# Patient Record
Sex: Female | Born: 1940
Health system: Southern US, Community
[De-identification: ages and names within clinical notes are randomized; demographics above are authoritative.]

## PROBLEM LIST (undated history)

## (undated) DIAGNOSIS — L02212 Cutaneous abscess of back [any part, except buttock]: Secondary | ICD-10-CM

## (undated) DIAGNOSIS — G459 Transient cerebral ischemic attack, unspecified: Secondary | ICD-10-CM

## (undated) DIAGNOSIS — H269 Unspecified cataract: Secondary | ICD-10-CM

## (undated) DIAGNOSIS — Z860101 Personal history of adenomatous and serrated colon polyps: Secondary | ICD-10-CM

## (undated) DIAGNOSIS — K37 Unspecified appendicitis: Secondary | ICD-10-CM

## (undated) DIAGNOSIS — K589 Irritable bowel syndrome without diarrhea: Secondary | ICD-10-CM

## (undated) DIAGNOSIS — I10 Essential (primary) hypertension: Secondary | ICD-10-CM

## (undated) DIAGNOSIS — L439 Lichen planus, unspecified: Secondary | ICD-10-CM

## (undated) DIAGNOSIS — Z87442 Personal history of urinary calculi: Secondary | ICD-10-CM

## (undated) DIAGNOSIS — I493 Ventricular premature depolarization: Secondary | ICD-10-CM

## (undated) DIAGNOSIS — M797 Fibromyalgia: Secondary | ICD-10-CM

## (undated) DIAGNOSIS — I6529 Occlusion and stenosis of unspecified carotid artery: Secondary | ICD-10-CM

## (undated) DIAGNOSIS — Z86010 Personal history of colonic polyps: Secondary | ICD-10-CM

## (undated) DIAGNOSIS — Z9289 Personal history of other medical treatment: Secondary | ICD-10-CM

## (undated) DIAGNOSIS — K5792 Diverticulitis of intestine, part unspecified, without perforation or abscess without bleeding: Secondary | ICD-10-CM

## (undated) DIAGNOSIS — I4729 Other ventricular tachycardia: Secondary | ICD-10-CM

## (undated) DIAGNOSIS — K279 Peptic ulcer, site unspecified, unspecified as acute or chronic, without hemorrhage or perforation: Secondary | ICD-10-CM

## (undated) DIAGNOSIS — B880 Other acariasis: Secondary | ICD-10-CM

## (undated) DIAGNOSIS — T7840XA Allergy, unspecified, initial encounter: Secondary | ICD-10-CM

## (undated) DIAGNOSIS — I340 Nonrheumatic mitral (valve) insufficiency: Secondary | ICD-10-CM

## (undated) DIAGNOSIS — I639 Cerebral infarction, unspecified: Secondary | ICD-10-CM

## (undated) DIAGNOSIS — K219 Gastro-esophageal reflux disease without esophagitis: Secondary | ICD-10-CM

## (undated) DIAGNOSIS — F419 Anxiety disorder, unspecified: Secondary | ICD-10-CM

## (undated) DIAGNOSIS — E785 Hyperlipidemia, unspecified: Secondary | ICD-10-CM

## (undated) DIAGNOSIS — Z87898 Personal history of other specified conditions: Secondary | ICD-10-CM

## (undated) DIAGNOSIS — E079 Disorder of thyroid, unspecified: Secondary | ICD-10-CM

## (undated) DIAGNOSIS — I351 Nonrheumatic aortic (valve) insufficiency: Secondary | ICD-10-CM

## (undated) HISTORY — DX: Diverticulitis of intestine, part unspecified, without perforation or abscess without bleeding: K57.92

## (undated) HISTORY — DX: Unspecified cataract: H26.9

## (undated) HISTORY — DX: Cutaneous abscess of back (any part, except buttock): L02.212

## (undated) HISTORY — DX: Essential (primary) hypertension: I10

## (undated) HISTORY — DX: Irritable bowel syndrome, unspecified: K58.9

## (undated) HISTORY — PX: WRIST SURGERY: SHX841

## (undated) HISTORY — DX: Personal history of adenomatous and serrated colon polyps: Z86.0101

## (undated) HISTORY — DX: Gastro-esophageal reflux disease without esophagitis: K21.9

## (undated) HISTORY — DX: Allergy, unspecified, initial encounter: T78.40XA

## (undated) HISTORY — PX: CARPAL TUNNEL RELEASE: SHX101

## (undated) HISTORY — DX: Personal history of other specified conditions: Z87.898

## (undated) HISTORY — DX: Fibromyalgia: M79.7

## (undated) HISTORY — DX: Disorder of thyroid, unspecified: E07.9

## (undated) HISTORY — DX: Ventricular premature depolarization: I49.3

## (undated) HISTORY — DX: Other acariasis: B88.0

## (undated) HISTORY — DX: Personal history of other medical treatment: Z92.89

## (undated) HISTORY — DX: Other ventricular tachycardia: I47.29

## (undated) HISTORY — DX: Personal history of colonic polyps: Z86.010

## (undated) HISTORY — DX: Hyperlipidemia, unspecified: E78.5

## (undated) HISTORY — DX: Unspecified appendicitis: K37

## (undated) HISTORY — PX: COLONOSCOPY: SHX174

## (undated) HISTORY — PX: APPENDECTOMY: SHX54

## (undated) HISTORY — DX: Nonrheumatic mitral (valve) insufficiency: I34.0

## (undated) HISTORY — DX: Transient cerebral ischemic attack, unspecified: G45.9

## (undated) HISTORY — DX: Cerebral infarction, unspecified: I63.9

## (undated) HISTORY — PX: ABDOMINAL HYSTERECTOMY: SHX81

## (undated) HISTORY — DX: Nonrheumatic aortic (valve) insufficiency: I35.1

## (undated) HISTORY — DX: Anxiety disorder, unspecified: F41.9

## (undated) HISTORY — DX: Occlusion and stenosis of unspecified carotid artery: I65.29

## (undated) HISTORY — DX: Peptic ulcer, site unspecified, unspecified as acute or chronic, without hemorrhage or perforation: K27.9

---

## 1999-05-10 ENCOUNTER — Other Ambulatory Visit: Admission: RE | Admit: 1999-05-10 | Discharge: 1999-05-10 | Payer: Self-pay | Admitting: Internal Medicine

## 1999-05-12 ENCOUNTER — Encounter (HOSPITAL_BASED_OUTPATIENT_CLINIC_OR_DEPARTMENT_OTHER): Payer: Self-pay | Admitting: Internal Medicine

## 1999-05-12 ENCOUNTER — Encounter: Admission: RE | Admit: 1999-05-12 | Discharge: 1999-05-12 | Payer: Self-pay | Admitting: Internal Medicine

## 2001-06-07 ENCOUNTER — Ambulatory Visit (HOSPITAL_COMMUNITY): Admission: RE | Admit: 2001-06-07 | Discharge: 2001-06-07 | Payer: Self-pay | Admitting: Internal Medicine

## 2001-06-07 ENCOUNTER — Encounter: Payer: Self-pay | Admitting: Internal Medicine

## 2002-05-23 ENCOUNTER — Encounter: Payer: Self-pay | Admitting: Internal Medicine

## 2002-05-23 ENCOUNTER — Encounter: Admission: RE | Admit: 2002-05-23 | Discharge: 2002-05-23 | Payer: Self-pay | Admitting: Internal Medicine

## 2002-08-02 ENCOUNTER — Ambulatory Visit (HOSPITAL_BASED_OUTPATIENT_CLINIC_OR_DEPARTMENT_OTHER): Admission: RE | Admit: 2002-08-02 | Discharge: 2002-08-03 | Payer: Self-pay | Admitting: *Deleted

## 2002-09-30 ENCOUNTER — Ambulatory Visit (HOSPITAL_BASED_OUTPATIENT_CLINIC_OR_DEPARTMENT_OTHER): Admission: RE | Admit: 2002-09-30 | Discharge: 2002-09-30 | Payer: Self-pay | Admitting: *Deleted

## 2004-10-19 ENCOUNTER — Inpatient Hospital Stay (HOSPITAL_COMMUNITY): Admission: EM | Admit: 2004-10-19 | Discharge: 2004-10-20 | Payer: Self-pay | Admitting: Emergency Medicine

## 2004-10-19 ENCOUNTER — Encounter (INDEPENDENT_AMBULATORY_CARE_PROVIDER_SITE_OTHER): Payer: Self-pay | Admitting: *Deleted

## 2004-11-03 ENCOUNTER — Encounter: Admission: RE | Admit: 2004-11-03 | Discharge: 2004-11-03 | Payer: Self-pay | Admitting: Internal Medicine

## 2005-11-15 ENCOUNTER — Encounter: Admission: RE | Admit: 2005-11-15 | Discharge: 2005-11-15 | Payer: Self-pay | Admitting: Internal Medicine

## 2007-04-27 ENCOUNTER — Encounter: Payer: Self-pay | Admitting: Internal Medicine

## 2007-06-07 ENCOUNTER — Encounter: Admission: RE | Admit: 2007-06-07 | Discharge: 2007-06-07 | Payer: Self-pay | Admitting: Internal Medicine

## 2007-07-17 DIAGNOSIS — Z860100 Personal history of colon polyps, unspecified: Secondary | ICD-10-CM | POA: Insufficient documentation

## 2007-07-17 DIAGNOSIS — K5732 Diverticulitis of large intestine without perforation or abscess without bleeding: Secondary | ICD-10-CM | POA: Insufficient documentation

## 2007-07-17 DIAGNOSIS — Z8719 Personal history of other diseases of the digestive system: Secondary | ICD-10-CM | POA: Insufficient documentation

## 2007-07-17 DIAGNOSIS — F411 Generalized anxiety disorder: Secondary | ICD-10-CM | POA: Insufficient documentation

## 2007-07-17 DIAGNOSIS — E7800 Pure hypercholesterolemia, unspecified: Secondary | ICD-10-CM | POA: Insufficient documentation

## 2007-07-17 DIAGNOSIS — M810 Age-related osteoporosis without current pathological fracture: Secondary | ICD-10-CM | POA: Insufficient documentation

## 2007-07-17 DIAGNOSIS — G479 Sleep disorder, unspecified: Secondary | ICD-10-CM | POA: Insufficient documentation

## 2007-07-17 DIAGNOSIS — K219 Gastro-esophageal reflux disease without esophagitis: Secondary | ICD-10-CM | POA: Insufficient documentation

## 2007-07-17 DIAGNOSIS — K279 Peptic ulcer, site unspecified, unspecified as acute or chronic, without hemorrhage or perforation: Secondary | ICD-10-CM | POA: Insufficient documentation

## 2007-07-17 DIAGNOSIS — I4949 Other premature depolarization: Secondary | ICD-10-CM | POA: Insufficient documentation

## 2007-07-17 DIAGNOSIS — I493 Ventricular premature depolarization: Secondary | ICD-10-CM | POA: Insufficient documentation

## 2007-07-18 ENCOUNTER — Ambulatory Visit: Payer: Self-pay | Admitting: Internal Medicine

## 2007-07-20 ENCOUNTER — Encounter: Payer: Self-pay | Admitting: Internal Medicine

## 2007-07-26 ENCOUNTER — Telehealth: Payer: Self-pay | Admitting: Internal Medicine

## 2007-08-03 ENCOUNTER — Encounter: Payer: Self-pay | Admitting: Internal Medicine

## 2007-08-03 ENCOUNTER — Ambulatory Visit: Payer: Self-pay | Admitting: Internal Medicine

## 2007-08-06 ENCOUNTER — Encounter: Payer: Self-pay | Admitting: Internal Medicine

## 2008-06-10 ENCOUNTER — Telehealth: Payer: Self-pay | Admitting: Internal Medicine

## 2008-11-14 ENCOUNTER — Encounter: Admission: RE | Admit: 2008-11-14 | Discharge: 2008-11-14 | Payer: Self-pay | Admitting: Internal Medicine

## 2008-11-26 ENCOUNTER — Encounter: Payer: Self-pay | Admitting: Cardiovascular Disease

## 2009-05-07 ENCOUNTER — Encounter: Payer: Self-pay | Admitting: Cardiovascular Disease

## 2009-05-12 ENCOUNTER — Encounter: Payer: Self-pay | Admitting: Cardiovascular Disease

## 2009-05-14 ENCOUNTER — Emergency Department (HOSPITAL_COMMUNITY): Admission: EM | Admit: 2009-05-14 | Discharge: 2009-05-15 | Payer: Self-pay | Admitting: Emergency Medicine

## 2009-05-18 ENCOUNTER — Ambulatory Visit: Payer: Self-pay | Admitting: Cardiovascular Disease

## 2009-05-18 DIAGNOSIS — R002 Palpitations: Secondary | ICD-10-CM | POA: Insufficient documentation

## 2009-05-27 ENCOUNTER — Telehealth: Payer: Self-pay | Admitting: Internal Medicine

## 2009-06-01 ENCOUNTER — Encounter: Payer: Self-pay | Admitting: Cardiovascular Disease

## 2009-06-01 ENCOUNTER — Ambulatory Visit: Payer: Self-pay

## 2009-06-01 ENCOUNTER — Ambulatory Visit (HOSPITAL_COMMUNITY): Admission: RE | Admit: 2009-06-01 | Discharge: 2009-06-01 | Payer: Self-pay | Admitting: Cardiovascular Disease

## 2009-06-01 ENCOUNTER — Ambulatory Visit: Payer: Self-pay | Admitting: Cardiovascular Disease

## 2009-06-01 ENCOUNTER — Ambulatory Visit: Payer: Self-pay | Admitting: Internal Medicine

## 2009-06-09 ENCOUNTER — Ambulatory Visit: Payer: Self-pay | Admitting: Cardiovascular Disease

## 2009-06-09 DIAGNOSIS — I429 Cardiomyopathy, unspecified: Secondary | ICD-10-CM | POA: Insufficient documentation

## 2009-06-17 ENCOUNTER — Ambulatory Visit: Payer: Self-pay | Admitting: Cardiovascular Disease

## 2009-06-17 DIAGNOSIS — I491 Atrial premature depolarization: Secondary | ICD-10-CM | POA: Insufficient documentation

## 2009-06-19 LAB — CONVERTED CEMR LAB
BUN: 11 mg/dL (ref 6–23)
CO2: 29 meq/L (ref 19–32)
Calcium: 8.5 mg/dL (ref 8.4–10.5)
Chloride: 105 meq/L (ref 96–112)
Glucose, Bld: 87 mg/dL (ref 70–99)

## 2009-06-25 ENCOUNTER — Ambulatory Visit: Payer: Self-pay | Admitting: Internal Medicine

## 2009-06-29 ENCOUNTER — Telehealth: Payer: Self-pay | Admitting: Internal Medicine

## 2009-07-27 ENCOUNTER — Encounter: Payer: Self-pay | Admitting: Internal Medicine

## 2009-10-27 ENCOUNTER — Telehealth (INDEPENDENT_AMBULATORY_CARE_PROVIDER_SITE_OTHER): Payer: Self-pay | Admitting: *Deleted

## 2009-12-09 ENCOUNTER — Ambulatory Visit (HOSPITAL_COMMUNITY): Admission: RE | Admit: 2009-12-09 | Discharge: 2009-12-09 | Payer: Self-pay | Admitting: Cardiovascular Disease

## 2009-12-09 ENCOUNTER — Ambulatory Visit: Payer: Self-pay | Admitting: Cardiovascular Disease

## 2009-12-09 ENCOUNTER — Ambulatory Visit: Payer: Self-pay

## 2009-12-09 ENCOUNTER — Encounter: Payer: Self-pay | Admitting: Cardiovascular Disease

## 2009-12-14 ENCOUNTER — Encounter: Admission: RE | Admit: 2009-12-14 | Discharge: 2009-12-14 | Payer: Self-pay | Admitting: Internal Medicine

## 2009-12-21 ENCOUNTER — Ambulatory Visit: Payer: Self-pay | Admitting: Cardiovascular Disease

## 2010-03-25 NOTE — Procedures (Signed)
Summary: summary  summary   Imported By: Mirna Mires 06/04/2009 16:10:48  _____________________________________________________________________  External Attachment:    Type:   Image     Comment:   External Document

## 2010-03-25 NOTE — Progress Notes (Signed)
Summary: Question concerning PVCs   Phone Note Call from Patient Call back at Home Phone [phone]   Caller: Patient Reason for Call: Talk to Doctor Summary of Call: Returned a phone call to the patient who was c/o being cold in her hands and feet as well as feeling her PVCs.  She stated that she got in bed to calm down, because she felt she was anxoius.  By the time I spoke with her, her extremity temperature had improved and her anxiety was clearing.  She states that prior to the last couple of days she has been doing well with her PVCs and anxiety feeling.  She has been taking her BP (all WNL) and pulses, ranging 60-85.  I reassured her that her VS were stable and that if her sympotms became worse or she started having CP or SOB then she could follow up as an outpatient.  She is suppose to see CM in October,  if her symptoms persist she will call to reschedule her appointment.  She voiced understanding and truly appreciated the call back.   Initial call taken by: Robbi Garter NP-PA,  October 27, 2009 8:44 PM

## 2010-03-25 NOTE — Miscellaneous (Signed)
Summary: Nexium Approval-[employer]  Case ID: [id number] Member Number: [id number] Case Type: Initial Review Case Start Date: 07/27/2009 Case Status: Coverage has been APPROVED. You will receive a confirmation letter confirming approval of this medication. The patient will also be notified of this approval via an automated outbound phone call or a letter. Please allow approximately 2 hours to update our system with the approval. Once updated, the prescription can be re-submitted.   Coverage Start Date: 07/06/2009 Coverage End Date: 07/27/2010  Patient First Name: Kaitlyn Patient Last Name: Mendoza DOB: 22-Apr-1940 Patient Street Address: [address]   Patient City: [place] Patient State: [place] Patient Zip: [phone]  Drug Name & Strength: Nexium 20 Mg

## 2010-03-25 NOTE — Assessment & Plan Note (Signed)
Summary: 2-3 [email or web address]   Visit Type:  2-3 wks f/u Primary Provider:  Dr. Rodrigo Ran  CC:  pt states she is feeling better...no cardiac complaints today.  History of Present Illness: 70 yo WF with history of hyperlipidemia, HTN, multiple GI issues including divericulosis, PUD and GERD along with IBS and fibromyalgia who is here today for cardiac follow up. She was seen as a new patient for further evaluation of palpitations on 05/18/09. . She has had known PVCs for many years. She was recently seen by Dr. Waynard Edwards several weeks ago for abdominal pain and her BP was elevated. She was told to monitor her HR and blood pressure at home. Her HR was in the 40s to 50s at home. An EKG was unchanged in Dr. Therisa Doyne office. Her metoprolol was increased from 25 mg two times a day to 50 mg by mouth two times a day. After the medication change, she started to have palpitations and her anxiety level increased and she felt dizzy. She was seen in [facility] ED on Thursday March 24th and was told that her heart rhythm was ok. There was mild chest pain last week but this was relieved with belching. She has had no prior ischemic testing. Thyroid studies from 05/04/09: TSH 4.35, free T4 1.2, total T3 136.9. At the first visit, I ordered an echo and a 48 hour Holter monitor. Her echo showed mild global hypokinesis with EF of 45-50%. Her monitor showed frequent PVCs with some trigeminy and bigeminy.   Since I saw her, she has stopped all caffeine. Her beta blocker has been titrated and she is feeling much better. She has not felt palpitations over the last few weeks. She is exercising without any fatigue. No lower ext edema. No near syncope or syncope.   Current Medications (verified): 1)  Evista 60 Mg  Tabs (Raloxifene Hcl) .... Once Daily 2)  Doxepin Hcl 10 Mg  Caps (Doxepin Hcl) .... 2 Capsules At Bedtime 3)  Metoprolol Tartrate 50 Mg Tabs (Metoprolol Tartrate) .... Take One Tablet By Mouth Twice A Day 4)  Vitamin D  (Ergocalciferol) 50000 Unit Caps (Ergocalciferol) .... Weekly 5)  Simvastatin 40 Mg  Tabs (Simvastatin) .... Once Daily 6)  Bentyl 10 Mg  Caps (Dicyclomine Hcl) .... Take 1 Tablet By Mouth Every 6 Hours As Needed For Abdominal Pain 7)  Nexium 20 Mg  Cpdr (Esomeprazole Magnesium) .... Take 1 Tablet By Mouth Once A Day. 8)  Align  Caps (Probiotic Product) .... Once Daily 9)  Caltrate 600 1500 Mg Tabs (Calcium Carbonate) .... 2 Tabs Daily  Allergies: 1)  ! Sulfa 2)  ! Pcn  Past History:  Past Medical History: Reviewed history from 05/18/2009 and no changes required. Hx of PREMATURE VENTRICULAR CONTRACTIONS (ICD-427.69) HYPERCHOLESTEROLEMIA (ICD-272.0) OSTEOPOROSIS (ICD-733.00) ANXIETY (ICD-300.00) SLEEP DISORDER (ICD-780.50) PUD (ICD-533.90) APPENDICITIS, HX OF (ICD-V12.79) COLONIC POLYPS, HX OF (ICD-V12.72) DIVERTICULITIS OF COLON (ICD-562.11) GERD (ICD-530.81) Irritable bowel syndrome Fibromyalgia HTN  Past Surgical History: Laparoscopic appendectomy 9/06 Hysterectomy 1941 Broken left wrist now s/p  repair 2004 Carpal tunnel surgery left wrist 2004  Social History: Reviewed history from 05/18/2009 and no changes required. 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 Widow, no children Patient has never smoked.  Alcohol Use - no Illicit Drug Use - no  Review of Systems  The patient denies fatigue, malaise, fever, weight gain/loss, vision loss, decreased hearing, hoarseness, chest pain, palpitations, shortness of breath, prolonged cough, wheezing, sleep apnea, coughing up blood,  abdominal pain, blood in stool, nausea, vomiting, diarrhea, heartburn, incontinence, blood in urine, muscle weakness, joint pain, leg swelling, rash, skin lesions, headache, fainting, dizziness, depression, anxiety, enlarged lymph nodes, easy bruising or bleeding, and environmental allergies.    Vital Signs:  Patient profile:   70 year old female Height:       63 inches Weight:      175 pounds BMI:     31.11 Pulse rate:   68 / minute Pulse rhythm:   regular BP sitting:   126 / 68  (left arm) Cuff size:   regular  Vitals Entered By: Danielle Rankin, CMA (June 09, 2009 11:40 AM)  Physical Exam  General:  General: Well developed, well nourished, NAD HEENT: OP clear, mucus membranes moist Psychiatric: Mood and affect normal Neck: No JVD, no carotid bruits, no thyromegaly, no lymphadenopathy. Lungs:Clear bilaterally, no wheezes, rhonci, crackles CV: RRR no murmurs, gallops rubs Abdomen: soft, NT, ND, BS present Extremities: No edema, pulses 2+.    Echocardiogram  Procedure date:  06/01/2009  Findings:      Left ventricle: LVEF is approximately 45 to 50% with mild diffuse     hypokineisis. The cavity size was normal. Wall thickness was normal.  Holter Monitor  Procedure date:  06/01/2009  Findings:      NSR with frequent PVCs with some trigeminy and bigeminy. NO VT or SVT.   Impression & Recommendations:  Problem # 1:  PREMATURE VENTRICULAR CONTRACTIONS (ICD-427.69) Much improved since her beta blocker was increased and she cut back on the caffeine. I would continue the beta blocker at the current dose.  She will let us know if her symptoms return.   Her updated medication list for this problem includes:    Metoprolol Tartrate 50 Mg Tabs (Metoprolol tartrate) .Marland Kitchen... Take one tablet by mouth twice a day    Lisinopril 5 Mg Tabs (Lisinopril) .Marland Kitchen... Take one tablet by mouth daily  Her updated medication list for this problem includes:    Metoprolol Tartrate 50 Mg Tabs (Metoprolol tartrate) .Marland Kitchen... Take one tablet by mouth twice a day    Lisinopril 5 Mg Tabs (Lisinopril) .Marland Kitchen... Take one tablet by mouth daily  Problem # 2:  CARDIOMYOPATHY, SECONDARY (ICD-425.9) Her LV systolic function is mildly depressed/low normal. No other structural abnormalities. This may be related to her frequent PVCs. I will start her on Lisinopril 5 mg by mouth  once daily. I will repeat her echo in 6 months.   Her updated medication list for this problem includes:    Metoprolol Tartrate 50 Mg Tabs (Metoprolol tartrate) .Marland Kitchen... Take one tablet by mouth twice a day    Lisinopril 5 Mg Tabs (Lisinopril) .Marland Kitchen... Take one tablet by mouth daily  Patient Instructions: 1)  Your physician recommends that you schedule a follow-up appointment in: 6 months 2)  Your physician recommends that you return for lab work on June 17, 2009   3)  Your physician has recommended you make the following change in your medication: Start lisinopril 5 mg by mouth daily 4)  Your physician has requested that you have an echocardiogram.  Echocardiography is a painless test that uses sound waves to create images of your heart. It provides your doctor with information about the size and shape of your heart and how well your heart's chambers and valves are working.  This procedure takes approximately one hour. There are no restrictions for this procedure. To be done in 6 months prior to office visit with Dr. Clifton James  Prescriptions: LISINOPRIL 5 MG TABS (LISINOPRIL) Take one tablet by mouth daily  #30 x 11   Entered by:   Dossie Arbour, RN, BSN   Authorized by:   Verne Carrow, MD   Signed by:   Dossie Arbour, RN, BSN on 06/09/2009   Method used:   Electronically to        [facility]  [phone]* (retail)       [address] [place], [place]  [address]       Ph: [phone] or [phone]       Fax: [phone]   RxID:   [id number]

## 2010-03-25 NOTE — Progress Notes (Signed)
Summary: rx was not called in  Phone Note Call from Patient Call back at Home Phone [phone]   Caller: Patient Call For: Juanda Chance Reason for Call: Talk to Nurse Summary of Call: Patient states that her rx was not called in to her pharmacy and she needs her proescriptions. [facility] [address] Nexium and Dicyclomine Initial call taken by: Tawni Levy,  Jun 29, 2009 2:57 PM  Follow-up for Phone Call        I was unaware that she was out of these medications. I will send new prescription. Although Dr Regino Schultze note indicates that the patient was taking protonix, patient says she doesnt know what protonix is and has always taken Nexium. I will refill Nexium for patient. Follow-up by: Lamona Curl CMA Duncan Dull),  Jun 29, 2009 3:12 PM    Prescriptions: BENTYL 10 MG  CAPS (DICYCLOMINE HCL) Take 1 tablet by mouth every 6 hours as needed for abdominal pain  #60 x 2   Entered by:   Lamona Curl CMA (AAMA)   Authorized by:   Hart Carwin MD   Signed by:   Lamona Curl CMA (AAMA) on 06/29/2009   Method used:   Electronically to        [facility]  [phone]* (retail)       [address] [place], [place]  [address]       Ph: [phone] or [phone]       Fax: [phone]   RxID:   [id number] NEXIUM 20 MG  CPDR (ESOMEPRAZOLE MAGNESIUM) Take 1 tablet by mouth once a day.  #30 x 4   Entered by:   Lamona Curl CMA (AAMA)   Authorized by:   Hart Carwin MD   Signed by:   Lamona Curl CMA (AAMA) on 06/29/2009   Method used:   Electronically to        [facility]  [phone]* (retail)       [address] [place], [place]  [address]       Ph: [phone] or [phone]       Fax: [phone]   RxID:   [id number]

## 2010-03-25 NOTE — Assessment & Plan Note (Signed)
Summary: 6 month follow up/427.6,425.9/pla   Visit Type:  6 mo f/u Referring Provider:  na Primary Provider:  Dr. Rodrigo Ran  CC:  pt denies any cardiac complaints today..ptr states her insides feel like jelllo at times, not sure if BP is too high or too low, and says this happens after she has driven in her car for a long distance....pt describes some episodes of bilateral face numbness at times right after her mother had passed away in 08/25/22...pt c/o extreme hand coldness.Marland KitchenMarland KitchenMarland KitchenMarland Kitchenpt c/o some dizziness at times while walking her dog...pt states she had called our office and spoke w/On Call Dr. who talked her through an episode and said this helped her..pt not sure if she is having anxiety attacks.  History of Present Illness: 70 yo WF with history of hyperlipidemia, HTN, multiple GI issues including divericulosis, PUD and GERD along with IBS and fibromyalgia who is here today for cardiac follow up. She was seen as a new patient for further evaluation of palpitations on 05/18/09. . She has had known PVCs for many years. She was recently seen by Dr. Waynard Edwards this spring  for abdominal pain and her BP was elevated. She was told to monitor her HR and blood pressure at home. Her HR was in the 40s to 50s at home. An EKG was unchanged in Dr. Therisa Doyne office. Her metoprolol was increased from 25 mg two times a day to 50 mg by mouth two times a day. After the medication change, she started to have palpitations and her anxiety level increased and she felt dizzy. She was seen in [facility] ED on Thursday March 24th, 2011 and was told that her heart rhythm was ok. She has had no prior ischemic testing. Thyroid studies from 05/04/09: TSH 4.35, free T4 1.2, total T3 136.9. At the first visit, I ordered an echo and a 48 hour Holter monitor. Her echo showed mild global hypokinesis with EF of 45-50%. Her monitor showed frequent PVCs with some trigeminy and bigeminy. Her beta blocker was increased and she cut out caffeine and felt  much better. Rare palpitations.   She is here today for follow up.  She has been having episodes where she feels vibrations on her insides. This is not painful. No relation to anxiety or episodes of panic attacks. No associated nausea, vomiting or fevers.   Current Medications (verified): 1)  Evista 60 Mg  Tabs (Raloxifene Hcl) .... Once Daily 2)  Doxepin Hcl 10 Mg  Caps (Doxepin Hcl) .... 2 Capsules At Bedtime 3)  Metoprolol Tartrate 50 Mg Tabs (Metoprolol Tartrate) .... Take One Tablet By Mouth Twice A Day 4)  Vitamin D (Ergocalciferol) 50000 Unit Caps (Ergocalciferol) .... Weekly 5)  Simvastatin 40 Mg  Tabs (Simvastatin) .... Once Daily 6)  Bentyl 10 Mg  Caps (Dicyclomine Hcl) .... Take 1 Tablet By Mouth Every 6 Hours As Needed For Abdominal Pain 7)  Nexium 20 Mg  Cpdr (Esomeprazole Magnesium) .... Take 1 Tablet By Mouth Once A Day. 8)  Align  Caps (Probiotic Product) .... Once Daily 9)  Caltrate 600 1500 Mg Tabs (Calcium Carbonate) .... 2 Tabs Daily 10)  Lisinopril 5 Mg Tabs (Lisinopril) .... Take One Tablet By Mouth Daily 11)  Dicyclomine Hcl 10 Mg Caps (Dicyclomine Hcl) .Marland Kitchen.. 1 Capsule By Mouth As Needed Pain  Allergies: 1)  ! Sulfa 2)  ! Pcn  Past History:  Past Medical History: Hx of PREMATURE VENTRICULAR CONTRACTIONS (ICD-427.69) and PACs.  HYPERCHOLESTEROLEMIA (ICD-272.0) OSTEOPOROSIS (ICD-733.00) ANXIETY (ICD-300.00)  SLEEP DISORDER (ICD-780.50) PUD (ICD-533.90) APPENDICITIS, HX OF (ICD-V12.79) COLONIC POLYPS, HX OF (ICD-V12.72) DIVERTICULITIS OF COLON (ICD-562.11) GERD (ICD-530.81) Irritable bowel syndrome Fibromyalgia HTN  Social History: Reviewed history from 05/18/2009 and no changes required. 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 Widow, no children Patient has never smoked.  Alcohol Use - no Illicit Drug Use - no  Review of Systems  The patient denies fatigue, malaise, fever, weight gain/loss, vision loss,  decreased hearing, hoarseness, chest pain, palpitations, shortness of breath, prolonged cough, wheezing, sleep apnea, coughing up blood, abdominal pain, blood in stool, nausea, vomiting, diarrhea, heartburn, incontinence, blood in urine, muscle weakness, joint pain, leg swelling, rash, skin lesions, headache, fainting, dizziness, depression, anxiety, enlarged lymph nodes, easy bruising or bleeding, and environmental allergies.         See HPI.   Vital Signs:  Patient profile:   70 year old female Height:      63 inches Weight:      168.12 pounds BMI:     29.89 Pulse rate:   66 / minute Pulse rhythm:   regular BP sitting:   118 / 78  (left arm) Cuff size:   regular  Vitals Entered By: Danielle Rankin, CMA (December 21, 2009 10:13 AM)  Physical Exam  General:  General: Well developed, well nourished, NAD HEENT: OP clear, mucus membranes moist Musculoskeletal: Muscle strength 5/5 all ext Psychiatric: Mood and affect normal Neck: No JVD, no carotid bruits, no thyromegaly, no lymphadenopathy. Lungs:Clear bilaterally, no wheezes, rhonci, crackles CV: RRR no murmurs, gallops rubs Abdomen: soft, NT, ND, BS present Extremities: No edema, pulses 2+.    Echocardiogram  Procedure date:  12/09/2009  Findings:      - Left ventricle: Systolic function was mildly reduced. The       estimated ejection fraction was in the range of 45% to 50%.     - Mitral valve: Prolapse.     - Left atrium: The atrium was mildly dilated.  Impression & Recommendations:  Problem # 1:  ATRIAL PREMATURE BEATS (ICD-427.61) Much better on higher dose of Metoprolol. Continue to avoid caffeine.   Her updated medication list for this problem includes:    Metoprolol Tartrate 50 Mg Tabs (Metoprolol tartrate) .Marland Kitchen... Take one tablet by mouth twice a day    Lisinopril 5 Mg Tabs (Lisinopril) .Marland Kitchen... Take one tablet by mouth daily  Problem # 2:  PREMATURE VENTRICULAR CONTRACTIONS (ICD-427.69) See above.   Her updated  medication list for this problem includes:    Metoprolol Tartrate 50 Mg Tabs (Metoprolol tartrate) .Marland Kitchen... Take one tablet by mouth twice a day    Lisinopril 5 Mg Tabs (Lisinopril) .Marland Kitchen... Take one tablet by mouth daily  Her updated medication list for this problem includes:    Metoprolol Tartrate 50 Mg Tabs (Metoprolol tartrate) .Marland Kitchen... Take one tablet by mouth twice a day    Lisinopril 5 Mg Tabs (Lisinopril) .Marland Kitchen... Take one tablet by mouth daily  Problem # 3:  CARDIOMYOPATHY, SECONDARY (ICD-425.9) Question etiology but EF stable, mildly depressed. Continue beta blocker and Ace-inhibitor.   Her updated medication list for this problem includes:    Metoprolol Tartrate 50 Mg Tabs (Metoprolol tartrate) .Marland Kitchen... Take one tablet by mouth twice a day    Lisinopril 5 Mg Tabs (Lisinopril) .Marland Kitchen... Take one tablet by mouth daily  Her updated medication list for this problem includes:    Metoprolol Tartrate 50 Mg Tabs (Metoprolol tartrate) .Marland Kitchen... Take one tablet by mouth twice  a day    Lisinopril 5 Mg Tabs (Lisinopril) .Marland Kitchen... Take one tablet by mouth daily  Patient Instructions: 1)  Your physician recommends that you schedule a follow-up appointment in: 1 year 2)  Your physician recommends that you continue on your current medications as directed. Please refer to the Current Medication list given to you today.

## 2010-03-25 NOTE — Assessment & Plan Note (Signed)
Summary: np6/pvc's   Visit Type:  Follow-up Primary Provider:  Dr. Rodrigo Ran  CC:  palpitations.  History of Present Illness: 70 yo WF with history of hyperlipidemia, HTN, multiple GI issues including divericulosis, PUD and GERD along with IBS and fibromyalgia who is here today as a new patient for further evaluation of palpitations. She has had known PVCs for many years. She was recently seen by Dr. Waynard Edwards last week for abdominal pain and her BP was elevated. She was told to monitor her HR and blood pressure at home. Her HR was in the 40s to 50s at home. An EKG was unchanged in Dr. Therisa Doyne office. Her metoprolo was increased from 25 mg two times a day to 50 mg by mouth two times a day. After the medication change, she started to have palpitations and her anxiety level increased and she felt dizzy. She was seen in [facility] ED on Thursday March 24th and was told that her heart rhythm was ok. There was mild chest pain last week but this was relieved with belching. She has had no prior ischemic testing.   Thyroid studies from 05/04/09: TSH 4.35, free T4 1.2, total T3 136.9.  Current Medications (verified): 1)  Evista 60 Mg  Tabs (Raloxifene Hcl) .... Once Daily 2)  Doxepin Hcl 10 Mg  Caps (Doxepin Hcl) .... 2 Capsules At Bedtime 3)  Metoprolol Tartrate 50 Mg Tabs (Metoprolol Tartrate) .... Take One Tablet By Mouth Twice A Day 4)  Vitamin D (Ergocalciferol) 50000 Unit Caps (Ergocalciferol) .... Weekly 5)  Simvastatin 40 Mg  Tabs (Simvastatin) .... Once Daily 6)  Bentyl 10 Mg  Caps (Dicyclomine Hcl) .... Take 1 Tablet By Mouth Every 6 Hours As Needed For Abdominal Pain 7)  Nexium 20 Mg  Cpdr (Esomeprazole Magnesium) .... Take 1 Tablet By Mouth Once A Day. 8)  Align  Caps (Probiotic Product) .... Once Daily 9)  Caltrate 600 1500 Mg Tabs (Calcium Carbonate) .... 2 Tabs Daily  Allergies: 1)  ! Sulfa 2)  ! Pcn  Past History:  Past Medical History: Hx of PREMATURE VENTRICULAR CONTRACTIONS  (ICD-427.69) HYPERCHOLESTEROLEMIA (ICD-272.0) OSTEOPOROSIS (ICD-733.00) ANXIETY (ICD-300.00) SLEEP DISORDER (ICD-780.50) PUD (ICD-533.90) APPENDICITIS, HX OF (ICD-V12.79) COLONIC POLYPS, HX OF (ICD-V12.72) DIVERTICULITIS OF COLON (ICD-562.11) GERD (ICD-530.81) Irritable bowel syndrome Fibromyalgia HTN  Past Surgical History: Laparoscopic appendectomy 9/06 Hysterectomy 1941 Broken left wristnow s/p  repair 2004 Carpal tunnel surgery left wrist 2004  Family History: Reviewed history from 07/17/2007 and no changes required. Mother-alive at 72, Alzheimer's Father CAD age 34's, died of lung cancer Maternal grandfather MI age 75 Brother with diabetes, lung cancer  Social History: Reviewed history from 07/18/2007 and no changes required. 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 Widow, no children Patient has never smoked.  Alcohol Use - no Illicit Drug Use - no  Review of Systems       The patient complains of fatigue, chest pain, and palpitations.  The patient denies malaise, fever, weight gain/loss, vision loss, decreased hearing, hoarseness, shortness of breath, prolonged cough, wheezing, sleep apnea, coughing up blood, abdominal pain, blood in stool, nausea, vomiting, diarrhea, heartburn, incontinence, blood in urine, muscle weakness, joint pain, leg swelling, rash, skin lesions, headache, fainting, dizziness, depression, anxiety, enlarged lymph nodes, easy bruising or bleeding, and environmental allergies.    Vital Signs:  Patient profile:   70 year old female Height:      63 inches Weight:      175 pounds BMI:  31.11 Pulse rate:   68 / minute BP sitting:   130 / 80  (left arm)  Vitals Entered By: Laurance Flatten CMA (May 18, 2009 10:32 AM)  Physical Exam  General:  General: Well developed, well nourished, NAD HEENT: OP clear, mucus membranes moist SKIN: warm, dry Neuro: No focal deficits Musculoskeletal: Muscle strength  5/5 all ext Psychiatric: Mood and affect normal Neck: No JVD, no carotid bruits, no thyromegaly, no lymphadenopathy. Lungs:Clear bilaterally, no wheezes, rhonci, crackles CV: RRR no murmurs, gallops rubs Abdomen: soft, NT, ND, BS present Extremities: No edema, pulses 2+.    EKG  Procedure date:  05/18/2009  Findings:      Sinus rhythm, rate 68 bpm. PVC. Incomplete RBBB.   Impression & Recommendations:  Problem # 1:  PALPITATIONS (ICD-785.1) Her palpitations are most likely secondary to premature beats as documented on the EKG today and in the past. I agree with her current dose of Metoprolol as prescribed by Dr. Waynard Edwards. I will have her wear a 48 hour Holter monitor to quantitate the number of premature beats and rule out any ventricular arrhythmias or atrial tachycardias as the cause of her palpitations. Will also check an echo to rule out structural heart disease. Recent thyroid studies reviewed and ok.   Her updated medication list for this problem includes:    Metoprolol Tartrate 50 Mg Tabs (Metoprolol tartrate) .Marland Kitchen... Take one tablet by mouth twice a day  Other Orders: EKG w/ Interpretation (93000) Echocardiogram (Echo) Holter Monitor (Holter Monitor)  Patient Instructions: 1)  Your physician recommends that you schedule a follow-up appointment in: 2-3 weeks 2)  Your physician has requested that you have an echocardiogram.  Echocardiography is a painless test that uses sound waves to create images of your heart. It provides your doctor with information about the size and shape of your heart and how well your heart's chambers and valves are working.  This procedure takes approximately one hour. There are no restrictions for this procedure. 3)  Your physician has recommended that you wear a holter monitor.  Holter monitors are medical devices that record the heart's electrical activity. Doctors most often use these monitors to diagnose arrhythmias. Arrhythmias are problems with the  speed or rhythm of the heartbeat. The monitor is a small, portable device. You can wear one while you do your normal daily activities. This is usually used to diagnose what is causing palpitations/syncope (passing out).

## 2010-03-25 NOTE — Progress Notes (Signed)
Summary: [facility] Office Note  [facility] Office Note   Imported By: Roderic Ovens 08/12/2009 09:55:41  _____________________________________________________________________  External Attachment:    Type:   Image     Comment:   External Document

## 2010-03-25 NOTE — Progress Notes (Signed)
Summary: rx called in  Phone Note Call from Patient Call back at Home Phone [phone]   Caller: Patient Call For: Juanda Chance Reason for Call: Refill Medication Summary of Call: Patient scheduled her rev for rx renewal but needs refills until her appt date (5-5) [facility] [place]. Initial call taken by: Tawni Levy,  May 27, 2009 1:02 PM  Follow-up for Phone Call        Rx was sent to pts pharmacy and pt notified to keep appt in May for any further refills. Pt agreed and verbalized understanding.  Follow-up by: Christie Nottingham CMA Duncan Dull),  May 27, 2009 2:47 PM    New/Updated Medications: NEXIUM 20 MG  CPDR (ESOMEPRAZOLE MAGNESIUM) Take 1 tablet by mouth once a day. Prescriptions: NEXIUM 20 MG  CPDR (ESOMEPRAZOLE MAGNESIUM) Take 1 tablet by mouth once a day.  #30 x 0   Entered by:   Christie Nottingham CMA (AAMA)   Authorized by:   Hart Carwin MD   Signed by:   Christie Nottingham CMA (AAMA) on 05/27/2009   Method used:   [facility] to        [facility]  [phone]* (retail)       [address] [place], [place]  [address]       Ph: [phone] or [phone]       Fax: [phone]   RxID:   [id number]

## 2010-03-25 NOTE — Assessment & Plan Note (Signed)
Summary: rx renewal...em   History of Present Illness Visit Type: Follow-up Visit Primary GI MD: Lina Sar MD Primary Provider: Dr. Rodrigo Ran Requesting Provider: n/a Chief Complaint: GERD, RLQ pain, IBS with diarrhea  Needs refills History of Present Illness:   This is a 70 year old white female with a history of irritable bowel syndrome who comes to renew her  Nexium 40 mg daily. Her last upper endoscopy was in June 2009 and a prior endoscopy in 2003 proved patient to be positive for H. pylori. She underwent treatment for H.Pylori.Marland Kitchen Her last colonoscopy in 2009 and again prior to that in 1999 showed 2 adenomatous cecal polyps and diverticulosis. She is due for a repeat colonoscopy in 2014. There is a family history of colon cancer in a grandparent. She was seen recently by Dr Waynard Edwards  for right lower quadrant abdominal pain and was treated for 10 days with Flagyl for presumed diverticulitis. The pain is much improved. She denies being constipated or having any fever. She has been under a great deal of stress taking care of her mother with Alzheimer's disease.   GI Review of Systems    Reports abdominal pain.     Location of  Abdominal pain: RLQ.    Denies acid reflux, belching, bloating, chest pain, dysphagia with liquids, dysphagia with solids, heartburn, loss of appetite, nausea, vomiting, vomiting blood, weight loss, and  weight gain.      Reports diarrhea and  irritable bowel syndrome.     Denies anal fissure, black tarry stools, change in bowel habit, constipation, diverticulosis, fecal incontinence, heme positive stool, hemorrhoids, jaundice, light color stool, liver problems, rectal bleeding, and  rectal pain.    Current Medications (verified): 1)  Evista 60 Mg  Tabs (Raloxifene Hcl) .... Once Daily 2)  Doxepin Hcl 10 Mg  Caps (Doxepin Hcl) .... 2 Capsules At Bedtime 3)  Metoprolol Tartrate 50 Mg Tabs (Metoprolol Tartrate) .... Take One Tablet By Mouth Twice A Day 4)  Vitamin D  (Ergocalciferol) 50000 Unit Caps (Ergocalciferol) .... Weekly 5)  Simvastatin 40 Mg  Tabs (Simvastatin) .... Once Daily 6)  Bentyl 10 Mg  Caps (Dicyclomine Hcl) .... Take 1 Tablet By Mouth Every 6 Hours As Needed For Abdominal Pain 7)  Nexium 20 Mg  Cpdr (Esomeprazole Magnesium) .... Take 1 Tablet By Mouth Once A Day. 8)  Align  Caps (Probiotic Product) .... Once Daily 9)  Caltrate 600 1500 Mg Tabs (Calcium Carbonate) .... 2 Tabs Daily 10)  Lisinopril 5 Mg Tabs (Lisinopril) .... Take One Tablet By Mouth Daily 11)  Dicyclomine Hcl 10 Mg Caps (Dicyclomine Hcl) .Marland Kitchen.. 1 Capsule By Mouth As Needed Pain  Allergies (verified): 1)  ! Sulfa 2)  ! Pcn  Past History:  Past Medical History: Reviewed history from 05/18/2009 and no changes required. Hx of PREMATURE VENTRICULAR CONTRACTIONS (ICD-427.69) HYPERCHOLESTEROLEMIA (ICD-272.0) OSTEOPOROSIS (ICD-733.00) ANXIETY (ICD-300.00) SLEEP DISORDER (ICD-780.50) PUD (ICD-533.90) APPENDICITIS, HX OF (ICD-V12.79) COLONIC POLYPS, HX OF (ICD-V12.72) DIVERTICULITIS OF COLON (ICD-562.11) GERD (ICD-530.81) Irritable bowel syndrome Fibromyalgia HTN  Past Surgical History: Reviewed history from 06/09/2009 and no changes required. Laparoscopic appendectomy 9/06 Hysterectomy 1941 Broken left wrist now s/p  repair 2004 Carpal tunnel surgery left wrist 2004  Family History: Reviewed history from 05/18/2009 and no changes required. Mother-alive at 6, Alzheimer's Father CAD age 26's, died of lung cancer Maternal grandfather MI age 53 Brother with diabetes, lung cancer Family History of Colon Cancer:PGM  Social History: Reviewed history from 05/18/2009 and no changes required. 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 Widow, no children Patient has never smoked.  Alcohol Use - no Illicit Drug Use - no  Review of Systems  The patient denies allergy/sinus, anemia, anxiety-new, arthritis/joint pain, back  pain, blood in urine, breast changes/lumps, change in vision, confusion, cough, coughing up blood, depression-new, fainting, fatigue, fever, headaches-new, hearing problems, heart murmur, heart rhythm changes, itching, menstrual pain, muscle pains/cramps, night sweats, nosebleeds, pregnancy symptoms, shortness of breath, skin rash, sleeping problems, sore throat, swelling of feet/legs, swollen lymph glands, thirst - excessive , urination - excessive , urination changes/pain, urine leakage, vision changes, and voice change.         Pertinent positive and negative review of systems were noted in the above HPI. All other ROS was otherwise negative.   Vital Signs:  Patient profile:   70 year old female Height:      63 inches Weight:      175 pounds BMI:     31.11 Pulse rate:   76 / minute Pulse rhythm:   regular BP sitting:   132 / 80  (right arm)  Vitals Entered By: Ok Anis CMA (Jun 25, 2009 3:18 PM)  Physical Exam  General:  Well developed, well nourished, no acute distress. Mouth:  No deformity or lesions, dentition normal. Neck:  Supple; no masses or thyromegaly. Lungs:  Clear throughout to auscultation. Heart:  Regular rate and rhythm; no murmurs, rubs,  or bruits. Abdomen:  soft, relaxed abdomen with normoactive bowel sounds and no distention. Mild tenderness in right upper quadrant on deep pressure but no fullness or mass. Left lower quadrant is unremarkable. Skin:  Intact without significant lesions or rashes. Psych:  Alert and cooperative. Normal mood and affect.   Impression & Recommendations:  Problem # 1:  DIVERTICULITIS OF COLON (ICD-562.11) Patient has symptomatic diverticulosis of the left colon with pain in the right lower quadrant which could be related indirectly to diverticulosis or possibly due to irritable bowel syndrome. There is no evidence of a hernia. There may be intra-abdominal pelvic adhesions which may be causing right lower quadrant discomfort which since  then has improved. Since she is better, we will ask her to take Bentyl 10 mg 2-3 times a day as needed and if the pain recurs, consider a CT scan of the abdomen with attention to the right lower quadrant.  Problem # 2:  COLONIC POLYPS, HX OF (ICD-V12.72) A recall colonoscopy will be due in 2014 for followup of adenomatous polyps.  Problem # 3:  GERD (ICD-530.81) Patient's reflux has been well controlled on Protonix 40 mg daily. We will refill her medications today.  Patient Instructions: 1)  resume Protonix 40 mg daily. 2)  Refill Dicyclomine 10 mg p.o. t.i.d. 3)  High-fiber diet. 4)  Recall colonoscopy 2014. 5)  If pain in the right lower quadrant recurs, consider CT scan of the abdomen and pelvis with attention to right lower quadrant. 6)  Copy sent to :Dr Waynard Edwards 7)  The medication list was reviewed and reconciled.  All changed / newly prescribed medications were explained.  A complete medication list was provided to the patient / caregiver.

## 2010-05-14 LAB — DIFFERENTIAL
Lymphs Abs: 1.7 10*3/uL (ref 0.7–4.0)
Monocytes Absolute: 0.8 10*3/uL (ref 0.1–1.0)
Neutro Abs: 3.3 10*3/uL (ref 1.7–7.7)
Neutrophils Relative %: 56 % (ref 43–77)

## 2010-05-14 LAB — CBC
MCHC: 34.1 g/dL (ref 30.0–36.0)
RBC: 4.54 MIL/uL (ref 3.87–5.11)

## 2010-05-14 LAB — URINE MICROSCOPIC-ADD ON

## 2010-05-14 LAB — BASIC METABOLIC PANEL
CO2: 27 mEq/L (ref 19–32)
Calcium: 8.6 mg/dL (ref 8.4–10.5)
Chloride: 109 mEq/L (ref 96–112)
GFR calc Af Amer: >60 mL/min (ref >60)
GFR calc non Af Amer: 52 mL/min — ABNORMAL LOW (ref >60)
Glucose, Bld: 128 mg/dL — ABNORMAL HIGH (ref 70–99)

## 2010-05-14 LAB — URINALYSIS, ROUTINE W REFLEX MICROSCOPIC
Bilirubin Urine: NEGATIVE
Hgb urine dipstick: NEGATIVE
Nitrite: POSITIVE — AB
Protein, ur: NEGATIVE mg/dL
Specific Gravity, Urine: 1.023 (ref 1.005–1.030)
Urobilinogen, UA: 0.2 mg/dL (ref 0.0–1.0)

## 2010-05-14 LAB — POCT CARDIAC MARKERS
Myoglobin, poc: 61.6 ng/mL (ref 12–200)
Troponin i, poc: <0.05 ng/mL (ref 0.00–0.09)

## 2010-06-03 ENCOUNTER — Other Ambulatory Visit: Payer: Self-pay | Admitting: Cardiovascular Disease

## 2010-07-05 ENCOUNTER — Other Ambulatory Visit: Payer: Self-pay | Admitting: Cardiovascular Disease

## 2010-07-09 NOTE — H&P (Signed)
NAMEKIEREN, RICCI NO.:  [id number]   MEDICAL RECORD NO.:  [id number]          PATIENT TYPE:  EMS   LOCATION:  [facility]                         FACILITY:  [facility]   PHYSICIAN:  Sandria Bales. Ezzard Standing, M.D.  DATE OF BIRTH:  [birth year]   DATE OF ADMISSION:  10/19/2004  DATE OF DISCHARGE:                                HISTORY & PHYSICAL   HISTORY OF PRESENT ILLNESS:  This is a 70 year old white female who is a  patient of Dr. Rodrigo Ran who about 7 p.m. started experiencing some  epigastric abdominal pain.  About 12 midnight she had nausea, vomiting, and  chills, vomited a second time and came to the emergency room.   Past GI history:  She was treated for ulcer disease about three to four  years ago by Dr. Lina Sar.  She had an endoscopy which proved these,  though she has had no subsequent endoscopy and she has been on Prilosec  since that time.   She is denying any known history of liver disease, gallbladder disease,  pancreatic disease, or colon disease.  She had an abdominal hysterectomy in  1981 for benign disease.   PAST MEDICAL HISTORY:  She is allergic to PENICILLIN which gave her a rash.  She is allergic to SULFA which made her break out.   CURRENT MEDICATIONS:  1.  Metoprolol one-half tablet twice a day.  2.  Prilosec over-the-counter.  3.  Doxepin for sleep.  4.  Hyzaar.  5.  Zocor for elevated cholesterol.  6.  Evista.   REVIEW OF SYSTEMS:  NEUROLOGIC:  No seizure, loss of consciousness.  PULMONARY:  She has had no evidence of pneumonia or tuberculosis.  Does not  smoke cigarettes.  CARDIAC:  She has had diagnosis of PVCs this June of  2006, has been placed on the metoprolol for this.  GASTROINTESTINAL:  See  history of present illness.  UROLOGIC:  No evidence of kidney stones or  kidney infections.  MUSCULOSKELETAL:  She fractured her left wrist several  years ago and had repair by Dr. Metro Kung.  This seems to be doing pretty  well.  She has a  plate in her left wrist.  PSYCHIATRIC:  She has what she  describes as a sleep disorder, anxiety but the doxepin seems to take care of  this.   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  Her mother who is an older woman is in the room with her and her husband is  dead since 59.   PHYSICAL EXAMINATION:  VITAL SIGNS:  Temperature 98.8, pulse 110,  respirations 20.  GENERAL:  She is a well-nourished, mildly obese white female, alert and  cooperative on physical examination.  HEENT:  Unremarkable.  NECK:  Supple.  No feel no mass, no thyromegaly.  LUNGS:  Clear to auscultation.  HEART:  Regular rate and rhythm without murmur or rub.  ABDOMEN:  She has a well healed lower midline incision.  She has no hernia,  no mass.  She is tender in her right lower quadrant without guarding, maybe  minimal  rebound.  RECTAL:  I did not do a rectal examination on her.  EXTREMITIES:  She has good strength in the upper and lower extremities.  NEUROLOGIC:  Grossly intact.   LABORATORIES:  Sodium 138, potassium 3, chloride 105, CO2 24, creatinine  1.1.  Her liver functions are normal.  Amylase 38, lipase 17.  White blood  count 18,100, hemoglobin 14.1, hematocrit 41.  Urinalysis is unremarkable.  CT scan shows a thickened appendix with surrounding inflammation consistent  with acute appendicitis.   DIAGNOSES:  1.  Appendicitis.  Discussed with the patient about going ahead with      surgery.  Discussed the indications and potential complications of      surgery.  Potential complications include, but not limited to, bleeding,      infection, bowel injury, and the possibility of open surgery.  I think      she understands this and wants to go ahead with surgery.  2.  PVCs on metoprolol.  3.  History of ulcer disease on Prilosec.  4.  Sleep disorder/anxiety.  5.  Hypercholesterolemia.  6.  Osteoporosis.      Sandria Bales. Ezzard Standing, M.D.  Electronically Signed      DHN/MEDQ  D:  10/19/2004  T:  10/19/2004  Job:  [id number]   cc:   Loraine Leriche A. Waynard Edwards, M.D.  [address]  [place]  [place] [address]  Fax: [phone]

## 2010-07-09 NOTE — Op Note (Signed)
NAME:  [name], [name]                     ACCOUNT NO.:  [id number]   MEDICAL RECORD NO.:  [id number]                   PATIENT TYPE:  AMB   LOCATION:  [facility]                                  FACILITY:  [facility]   PHYSICIAN:  Lowell Bouton, M.D.      DATE OF BIRTH:  1940-04-23   DATE OF PROCEDURE:  09/30/2002  DATE OF DISCHARGE:                                 OPERATIVE REPORT   PREOPERATIVE DIAGNOSES:  Left carpal tunnel syndrome with median nerve  compression, left distal forearm.   POSTOPERATIVE DIAGNOSES:  Left carpal tunnel syndrome with median  compression, left distal forearm.   PROCEDURE:  Decompression of median nerve, left carpal tunnel, with  decompression of median nerve, left distal forearm.   SURGEON:  Lowell Bouton, M.D.   ANESTHESIA:  Infraclavicular block augmented with 0.5% Marcaine local.   OPERATIVE FINDINGS:  The patient had marked narrowing at the carpal canal.  The motor branch of the nerve was identified and was intact.  The nerve in  the distal forearm showed narrowing more proximally and then widening  approximately an inch above the proximal wrist crease.   DESCRIPTION OF PROCEDURE:  Under block anesthesia with a tourniquet of the  left arm, the left hand was prepped and draped in the usual fashion.  After  exsanguinating the limb, the tourniquet was inflated to 250 mmHg.  Half  percent plain Marcaine was inserted in the subcutaneous tissues.  A  longitudinal incision was made in the palm just ulnar to the thenar crease  and then curved ulnarly to the distal wrist crease.  Sharp dissection was  carried to the subcutaneous tissues, and bleeding points were coagulated.  Blunt dissection was carried to the superficial palmar fascia down to the  carpal canal.  A hemostat was then placed in the carpal tunnel up against  the hook of the hamate, and the transverse carpal ligament was divided on  the ulnar side of the median nerve.  The  proximal end of the ligament was  divided with the scissors after dissecting the nerve away from the  undersurface of the ligament.  The carpal canal was then palpated and was  found to be adequately decompressed.  The nerve was examined, and the motor  branch was identified and traced to its insertion in the muscle.  A second  incision was then made in the distal forearm in line with the previous  incision from her wrist surgery.  Blunt dissection was carried through the  subcutaneous tissues, and bleeding points were coagulated.  Blunt dissection  was carried down to the median nerve which was examined over a length of 6  cm.  The nerve was fairly narrowed in the proximal end of the wound without  any evidence of compression.  It then expanded in the more distal portion of  the wound.  A retractor was inserted allowing connection of the two  incisions, and the forearm fascia was incised.  The nerve was found to be  adequately decompressed.  The wound was closed in the palm with 4-0 nylon  sutures, and a vessel loop drain was left in the proximal incision and then  closed with a 3-0 subcuticular Prolene.  Steri-Strips were applied followed  by sterile dressings and a volar wrist splint.  The patient tolerated the  procedure well and went to the recovery room awake and stable, in good  condition.                                               Lowell Bouton, M.D.    EMM/MEDQ  D:  09/30/2002  T:  09/30/2002  Job:  [id number]

## 2010-07-09 NOTE — Op Note (Signed)
NAMEANGELICE, PIECH NO.:  [id number]   MEDICAL RECORD NO.:  [id number]          PATIENT TYPE:  INP   LOCATION:  1823                         FACILITY:  [facility]   PHYSICIAN:  Sandria Bales. Ezzard Standing, M.D.  DATE OF BIRTH:  May 04, 1940   DATE OF PROCEDURE:  10/19/2004  DATE OF DISCHARGE:                                 OPERATIVE REPORT   PREOPERATIVE DIAGNOSIS:  Appendicitis.   POSTOPERATIVE DIAGNOSIS:  Retrocecal appendicitis with adhesions of omentum  and small bowel into the pelvis.   PROCEDURE:  Laparoscopic appendectomy, enterolysis of adhesions (spent 30  minutes with enterolysis).   SURGEON:  Sandria Bales. Ezzard Standing, M.D.   ASSISTANT:  None.   ANESTHESIA:  General and approximately 15 mL of 1% Xylocaine.   COMPLICATIONS:  None.   INDICATIONS FOR PROCEDURE:  Ms. Armes is a 70 year old white female who  presented with signs and symptoms of appendicitis and CT scan confirming a  retrocecal appendix.  She now comes in for attempted laparoscopic  appendectomy.  The indications and potential complications were explained to  the patient.  The potential complications include but are not limited to  bleeding, infection, bowel injury, and the possibility of open surgery.   DESCRIPTION OF PROCEDURE:  The patient was placed in a supine position.  She  had her left arm tucked, had a Foley catheter in place, was given Cipro and  Flagyl preoperatively.  Her abdomen was prepped with Betadine solution and  sterilely draped.   An infraumbilical incision was made with sharp dissection carried down to  the abdominal cavity.  I then placed a 5 mm trocar in the right upper  quadrant, 10 mm in the left lower quadrant, and the Hasson trocar was a 12  mm trocar.  I used the 0 degree 10 mm laparoscope and a 5 mm 30 degree  laparoscope.  I spent between 30 and 40 minutes doing enterolysis of  adhesions at the beginning of the procedure.  I had to take down the omentum  from the anterior  abdominal wall.  The omentum was also stuck down in the  pelvis and she had a loop of small bowel stuck in the pelvis.  I had to  mobilize the right colon, all to get to the appendix which was retrocecal up  at the pelvic brim.   After identifying the appendix, I grasped the mesentery with the Harmonic  scalpel, got to the base of the appendix.  I used a reticulating 35 mm endo-  GIA stapler across the base of the appendix and then put the appendix in an  endocatch bag and delivered it through the umbilicus.  I then reinspected  the abdomen.  I irrigated the abdomen and pelvis out with saline using  approximately 2 liters.  There was no bleeding, no residual purulent  material.  I reinspected the omentum that I had taken down, even though a  little bit bloody, there was no active bleeding.  I irrigated this off.   I then removed each trocar under direct visualization, there was no bleeding  at the trocar sites.  The umbilical  trocar was closed with a 0 Vicryl  suture.  The skin at all sites was closed with a 5-0 Monocryl suture.  Benzoin and Steri-Strips was applied and the incisions sterilely dressed.  The patient tolerated the procedure well and was transported to the recovery  room in good condition.      Sandria Bales. Ezzard Standing, M.D.  Electronically Signed     DHN/MEDQ  D:  10/19/2004  T:  10/19/2004  Job:  [id number]

## 2010-07-09 NOTE — Op Note (Signed)
[name], [name]                     ACCOUNT NO.:  [id number]   MEDICAL RECORD NO.:  [id number]                   PATIENT TYPE:  AMB   LOCATION:  DSC                                  FACILITY:  [facility]   PHYSICIAN:  Lowell Bouton, M.D.      DATE OF BIRTH:  1940/02/28   DATE OF PROCEDURE:  08/02/2002  DATE OF DISCHARGE:                                 OPERATIVE REPORT   PREOPERATIVE DIAGNOSIS:  Intra-articular fracture, left distal radius.   POSTOPERATIVE DIAGNOSIS:  Intra-articular fracture, left distal radius.   PROCEDURE:  Open reduction and internal fixation left distal radius.   SURGEON:  Lowell Bouton, M.D.   ANESTHESIA:  General.   FINDINGS:  The patient had a comminuted fracture of the distal radius. There  was a transverse component through the mid metaphysis that had mainly had  volar displacement. There was a large radial styloid fragment with  displacement of the articular surface.   DESCRIPTION OF PROCEDURE:  Under general anesthesia with the tourniquet on  the left arm, the left hand was prepped and draped in the usual fashion and  after exsanguinating the limb, the tourniquet was inflated to 250 mmHg.  Chinese fingertraps were used on the index and middle fingers to apply 10  pounds of traction over the under-the-table longitudinally to the wrist.  A  longitudinal incision was made over the SVR tendon volarly. It was then  zigzagged distally at the wrist crease. Sharp dissection was carried through  the subcutaneous tissues and bleeding points were coagulated.  Sharp  dissection was carried through the FCR tendon sheath and the tendon was  retracted radially.  The floor of the sheath was incised longitudinally and  blunt dissection was used to move the FPL ulnarly.  Weitlander retractors  were inserted for retraction and a longitudinal incision was made in the  pronator quadratus near its insertion. This was then elevated with a  Glorious Peach  off of the distal radius. The fracture site was identified and curet was  used to clean out the fracture site.  The radial styloid fragment could not  be reduced until the first dorsal compartment was released.  This was done  sharply from the volar approach. The radial styloid was then reduced and was  held temporarily with a 4.5 K-wire.  X-rays showed the articular surface to  be aligned well.  A DVR plate was then applied to the volar aspect of the  left wrist and the 3.5 mm screw in the slotted hole was inserted first. The  plate was then positioned and x-rays showed acceptable alignment.  The pegs  were then inserted distally with one threaded peg and the rest unthreaded.  Four pegs were used to hold the distal component.  The K-wire was then  removed.  Two more 3.5 mm screws were placed in the proximal portion of the  plate.  This allowed good stable fixation of the fracture and under  fluoroscopic control,  no motion was present at the fracture site.  X-rays in  the lateral position with the hand elevated off the table showed no  penetration of the articular surface with the pegs.  The wound was then  irrigated copiously with saline. The pronator quadratus was repaired with a  4-0 Vicryl. A TLC drain was inserted and the subcutaneous tissue closed with  4-0 Vicryl. The skin was closed with a 3-  0 subcuticular Prolene and Steri-Strips were applied and 0.5% Marcaine was  inserted for pain control. The patient was placed in a volar wrist splint.  She tolerated the procedure well and went to the recovery room awake,  stable, and in good condition.                                               Lowell Bouton, M.D.    EMM/MEDQ  D:  08/02/2002  T:  08/03/2002  Job:  [id number]

## 2010-07-09 NOTE — H&P (Signed)
[facility]  Patient:    [name], [name] Visit Number: [id number] MRN: [id number]          Service Type: END Location: ENDO Attending Physician:  Mervin Hack Dictated by:   Hedwig Morton. Juanda Chance, M.D. [facility] Admit Date:  06/07/2001   CC:         Rodrigo Ran, M.D.   History and Physical  INDICATIONS:  This 70 year old white female is being evaluated for difficulty in swallowing and gastroesophageal reflux disease.  She reports food stopping occasionally, about three times in the last several months, where she had to regurgitate it.  She has heartburn, but since we put her on Aciphex 20 mg a day, her symptoms have subsided.  She is also undergoing colonoscopy ______ screening.  She has a personal history of colon polyps, and last colonoscopy was in March 1999.  PROCEDURE:  Upper endoscopy.  INSTRUMENT:  Olympus single-channel videoscope.  PREMEDICATION:  Versed 5 mg IV, Demerol 75 mg IV.  DESCRIPTION OF PROCEDURE:  The Olympus single-channel videoscope was passed routinely through the posterior pharynx into the esophagus.  The patient was monitored by pulse oximetry.  Her oxygen saturations were normal.  Proximal and distal esophageal mucosa were unremarkable.  Squamocolumnar junction was normal without evidence of esophagitis or fibrosis.  Endoscope traversed into the stomach without resistance.  Stomach:  The stomach was insufflated with air and showed normal-appearing gastric folds.  There were patches of minimal erythema in the ______ pyloric antrum.  Biopsies were taken for CLOtest.  Pyloric outlet itself was normal. Retroflexion of the endoscope revealed normal fundus and cardia.  Duodenum:  The duodenal bulb and descending duodenum were normal.  Endoscope was then retracted and stomach decompressed.  The patient tolerated the procedure well.  IMPRESSION:  Essentially normal upper endoscopy of esophagus, stomach, and duodenum, with  no evidence to explain dysphagia, status post CLOtest.  PLAN:  The patients dysphagia has resolved on Aciphex 200 mg a day, indicating that the symptoms were related to esophageal inflammation rather than to a stricture.  She will continue on the Aciphex 200 mg a day as well as some antireflux measures.  PROCEDURE:  Colonoscopy.  INSTRUMENT:  Olympus single-channel videoscope.  PREMEDICATION:  Additional Demerol 10 mg IV and Versed 2 mg IV.  DESCRIPTION OF PROCEDURE:  The Olympus single-channel videoscope was passed under direct vision through the rectum to the sigmoid colon.  The patient was again monitored by pulse oximetry.  Her oxygen saturations were satisfactory. Her prep was excellent.  Anal canal showed small hemorrhoidal veins which were visualized by retroflexing the endoscope in the rectum.  There were numerous diverticula of the sigmoid colon but no significant narrowing of the colon. Mucosa appeared normal.  Haustral folds were somewhat hypertrophied. Descending colon, splenic flexure were normal.  Diverticula did not extend through the transverse or right colon, the mucosa appeared normal.  Hepatic flexure and right colon were reviewed thoroughly and showed normal cecum and ileocecal valve.  Colonoscope was then retracted after video photographs of the cecal pouch were obtained.  The patient tolerated the procedure well. There were no polyps.  IMPRESSION:  Mild diverticulosis of the left colon.  PLAN: 1. High-fiber diet. 2. Fiber supplements as needed to regulate bowel habits. 3. Suggest repeat colonoscopy in five to seven years. 4. Yearly Hemoccults.  This could be done in Dr. Therisa Doyne office. Dictated by:   Hedwig Morton. Juanda Chance, M.D. [facility] Attending Physician:  Mervin Hack DD:  06/07/01  TD:  06/07/01 Job: [id number]

## 2010-07-09 NOTE — Discharge Summary (Signed)
NAMEELLI, [name] NO.:  [id number]   MEDICAL RECORD NO.:  [id number]          PATIENT TYPE:  INP   LOCATION:  5729                         FACILITY:  [facility]   PHYSICIAN:  Sandria Bales. Ezzard Standing, M.D.  DATE OF BIRTH:  May 08, 1940   DATE OF ADMISSION:  10/19/2004  DATE OF DISCHARGE:  10/20/2004                                 DISCHARGE SUMMARY   DISCHARGE DIAGNOSES:  1.  Acute appendicitis, status post laparoscopic appendectomy, enterolysis      on October 19, 2004, by Dr. Ovidio Kin.  2.  Peptic ulcer disease.  3.  Premature ventricular contractions, medically treated.  4.  Sleep disorder/anxiety.  5.  Hypercholesterolemia.  6.  Osteoporosis.  7.  Allergy to PENICILLIN (rash).  8.  Allergy to SULFA (rash).   HOSPITAL COURSE:  [name] is a 70 year old female who began having an  epigastric abdominal pain at 7 p.m. the night prior to admission. About five  hours later she developed nausea, vomiting, and chills. She then presented  to the emergency room where CT of the abdomen revealed a thickened appendix  with surrounding inflammation consistent with an acute appendicitis.   The patient then went to the operating room on an urgent basis on October 19, 2004, under the care of Dr. Ovidio Kin. She underwent an appendectomy  laparoscopically as well as enterolysis. She was tolerated the procedure  well and was taken to ER room in stable condition.  She remained in the  hospital overnight and was eating, walking, and urinating without  difficulty. At that point she was felt to be ready for discharge to home.   She is to discharged to home in stable condition.   DISCHARGE MEDICATIONS:  Resuming her Prilosec, doxepin, Zocor, and Evista.  She is to restart her metoprolol and Hyzaar in two to three days once her  appetite is normal. Her blood pressure systolically was around 100 during  admission, so we asked her to hold these medications until she begins  eating.  She is also being sent home on Phenergan p.r.n. nausea, Vicodin as  needed for pain. She is to follow up with  Dr. Ovidio Kin in two to three  weeks. She is to call if she has a fever greater  than 101.5. She is to call  for any redness or drainage of incisions. She is to clean her incisions  gently with soap and water, no lifting over 10 pounds for three weeks, no  driving for two to three days.      Guy Franco, P.A.      Sandria Bales. Ezzard Standing, M.D.  Electronically Signed    LB/MEDQ  D:  10/20/2004  T:  10/20/2004  Job:  [id number]   cc:   Sandria Bales. Ezzard Standing, M.D.  [address], [address]  [place]  [place] [address]   Redge Gainer. Waynard Edwards, M.D.  [address]  [place]  [place] [address]  Fax: [phone]

## 2010-08-03 ENCOUNTER — Other Ambulatory Visit: Payer: Self-pay | Admitting: Cardiovascular Disease

## 2010-08-23 ENCOUNTER — Other Ambulatory Visit: Payer: Self-pay | Admitting: Internal Medicine

## 2010-08-24 ENCOUNTER — Other Ambulatory Visit: Payer: Self-pay | Admitting: Internal Medicine

## 2010-08-26 ENCOUNTER — Encounter: Payer: Self-pay | Admitting: Internal Medicine

## 2010-08-26 NOTE — Progress Notes (Signed)
Case ID: [id number] Member Number: [id number]  Case Type: Initial Review Case Start Date: 08/26/2010  Case Status: Coverage has been APPROVED. You will receive a confirmation letter confirming approval of this medication. The patient will also be notified of this approval via an automated outbound phone call or a letter. Please allow approximately 2 hours to update our system with the approval. Once updated, the prescription can be re-submitted.   Coverage  Start Date: 08/05/2010 Coverage  End Date: 08/26/2011   Patient  First Name: Kaitlyn Patient  Last Name: Mendoza DOB: Oct 14, 1940  Patient  Street Address: [address]  Patient City: [place] Patient State: [place] Patient Zip: [phone]   Drug Name  & Strength: Nexium 20 Mg

## 2010-09-05 ENCOUNTER — Other Ambulatory Visit: Payer: Self-pay | Admitting: Cardiovascular Disease

## 2010-09-20 ENCOUNTER — Telehealth: Payer: Self-pay | Admitting: Cardiovascular Disease

## 2010-09-20 MED ORDER — LISINOPRIL 5 MG PO TABS: 5.0000 mg | ORAL_TABLET | Freq: Every day | ORAL | Status: DC

## 2010-09-20 MED ORDER — METOPROLOL TARTRATE 50 MG PO TABS: 50.0000 mg | ORAL_TABLET | Freq: Two times a day (BID) | ORAL | Status: DC

## 2010-09-20 NOTE — Telephone Encounter (Signed)
Pt takes lisinopriol 5mg  qd and metoprolol 50mg  bid and she wants to change Pharm to [employer] order

## 2010-10-03 ENCOUNTER — Other Ambulatory Visit: Payer: Self-pay | Admitting: Internal Medicine

## 2010-11-11 ENCOUNTER — Other Ambulatory Visit: Payer: Self-pay | Admitting: Internal Medicine

## 2010-11-11 DIAGNOSIS — Z1231 Encounter for screening mammogram for malignant neoplasm of breast: Secondary | ICD-10-CM

## 2010-11-25 ENCOUNTER — Encounter: Payer: Self-pay | Admitting: Cardiovascular Disease

## 2010-11-26 ENCOUNTER — Ambulatory Visit (INDEPENDENT_AMBULATORY_CARE_PROVIDER_SITE_OTHER): Payer: Medicare Other | Admitting: Cardiovascular Disease

## 2010-11-26 ENCOUNTER — Encounter: Payer: Self-pay | Admitting: Cardiovascular Disease

## 2010-11-26 VITALS — BP 122/80 | HR 63 | Resp 18 | Ht 63.0 in | Wt 172.0 lb

## 2010-11-26 DIAGNOSIS — R002 Palpitations: Secondary | ICD-10-CM

## 2010-11-26 DIAGNOSIS — I429 Cardiomyopathy, unspecified: Secondary | ICD-10-CM

## 2010-11-26 NOTE — Assessment & Plan Note (Signed)
Resolved. She is known to have PVCs but much better on the beta blocker.

## 2010-11-26 NOTE — Assessment & Plan Note (Signed)
EF of 45-50%, She is on a beta blocker and Ace-inhibitor. Will repeat echo.

## 2010-11-26 NOTE — Progress Notes (Signed)
History of Present Illness:70 yo WF with history of hyperlipidemia, HTN, low normal LV function by echo October 2011 and  multiple GI issues including diverticulosis, PUD and GERD along with IBS and fibromyalgia who is here today for cardiac follow up. She was seen as a new patient for further evaluation of palpitations on 05/18/09 and was last seen in our office in October 2011.  She has had known PVCs for many years. Echo in October 2011 with mild global hypokinesis with EF of 45-50%. Her 48 hour Holter monitor showed frequent PVCs with some trigeminy and bigeminy. Her beta blocker was increased and she cut out caffeine and felt much better at that time.  She has been on Lisinopril.   She is here today for one year cardiac follow up. She has only had several episodes of palpitations over the last year. No chest pain, SOB, near syncope or syncope.   Past Medical History  Diagnosis Date  . Arrhythmia     PVC  . Hypercholesteremia   . Osteoporosis   . Anxiety   . Sleep disorder   . PUD (peptic ulcer disease)   . Appendicitis   . Colon polyp   . Diverticulitis   . GERD (gastroesophageal reflux disease)   . IBS (irritable bowel syndrome)   . Fibromyalgia   . Hypertension     Past Surgical History  Procedure Date  . Appendectomy   . Wrist surgery   . Carpal tunnel release     Current Outpatient Prescriptions  Medication Sig Dispense Refill  . doxepin (SINEQUAN) 10 MG capsule Take 10 mg by mouth 2 (two) times daily.        . ergocalciferol (VITAMIN D2) 50000 UNITS capsule Take 50,000 Units by mouth once a week.        Marland Kitchen lisinopril (PRINIVIL,ZESTRIL) 5 MG tablet Take 1 tablet (5 mg total) by mouth daily.  90 tablet  01  . metoprolol (LOPRESSOR) 50 MG tablet Take 1 tablet (50 mg total) by mouth 2 (two) times daily.  180 tablet  1  . NEXIUM 20 MG capsule TAKE ONE CAPSULE BY MOUTH EVERY DAY  30 capsule  6  . Nutritional Supplements (ENSURE CLEAR) LIQD Take by mouth once a week.        .  Probiotic Product (ALIGN PO) Take by mouth.        . raloxifene (EVISTA) 60 MG tablet Take 60 mg by mouth daily.        . simvastatin (ZOCOR) 40 MG tablet Take 40 mg by mouth at bedtime.          Allergies  Allergen Reactions  . Penicillins   . Sulfonamide Derivatives     History   Social History  . Marital Status: Widowed    Spouse Name: N/A    Number of Children: N/A  . Years of Education: N/A   Occupational History  . Not on file.   Social History Main Topics  . Smoking status: Never Smoker   . Smokeless tobacco: Not on file  . Alcohol Use: No  . Drug Use: No  . Sexually Active:    Other Topics Concern  . Not on file   Social History Narrative  . No narrative on file    Family History  Problem Relation Age of Onset  . Heart attack Maternal Grandfather 72    Review of Systems:  As stated in the HPI and otherwise negative.   BP 122/80  Pulse 63  Resp 18  Ht 5\' 3"  (1.6 m)  Wt 172 lb (78.019 kg)  BMI 30.47 kg/m2  Physical Examination: General: Well developed, well nourished, NAD HEENT: OP clear, mucus membranes moist SKIN: warm, dry. No rashes. Neuro: No focal deficits Musculoskeletal: Muscle strength 5/5 all ext Psychiatric: Mood and affect normal Neck: No JVD, no carotid bruits, no thyromegaly, no lymphadenopathy. Lungs:Clear bilaterally, no wheezes, rhonci, crackles Cardiovascular: Regular rate and rhythm. No murmurs, gallops or rubs. Abdomen:Soft. Bowel sounds present. Non-tender.  Extremities: No lower extremity edema. Pulses are 2 + in the bilateral DP/PT.  JYN:WGNFA rhythm, rate 63 bpm. 1st degree AV block.

## 2010-11-26 NOTE — Patient Instructions (Signed)

## 2010-11-29 ENCOUNTER — Encounter: Payer: Self-pay | Admitting: Cardiovascular Disease

## 2010-12-03 ENCOUNTER — Ambulatory Visit (HOSPITAL_COMMUNITY): Payer: Medicare Other | Attending: Cardiovascular Disease | Admitting: Radiology

## 2010-12-03 DIAGNOSIS — R011 Cardiac murmur, unspecified: Secondary | ICD-10-CM

## 2010-12-03 DIAGNOSIS — I429 Cardiomyopathy, unspecified: Secondary | ICD-10-CM

## 2010-12-03 DIAGNOSIS — R002 Palpitations: Secondary | ICD-10-CM | POA: Insufficient documentation

## 2010-12-07 ENCOUNTER — Encounter: Payer: Self-pay | Admitting: *Deleted

## 2010-12-22 ENCOUNTER — Telehealth: Payer: Self-pay | Admitting: Cardiovascular Disease

## 2010-12-22 NOTE — Telephone Encounter (Signed)
Lm w/echo results per Dr. Clifton James note.

## 2010-12-22 NOTE — Telephone Encounter (Signed)
New message  Pt just got back from out of town  She wants to know her test results  Please call

## 2010-12-23 ENCOUNTER — Ambulatory Visit
Admission: RE | Admit: 2010-12-23 | Discharge: 2010-12-23 | Disposition: A | Discharge status: Home / Self Care | Payer: Medicare Other | Source: Ambulatory Visit | Attending: Internal Medicine | Admitting: Internal Medicine

## 2010-12-23 DIAGNOSIS — Z1231 Encounter for screening mammogram for malignant neoplasm of breast: Secondary | ICD-10-CM

## 2011-01-25 ENCOUNTER — Encounter (INDEPENDENT_AMBULATORY_CARE_PROVIDER_SITE_OTHER): Payer: Self-pay | Admitting: General Surgery

## 2011-01-25 ENCOUNTER — Ambulatory Visit (INDEPENDENT_AMBULATORY_CARE_PROVIDER_SITE_OTHER): Payer: Medicare Other | Admitting: General Surgery

## 2011-01-25 VITALS — BP 126/84 | HR 68 | Temp 97.2°F | Resp 18 | Ht 63.5 in | Wt 175.2 lb

## 2011-01-25 DIAGNOSIS — L02219 Cutaneous abscess of trunk, unspecified: Secondary | ICD-10-CM

## 2011-01-25 DIAGNOSIS — L03319 Cellulitis of trunk, unspecified: Secondary | ICD-10-CM

## 2011-01-25 DIAGNOSIS — L02212 Cutaneous abscess of back [any part, except buttock]: Secondary | ICD-10-CM

## 2011-01-25 NOTE — Progress Notes (Signed)
Patient ID: [name], female   DOB: Feb 12, 1941, 70 y.o.   MRN: [id number]  Chief Complaint  Patient presents with  . Follow-up    abscess with discharge    HPI [name] is a 70 y.o. female.  She is referred to me by Dr. Rodrigo Ran for management of an abscess of the back.  The patient states she has been having pain and drainage from a tender reddened area on the  upper mid back for about 2 weeks. It spontaneously drained she has been managing it herself and putting ointment on it. She has recently started on doxycycline.  She may have had a similar milder problem years ago.  She is basically healthy. She has fibromyalgia, hypertension, and irritable bowel syndrome, PVCs, and a sleep disorder.  She is a widow and lives alone. HPI  Past Medical History  Diagnosis Date  . Arrhythmia     PVC  . Hypercholesteremia   . Osteoporosis   . Anxiety   . Sleep disorder   . PUD (peptic ulcer disease)   . Appendicitis   . Colon polyp   . Diverticulitis   . GERD (gastroesophageal reflux disease)   . IBS (irritable bowel syndrome)   . Fibromyalgia   . Hypertension   . Thyroid disease   . Palpitation   . Back abscess     Past Surgical History  Procedure Date  . Appendectomy   . Wrist surgery   . Carpal tunnel release     Family History  Problem Relation Age of Onset  . Heart attack Maternal Grandfather 72  . Glaucoma Mother   . Macular degeneration Mother   . Cancer Father     lung  . Cancer Brother     lung  . Cancer Paternal Grandfather     colon  . Tuberculosis Paternal Grandfather     Social History History  Substance Use Topics  . Smoking status: Never Smoker   . Smokeless tobacco: Never Used  . Alcohol Use: No    Allergies  Allergen Reactions  . Penicillins Swelling    By second day there was swelling of tongue and could not breathe  . Sulfonamide Derivatives Hives and Rash    Where applied.    Current Outpatient Prescriptions    Medication Sig Dispense Refill  . doxycycline (VIBRAMYCIN) 100 MG capsule Take 100 mg by mouth 2 (two) times daily.        . mupirocin ointment (BACTROBAN) 2 % Apply 1 application topically 3 (three) times daily.        Marland Kitchen doxepin (SINEQUAN) 10 MG capsule Take 10 mg by mouth 2 (two) times daily.        . ergocalciferol (VITAMIN D2) 50000 UNITS capsule Take 50,000 Units by mouth once a week.        Marland Kitchen lisinopril (PRINIVIL,ZESTRIL) 5 MG tablet Take 1 tablet (5 mg total) by mouth daily.  90 tablet  01  . metoprolol (LOPRESSOR) 50 MG tablet Take 1 tablet (50 mg total) by mouth 2 (two) times daily.  180 tablet  1  . NEXIUM 20 MG capsule TAKE ONE CAPSULE BY MOUTH EVERY DAY  30 capsule  6  . Nutritional Supplements (ENSURE CLEAR) LIQD Take by mouth once a week.        . Probiotic Product (ALIGN PO) Take by mouth.        . raloxifene (EVISTA) 60 MG tablet Take 60 mg by mouth daily.        Marland Kitchen  simvastatin (ZOCOR) 40 MG tablet Take 40 mg by mouth at bedtime.          Review of Systems Review of Systems  Constitutional: Negative for fever, chills and unexpected weight change.  HENT: Negative for hearing loss, congestion, sore throat, trouble swallowing and voice change.   Eyes: Negative for visual disturbance.  Respiratory: Negative for cough and wheezing.   Cardiovascular: Negative for chest pain, palpitations and leg swelling.  Gastrointestinal: Negative for nausea, vomiting, abdominal pain, diarrhea, constipation, blood in stool, abdominal distention and anal bleeding.  Genitourinary: Negative for hematuria, vaginal bleeding and difficulty urinating.  Musculoskeletal: Negative for arthralgias.  Skin: Negative for rash and wound.  Neurological: Negative for seizures, syncope and headaches.  Hematological: Negative for adenopathy. Does not bruise/bleed easily.  Psychiatric/Behavioral: Negative for confusion.    Blood pressure 126/84, pulse 68, temperature 97.2 F (36.2 C), temperature source  Temporal, resp. rate 18, height 5' 3.5" (1.613 m), weight 175 lb 3.2 oz (79.47 kg).  Physical Exam Physical Exam  Constitutional: She appears well-developed and well-nourished. No distress.  Eyes: Conjunctivae are normal. Pupils are equal, round, and reactive to light.  Cardiovascular: Normal rate, regular rhythm and normal heart sounds.   Pulmonary/Chest: Effort normal and breath sounds normal. No respiratory distress.  Neurological: She is alert.  Skin: Skin is warm and dry. No rash noted. She is not diaphoretic. There is erythema. No pallor.     Psychiatric: She has a normal mood and affect. Her behavior is normal. Judgment and thought content normal.    Data Reviewed No data to review Assessment    1.5 cm abscess of mid back involving skin subcutaneous tissue.    Plan    I unroofed and debrided the abscess and packed it with iodoform.  Wound care instructions given to patient. Anticipate complete healing in 2 weeks. Return if this does not happen.  Complete course of antibiotics.       Dajon Lazar M 01/25/2011, 3:46 PM

## 2011-01-25 NOTE — Patient Instructions (Signed)
You had an abscess of your back. I was able to open this up and pack it all the way to the bottom. This involves the skin and the fatty tissue only.  Continue the antibiotics to Dr. Waynard Edwards gave you until they are all gone.  On Thursday, December 6, remove the packing and do not repack. Begin taking showers twice a day. Cover the wound with dry gauze. Do not use any ointment or cream.  This wound should heal in completely in about 2 weeks. If it has not healed by then please return and I will reexamine the wound. If it completely healed the nothing further needs to be done.

## 2011-03-08 ENCOUNTER — Other Ambulatory Visit: Payer: Self-pay | Admitting: Cardiovascular Disease

## 2011-03-12 ENCOUNTER — Other Ambulatory Visit: Payer: Self-pay | Admitting: Cardiovascular Disease

## 2011-05-02 ENCOUNTER — Other Ambulatory Visit: Payer: Self-pay | Admitting: Internal Medicine

## 2011-05-25 ENCOUNTER — Other Ambulatory Visit: Payer: Self-pay | Admitting: Cardiovascular Disease

## 2011-06-27 ENCOUNTER — Encounter: Payer: Self-pay | Admitting: Physician Assistant

## 2011-06-27 ENCOUNTER — Ambulatory Visit (INDEPENDENT_AMBULATORY_CARE_PROVIDER_SITE_OTHER): Payer: Medicare Other | Admitting: Physician Assistant

## 2011-06-27 VITALS — BP 132/74 | HR 65 | Ht 63.25 in | Wt 179.0 lb

## 2011-06-27 DIAGNOSIS — R002 Palpitations: Secondary | ICD-10-CM

## 2011-06-27 DIAGNOSIS — I429 Cardiomyopathy, unspecified: Secondary | ICD-10-CM

## 2011-06-27 NOTE — Patient Instructions (Signed)
Call if increase in palpitations.

## 2011-06-27 NOTE — Assessment & Plan Note (Signed)
Patient is stable on lower dose Lopressor which was decreased because of dizziness at the emergency room in [facility]. Continue Lopressor 25 mg b.i.d. Call if there is an increase in palpitations and we will increase the Lopressor.

## 2011-06-27 NOTE — Assessment & Plan Note (Signed)
Patient's most recent 2-D echo on 12/03/10 showed normal LV function without wall motion abnormality EF 55-65%

## 2011-06-27 NOTE — Progress Notes (Signed)
HPI:  This is a 71 year old female patient of Dr. Clifton James with history of hyperlipidemia, hypertension, and low LV function by echo in October 2011. Echo at that time showed mild global hypokinesis ejection fraction 45-50%. She last saw him on 11/26/10 and follow-up 2-D echo showed normal LV function EF 55-65% with mild AI and mild mitral valve prolapse.  The patient was cleaning her mother's house sat in [place] and developed 6 days of dizziness with moving around and ringing in her ears. She started keeping track of her blood pressure and it would fluctuate between 105/60 and 143/93. She finally went to the emergency room and a diagnosed her with vertigo and place her meclizine. They did a CT of her brain which was reportedly normal. They also gave her IV fluids. They decreased her metoprolol to 25 mg b.i.d. And told her she could take an extra 25 for palpitations. They said this could be causing the dizziness.  Patient's here today for follow-up of recent ER visit in [place]. The meclizine has cured her vertigo. She is just concerned that she needs a higher dose of metoprolol. She denies any increase in palpitations, and blood pressure has been stable.  Allergies  Allergen Reactions  . Penicillins Swelling    By second day there was swelling of tongue and could not breathe  . Sulfonamide Derivatives Hives and Rash    Where applied.    Current Outpatient Prescriptions on File Prior to Visit  Medication Sig Dispense Refill  . doxepin (SINEQUAN) 10 MG capsule Take 10 mg by mouth 2 (two) times daily.        Marland Kitchen doxycycline (VIBRAMYCIN) 100 MG capsule Take 100 mg by mouth 2 (two) times daily as needed.       . ergocalciferol (VITAMIN D2) 50000 UNITS capsule Take 50,000 Units by mouth once a week.        Marland Kitchen lisinopril (PRINIVIL,ZESTRIL) 5 MG tablet TAKE 1 TABLET DAILY  90 tablet  6  . metoprolol (LOPRESSOR) 50 MG tablet TAKE 1 TABLET TWICE A DAY  180 tablet  3  . mupirocin  ointment (BACTROBAN) 2 % Apply 1 application topically 3 (three) times daily.        Marland Kitchen NEXIUM 20 MG capsule TAKE ONE CAPSULE BY MOUTH EVERY DAY  30 capsule  1  . Nutritional Supplements (ENSURE CLEAR) LIQD Take by mouth once a week.        . Probiotic Product (ALIGN PO) Take by mouth.        . raloxifene (EVISTA) 60 MG tablet Take 60 mg by mouth daily.        . simvastatin (ZOCOR) 40 MG tablet Take 40 mg by mouth at bedtime.          Past Medical History  Diagnosis Date  . Arrhythmia     PVC  . Hypercholesteremia   . Osteoporosis   . Anxiety   . Sleep disorder   . PUD (peptic ulcer disease)   . Appendicitis   . Colon polyp   . Diverticulitis   . GERD (gastroesophageal reflux disease)   . IBS (irritable bowel syndrome)   . Fibromyalgia   . Hypertension   . Thyroid disease   . Palpitation   . Back abscess     Past Surgical History  Procedure Date  . Appendectomy   . Wrist surgery   . Carpal tunnel release     Family History  Problem Relation Age of Onset  .  Heart attack Maternal Grandfather 72  . Glaucoma Mother   . Macular degeneration Mother   . Cancer Father     lung  . Cancer Brother     lung  . Cancer Paternal Grandfather     colon  . Tuberculosis Paternal Grandfather     History   Social History  . Marital Status: Widowed    Spouse Name: N/A    Number of Children: N/A  . Years of Education: N/A   Occupational History  . Not on file.   Social History Main Topics  . Smoking status: Never Smoker   . Smokeless tobacco: Never Used  . Alcohol Use: No  . Drug Use: No  . Sexually Active: Not on file   Other Topics Concern  . Not on file   Social History Narrative  . No narrative on file    ROS:see history of present illness otherwise negative   PHYSICAL EXAM: Well-nournished, in no acute distress. Neck: No JVD, HJR, Bruit, or thyroid enlargement Lungs: No tachypnea, clear without wheezing, rales, or rhonchi Cardiovascular: RRR, PMI not  displaced, heart sounds normal, 1/6 systolic murmur at the left border,no gallops, bruit, thrill, or heave. Abdomen: BS normal. Soft without organomegaly, masses, lesions or tenderness. Extremities: without cyanosis, clubbing or edema. Good distal pulses bilateral SKin: Warm, no lesions or rashes  Musculoskeletal: No deformities Neuro: no focal signs  BP 132/74  Pulse 65  Ht 5' 3.25" (1.607 m)  Wt 179 lb (81.194 kg)  BMI 31.46 kg/m2  2Decho 12/03/10: Study Conclusions  - Left ventricle: The cavity size was normal. Wall thickness   was increased in a pattern of mild LVH. Systolic function   was normal. The estimated ejection fraction was in the   range of 55% to 65%. Wall motion was normal; there were no   regional wall motion abnormalities. Doppler parameters are   consistent with abnormal left ventricular relaxation   (grade 1 diastolic dysfunction). - Aortic valve: Mild regurgitation. - Mitral valve: Prolapse. Mild regurgitation. - Atrial septum: No defect or patent foramen ovale was   identified. - Pulmonary arteries: PA peak pressure: 31mm Hg (S).   ZOX:WRUEAV sinus rhythm with first degree AV block

## 2011-07-04 ENCOUNTER — Other Ambulatory Visit: Payer: Self-pay | Admitting: Internal Medicine

## 2011-07-04 MED ORDER — ESOMEPRAZOLE MAGNESIUM 20 MG PO CPDR: 20.0000 mg | DELAYED_RELEASE_CAPSULE | Freq: Every day | ORAL | Status: DC

## 2011-07-04 NOTE — Telephone Encounter (Signed)
rx sent

## 2011-07-08 ENCOUNTER — Encounter: Payer: Self-pay | Admitting: *Deleted

## 2011-07-21 ENCOUNTER — Encounter: Payer: Self-pay | Admitting: Internal Medicine

## 2011-07-26 ENCOUNTER — Ambulatory Visit (INDEPENDENT_AMBULATORY_CARE_PROVIDER_SITE_OTHER): Payer: Medicare Other | Admitting: Internal Medicine

## 2011-07-26 ENCOUNTER — Encounter: Payer: Self-pay | Admitting: Internal Medicine

## 2011-07-26 VITALS — BP 132/60 | HR 62 | Ht 63.0 in | Wt 179.0 lb

## 2011-07-26 DIAGNOSIS — R1032 Left lower quadrant pain: Secondary | ICD-10-CM

## 2011-07-26 DIAGNOSIS — K219 Gastro-esophageal reflux disease without esophagitis: Secondary | ICD-10-CM

## 2011-07-26 MED ORDER — DICYCLOMINE HCL 10 MG PO CAPS: ORAL_CAPSULE | ORAL | Status: DC

## 2011-07-26 MED ORDER — ESOMEPRAZOLE MAGNESIUM 20 MG PO CPDR: 20.0000 mg | DELAYED_RELEASE_CAPSULE | Freq: Every day | ORAL | Status: DC

## 2011-07-26 NOTE — Progress Notes (Signed)
[name] [birth year] MRN [id number]  History of Present Illness:  This is a 71 year old white female who is here to refill her prescription for Nexium and dicyclomine. Her last office visit was in 2009. She had acute gastroenteritis while visiting her brother in [employer] [employer], [place] in October 2012. She was hospitalized with dehydration. She has fully recovered from the acute illness. There is a history of H. pylori gastritis and esophageal stricture which was dilated in June 2009. She is well controlled on Nexium 20 mg daily. She has a positive family history of colon cancer in her grandparent and a personal history of tubular adenomas on colonoscopies in 1999, 2003 and 2009. She will be due for a repeat colonoscopy in June 2014. A CT scan of the abdomen in March 2009 for left lower quadrant abdominal pain was consistent with extensive diverticulosis.   Past Medical History  Diagnosis Date  . Arrhythmia     PVC  . Hypercholesteremia   . Osteoporosis   . Anxiety   . Sleep disorder   . PUD (peptic ulcer disease)   . Appendicitis   . Hx of adenomatous colonic polyps   . Diverticulitis   . GERD (gastroesophageal reflux disease)   . IBS (irritable bowel syndrome)   . Fibromyalgia   . Hypertension   . Thyroid disease   . Palpitation   . Back abscess   . PVC (premature ventricular contraction)    Past Surgical History  Procedure Date  . Appendectomy   . Wrist surgery     left  . Carpal tunnel release     left  . Abdominal hysterectomy     reports that she has never smoked. She has never used smokeless tobacco. She reports that she does not drink alcohol or use illicit drugs. family history includes Colon cancer in her paternal grandfather; Glaucoma in her mother; Heart attack (age of onset:72) in her maternal grandfather; Lung cancer in her brother and father; Macular degeneration in her mother; and Tuberculosis in her paternal grandfather. Allergies  Allergen Reactions  .  Penicillins Swelling    By second day there was swelling of tongue and could not breathe  . Sulfonamide Derivatives Hives and Rash    Where applied.        Review of Systems: Occasional heartburn and dysphagia. Denies rectal bleeding or abdominal pain  The remainder of the 10 point ROS is negative except as outlined in H&P   Physical Exam: General appearance  Well developed, in no distress. Eyes- non icteric. HEENT nontraumatic, normocephalic. Mouth no lesions, tongue papillated, no cheilosis. Neck supple without adenopathy, thyroid not enlarged, no carotid bruits, no JVD. Lungs Clear to auscultation bilaterally. Cor normal S1, normal S2, regular rhythm, no murmur,  quiet precordium. Abdomen: Soft with minimal tenderness left lower quadrant. Normal active bowel sounds. No distention. Rectal: Not done. Extremities no pedal edema. Skin no lesions. Neurological alert and oriented x 3. Psychological normal mood and affect.  Assessment and Plan:  Problem #1 Gastroesophageal reflux disease controlled on Nexium 20 mg daily. Patient has a history of an esophageal stricture. She has a history of H. Pylori. I suspect a recurrence of her esophageal stricture. We will schedule her for an upper endoscopy at this time.  Problem #2 History of colon polyps. He is due for a repeat colonoscopy in June 2014. We will schedule the procedure the same day as her upper endoscopy. We will refill her Bentyl 10 mg to take when necessary for abdominal  pain and IBS symptoms.    07/26/2011 Lina Sar

## 2011-12-13 ENCOUNTER — Encounter: Payer: Self-pay | Admitting: Cardiovascular Disease

## 2011-12-13 ENCOUNTER — Ambulatory Visit (INDEPENDENT_AMBULATORY_CARE_PROVIDER_SITE_OTHER): Payer: Medicare Other | Admitting: Cardiovascular Disease

## 2011-12-13 VITALS — BP 134/73 | HR 61 | Ht 64.0 in | Wt 177.0 lb

## 2011-12-13 DIAGNOSIS — I4949 Other premature depolarization: Secondary | ICD-10-CM

## 2011-12-13 DIAGNOSIS — I493 Ventricular premature depolarization: Secondary | ICD-10-CM

## 2011-12-13 MED ORDER — METOPROLOL TARTRATE 25 MG PO TABS: 25.0000 mg | ORAL_TABLET | Freq: Two times a day (BID) | ORAL | Status: DC

## 2011-12-13 NOTE — Progress Notes (Signed)
History of Present Illness: 71 yo WF with history of hyperlipidemia, HTN, low normal LV function by echo October 2011 and multiple GI issues including diverticulosis, PUD and GERD along with IBS and fibromyalgia who is here today for cardiac follow up. She was seen as a new patient for further evaluation of palpitations on 05/18/09. She has had known PVCs for many years. Echo in October 2011 with mild global hypokinesis with EF of 45-50%. Her 48 hour Holter monitor showed frequent PVCs with some trigeminy and bigeminy. Her beta blocker was increased and she cut out caffeine and felt m She has been on Lisinopril. In May 2013, the patient was cleaning her mother's house in [place] and developed 6 days of dizziness with moving around and ringing in her ears. She started keeping track of her blood pressure and it would fluctuate between 105/60 and 143/93. She finally went to the emergency room and was  diagnosed with vertigo and she was placed on meclizine. They did a CT of her brain which was reportedly normal. They also gave her IV fluids. They decreased her metoprolol to 25 mg b.i.d. And told her she could take an extra 25 mg of for palpitations. Repeat echo 10/12 with LVEF 55%, mild LVH, mild MR, mild AI.   She is here today for cardiac follow up. No chest pain, SOB, near syncope or syncope. She broke her left 3rd toe on 11/09/11 and was seen in the ED in [facility]. Overall feeling well. She has been cleaning out her mothers house. Her mother passed away last year.   Primary Care Physician: Rodrigo Ran   Past Medical History  Diagnosis Date  . Arrhythmia     PVC  . Hypercholesteremia   . Osteoporosis   . Anxiety   . Sleep disorder   . PUD (peptic ulcer disease)   . Appendicitis   . Hx of adenomatous colonic polyps   . Diverticulitis   . GERD (gastroesophageal reflux disease)   . IBS (irritable bowel syndrome)   . Fibromyalgia   . Hypertension   . Thyroid disease   . Palpitation     . Back abscess   . PVC (premature ventricular contraction)     Past Surgical History  Procedure Date  . Appendectomy   . Wrist surgery     left  . Carpal tunnel release     left  . Abdominal hysterectomy     Current Outpatient Prescriptions  Medication Sig Dispense Refill  . Cimetidine (ACID REDUCER PO) Take by mouth as needed.      . dicyclomine (BENTYL) 10 MG capsule Take 1 tablet by mouth every 4-6 hours as needed for colon spasm  60 capsule  3  . doxepin (SINEQUAN) 10 MG capsule Take 10 mg by mouth 2 (two) times daily.        Marland Kitchen doxycycline (VIBRAMYCIN) 100 MG capsule Take 100 mg by mouth 2 (two) times daily as needed.       . ergocalciferol (VITAMIN D2) 50000 UNITS capsule Take 50,000 Units by mouth once a week.        . esomeprazole (NEXIUM) 20 MG capsule Take 1 capsule (20 mg total) by mouth daily.  30 capsule  3  . lisinopril (PRINIVIL,ZESTRIL) 5 MG tablet TAKE 1 TABLET DAILY  90 tablet  6  . meclizine (ANTIVERT) 25 MG tablet Take 25 mg by mouth 3 (three) times daily as needed.      . metoprolol (LOPRESSOR) 50 MG  tablet Take 25 mg by mouth 2 (two) times daily.       . raloxifene (EVISTA) 60 MG tablet Take 60 mg by mouth daily.        . simvastatin (ZOCOR) 40 MG tablet Take 40 mg by mouth at bedtime.        . mupirocin ointment (BACTROBAN) 2 % Apply 1 application topically 3 (three) times daily.        . Probiotic Product (ALIGN PO) Take by mouth. DOES NOT TAKE EVERY DAY        Allergies  Allergen Reactions  . Penicillins Swelling    By second day there was swelling of tongue and could not breathe  . Sulfonamide Derivatives Hives and Rash    Where applied.    History   Social History  . Marital Status: Widowed    Spouse Name: N/A    Number of Children: N/A  . Years of Education: N/A   Occupational History  . Not on file.   Social History Main Topics  . Smoking status: Never Smoker   . Smokeless tobacco: Never Used  . Alcohol Use: No  . Drug Use: No  .  Sexually Active: Not on file   Other Topics Concern  . Not on file   Social History Narrative  . No narrative on file    Family History  Problem Relation Age of Onset  . Heart attack Maternal Grandfather 72  . Glaucoma Mother   . Macular degeneration Mother   . Lung cancer Father   . Lung cancer Brother   . Colon cancer Paternal Grandfather   . Tuberculosis Paternal Grandfather     Review of Systems:  As stated in the HPI and otherwise negative.   BP 134/73  Pulse 61  Ht 5\' 4"  (1.626 m)  Wt 177 lb (80.287 kg)  BMI 30.38 kg/m2  Physical Examination: General: Well developed, well nourished, NAD HEENT: OP clear, mucus membranes moist SKIN: warm, dry. No rashes. Neuro: No focal deficits Musculoskeletal: Muscle strength 5/5 all ext Psychiatric: Mood and affect normal Neck: No JVD, no carotid bruits, no thyromegaly, no lymphadenopathy. Lungs:Clear bilaterally, no wheezes, rhonci, crackles Cardiovascular: Regular rate and rhythm. No murmurs, gallops or rubs. Abdomen:Soft. Bowel sounds present. Non-tender.  Extremities: No lower extremity edema. Pulses are 2 + in the bilateral DP/PT.  Assessment and Plan:   1. PVCs: Asymptomatic. Will continue Metoprolol 25 mg po BID. No changes today. LV function normal. Dizziness resolved.

## 2011-12-13 NOTE — Patient Instructions (Addendum)
Your physician wants you to follow-up in:  12 months.  You will receive a reminder letter in the mail two months in advance. If you don't receive a letter, please call our office to schedule the follow-up appointment.   

## 2011-12-15 ENCOUNTER — Other Ambulatory Visit: Payer: Self-pay | Admitting: Internal Medicine

## 2011-12-15 DIAGNOSIS — Z1231 Encounter for screening mammogram for malignant neoplasm of breast: Secondary | ICD-10-CM

## 2012-01-04 ENCOUNTER — Ambulatory Visit
Admission: RE | Admit: 2012-01-04 | Discharge: 2012-01-04 | Disposition: A | Discharge status: Home / Self Care | Payer: Medicare Other | Source: Ambulatory Visit | Attending: Internal Medicine | Admitting: Internal Medicine

## 2012-01-04 DIAGNOSIS — Z1231 Encounter for screening mammogram for malignant neoplasm of breast: Secondary | ICD-10-CM

## 2012-04-07 ENCOUNTER — Other Ambulatory Visit: Payer: Self-pay

## 2012-06-25 ENCOUNTER — Encounter (INDEPENDENT_AMBULATORY_CARE_PROVIDER_SITE_OTHER): Payer: Self-pay | Admitting: General Surgery

## 2012-06-25 ENCOUNTER — Ambulatory Visit (INDEPENDENT_AMBULATORY_CARE_PROVIDER_SITE_OTHER): Payer: Medicare Other | Admitting: General Surgery

## 2012-06-25 VITALS — BP 120/76 | HR 68 | Temp 97.7°F | Resp 16 | Ht 63.75 in | Wt 180.6 lb

## 2012-06-25 DIAGNOSIS — L723 Sebaceous cyst: Secondary | ICD-10-CM

## 2012-06-25 DIAGNOSIS — L0390 Cellulitis, unspecified: Secondary | ICD-10-CM

## 2012-06-25 DIAGNOSIS — L089 Local infection of the skin and subcutaneous tissue, unspecified: Secondary | ICD-10-CM

## 2012-06-25 DIAGNOSIS — L0291 Cutaneous abscess, unspecified: Secondary | ICD-10-CM

## 2012-06-25 MED ORDER — DOXYCYCLINE HYCLATE 100 MG PO TABS: 100.0000 mg | ORAL_TABLET | Freq: Two times a day (BID) | ORAL | Status: DC

## 2012-06-25 NOTE — Progress Notes (Signed)
[name] is a 72 y.o. female who is here for a visit regarding a lesion in her mid back.  She is s/p I&D in 2012 of the same area.  She hasn't had any trouble with the area until about 2 weeks ago when it began to swell.  Over the last week it has become erythematous and painful to touch.    Objective: Filed Vitals:   06/25/12 1510  BP: 120/76  Pulse: 68  Temp: 97.7 F (36.5 C)  Resp: 16    General appearance: alert, cooperative and no distress abscess noted in mid back, nondraining  Area infused with subcutaneous lidocaine.  Incised with a scalpel.  Purulent drainage expressed.  I made a cruciate incision and trimmed the corners to allow the area to drain completely.   Assessment and Plan: S/P I&D or infected sebaceous cyst.  1 week Doxycycline called into pharmacy.  F/u in 4 weeks for discussion of surgical excision.      Vanita Panda, MD [facility], [place] [phone]

## 2012-06-25 NOTE — Patient Instructions (Signed)
Cover area with a dry dressing daily.  Ok to shower.  Return to the office in 4-6 weeks to discuss surgical removal of sebaceous cyst.

## 2012-07-10 ENCOUNTER — Telehealth: Payer: Self-pay | Admitting: Internal Medicine

## 2012-07-10 ENCOUNTER — Encounter (HOSPITAL_COMMUNITY): Payer: Self-pay | Admitting: Emergency Medicine

## 2012-07-10 DIAGNOSIS — E079 Disorder of thyroid, unspecified: Secondary | ICD-10-CM | POA: Insufficient documentation

## 2012-07-10 DIAGNOSIS — Z880 Allergy status to penicillin: Secondary | ICD-10-CM | POA: Insufficient documentation

## 2012-07-10 DIAGNOSIS — I499 Cardiac arrhythmia, unspecified: Secondary | ICD-10-CM | POA: Insufficient documentation

## 2012-07-10 DIAGNOSIS — R002 Palpitations: Secondary | ICD-10-CM | POA: Insufficient documentation

## 2012-07-10 DIAGNOSIS — Z860100 Personal history of colon polyps, unspecified: Secondary | ICD-10-CM | POA: Insufficient documentation

## 2012-07-10 DIAGNOSIS — F411 Generalized anxiety disorder: Secondary | ICD-10-CM | POA: Insufficient documentation

## 2012-07-10 DIAGNOSIS — IMO0001 Reserved for inherently not codable concepts without codable children: Secondary | ICD-10-CM | POA: Insufficient documentation

## 2012-07-10 DIAGNOSIS — K219 Gastro-esophageal reflux disease without esophagitis: Secondary | ICD-10-CM | POA: Insufficient documentation

## 2012-07-10 DIAGNOSIS — K589 Irritable bowel syndrome without diarrhea: Secondary | ICD-10-CM | POA: Insufficient documentation

## 2012-07-10 DIAGNOSIS — Z882 Allergy status to sulfonamides status: Secondary | ICD-10-CM | POA: Insufficient documentation

## 2012-07-10 DIAGNOSIS — Z8719 Personal history of other diseases of the digestive system: Secondary | ICD-10-CM | POA: Insufficient documentation

## 2012-07-10 DIAGNOSIS — G479 Sleep disorder, unspecified: Secondary | ICD-10-CM | POA: Insufficient documentation

## 2012-07-10 DIAGNOSIS — I10 Essential (primary) hypertension: Secondary | ICD-10-CM | POA: Insufficient documentation

## 2012-07-10 DIAGNOSIS — I4949 Other premature depolarization: Secondary | ICD-10-CM | POA: Insufficient documentation

## 2012-07-10 DIAGNOSIS — Z79899 Other long term (current) drug therapy: Secondary | ICD-10-CM | POA: Insufficient documentation

## 2012-07-10 DIAGNOSIS — E7800 Pure hypercholesterolemia, unspecified: Secondary | ICD-10-CM | POA: Insufficient documentation

## 2012-07-10 NOTE — Telephone Encounter (Signed)
Pt has hx of PVCs.  Pt noticed that heart was pounding tonight and checked her blood pressure - it was 125/68 with irreg heart rate 54.  Then she took the blood pressure again 134/72, then 140/85, then 134/88, HR 89.  She is concerned that her HR jumped that much.  She denies any chest pain.  She is due to take her PM meds (lisinopril and metop).  Advised pt to take her PM meds and recheck BP and HR in 45 mins.  If she continues to feel like heart is pounding and HR is irregular, advised pt to come to ED for evaluation.  She needs an EKG to r/o arrhythmia.  Will send a copy of this note to her cardiologist, Dr. Clifton James.  If pt decides not to come to ED tonight because things are improved, advised her to call PCP and Dr. Clifton James in am for follow up appts.

## 2012-07-10 NOTE — ED Notes (Signed)
PT. REPORTS PALPITATIONS / IRREGULAR HEART RATE ONSET THIS EVENING , DENIES CHEST PAIN OR SOB .

## 2012-07-11 ENCOUNTER — Encounter: Payer: Self-pay | Admitting: Internal Medicine

## 2012-07-11 ENCOUNTER — Emergency Department (HOSPITAL_COMMUNITY)
Admission: EM | Admit: 2012-07-11 | Discharge: 2012-07-11 | Disposition: A | Discharge status: Home / Self Care | Payer: Medicare Other | Attending: Emergency Medicine | Admitting: Emergency Medicine

## 2012-07-11 DIAGNOSIS — I493 Ventricular premature depolarization: Secondary | ICD-10-CM

## 2012-07-11 DIAGNOSIS — R002 Palpitations: Secondary | ICD-10-CM

## 2012-07-11 LAB — CBC WITH DIFFERENTIAL/PLATELET
Eosinophils Absolute: 0.1 10*3/uL (ref 0.0–0.7)
HCT: 39.1 % (ref 36.0–46.0)
Hemoglobin: 13.4 g/dL (ref 12.0–15.0)
Lymphs Abs: 1.7 10*3/uL (ref 0.7–4.0)
MCH: 30.5 pg (ref 26.0–34.0)
MCHC: 34.3 g/dL (ref 30.0–36.0)
MCV: 89.1 fL (ref 78.0–100.0)
Monocytes Absolute: 0.5 10*3/uL (ref 0.1–1.0)
Monocytes Relative: 7 % (ref 3–12)
Neutrophils Relative %: 65 % (ref 43–77)
RBC: 4.39 MIL/uL (ref 3.87–5.11)

## 2012-07-11 LAB — COMPREHENSIVE METABOLIC PANEL
Alkaline Phosphatase: 58 U/L (ref 39–117)
BUN: 13 mg/dL (ref 6–23)
Creatinine, Ser: 0.81 mg/dL (ref 0.50–1.10)
GFR calc Af Amer: 82 mL/min — ABNORMAL LOW (ref >90)
Glucose, Bld: 138 mg/dL — ABNORMAL HIGH (ref 70–99)
Potassium: 3.5 mEq/L (ref 3.5–5.1)
Total Bilirubin: 0.4 mg/dL (ref 0.3–1.2)
Total Protein: 6.4 g/dL (ref 6.0–8.3)

## 2012-07-11 NOTE — Telephone Encounter (Signed)
She was seen in the ED last night and her metoprolol was increased. Can we check on her to make sure she is doing ok? Thanks, chris

## 2012-07-11 NOTE — ED Provider Notes (Signed)
History     CSN: [id number]  Arrival date & time 07/10/12  2336   First MD Initiated Contact with Patient 07/11/12 0139      Chief Complaint  Patient presents with  . Palpitations    (Consider location/radiation/quality/duration/timing/severity/associated sxs/prior treatment) HPI Comments: Patient with history of pvc's.  Has seen Dr. Clifton James for this and is taking metoprolol.  This dose was decreased last month due to low heart rate.  Tonight she feels as if she is having skipped beats and her heart rate when she checks her blood pressure seems to vary between 60 and 90.  No chest pain or shortness of breath.  No fevers or chills.  Patient is a 72 y.o. female presenting with palpitations. The history is provided by the patient.  Palpitations Palpitations quality:  Regular Onset quality:  Sudden Duration:  2 days Timing:  Constant Progression:  Worsening Chronicity:  Recurrent Context: not anxiety, not caffeine and not nicotine   Relieved by:  Nothing Worsened by:  Nothing tried Ineffective treatments:  None tried   Past Medical History  Diagnosis Date  . Arrhythmia     PVC  . Hypercholesteremia   . Osteoporosis   . Anxiety   . Sleep disorder   . PUD (peptic ulcer disease)   . Appendicitis   . Hx of adenomatous colonic polyps   . Diverticulitis   . GERD (gastroesophageal reflux disease)   . IBS (irritable bowel syndrome)   . Fibromyalgia   . Hypertension   . Thyroid disease   . Palpitation   . Back abscess   . PVC (premature ventricular contraction)     Past Surgical History  Procedure Laterality Date  . Appendectomy    . Wrist surgery      left  . Carpal tunnel release      left  . Abdominal hysterectomy      Family History  Problem Relation Age of Onset  . Heart attack Maternal Grandfather 72  . Glaucoma Mother   . Macular degeneration Mother   . Lung cancer Father   . Lung cancer Brother   . Colon cancer Paternal Grandfather   . Tuberculosis  Paternal Grandfather     History  Substance Use Topics  . Smoking status: Never Smoker   . Smokeless tobacco: Never Used  . Alcohol Use: No    OB History   Grav Para Term Preterm Abortions TAB SAB Ect Mult Living                  Review of Systems  Cardiovascular: Positive for palpitations.  All other systems reviewed and are negative.    Allergies  Penicillins and Sulfonamide derivatives  Home Medications   Current Outpatient Rx  Name  Route  Sig  Dispense  Refill  . dicyclomine (BENTYL) 10 MG capsule      Take 1 tablet by mouth every 4-6 hours as needed for colon spasm   60 capsule   3   . doxepin (SINEQUAN) 10 MG capsule   Oral   Take 10 mg by mouth 2 (two) times daily.           Marland Kitchen doxycycline (VIBRA-TABS) 100 MG tablet   Oral   Take 1 tablet (100 mg total) by mouth 2 (two) times daily.   14 tablet   2   . ergocalciferol (VITAMIN D2) 50000 UNITS capsule   Oral   Take 50,000 Units by mouth once a week.           Marland Kitchen  esomeprazole (NEXIUM) 20 MG capsule   Oral   Take 1 capsule (20 mg total) by mouth daily.   30 capsule   3   . lisinopril (PRINIVIL,ZESTRIL) 5 MG tablet      TAKE 1 TABLET DAILY   90 tablet   6   . metoprolol (LOPRESSOR) 25 MG tablet   Oral   Take 1 tablet (25 mg total) by mouth 2 (two) times daily.   180 tablet   3   . raloxifene (EVISTA) 60 MG tablet   Oral   Take 60 mg by mouth daily.           . simvastatin (ZOCOR) 40 MG tablet   Oral   Take 40 mg by mouth at bedtime.             BP 152/74  Temp(Src) 98.1 F (36.7 C) (Oral)  Resp 18  SpO2 95%  Physical Exam  Nursing note and vitals reviewed. Constitutional: She is oriented to person, place, and time. She appears well-developed and well-nourished. No distress.  HENT:  Head: Normocephalic and atraumatic.  Neck: Normal range of motion. Neck supple.  Cardiovascular: Normal rate and regular rhythm.  Exam reveals no gallop and no friction rub.   No murmur  heard. Pulmonary/Chest: Effort normal and breath sounds normal. No respiratory distress. She has no wheezes.  Abdominal: Soft. Bowel sounds are normal. She exhibits no distension. There is no tenderness.  Musculoskeletal: Normal range of motion.  Neurological: She is alert and oriented to person, place, and time.  Skin: Skin is warm and dry. She is not diaphoretic.    ED Course  Procedures (including critical care time)  Labs Reviewed  COMPREHENSIVE METABOLIC PANEL - Abnormal; Notable for the following:    Glucose, Bld 138 (*)    GFR calc non Af Amer 71 (*)    GFR calc Af Amer 82 (*)    All other components within normal limits  CBC WITH DIFFERENTIAL   No results found.   No diagnosis found.   Date: 07/11/2012  Rate: 90  Rhythm: normal sinus rhythm with pvc's  QRS Axis: normal  Intervals: normal  ST/T Wave abnormalities: normal  Conduction Disutrbances:none  Narrative Interpretation:   Old EKG Reviewed: unchanged    MDM  The patient was observed on telemetry for a period of one hour and continued to have pvc's, but no other dysrhythmias.   I will advise her to increase the metoprolol to 50 mg at night and 25 mg in the AM.  Return prn.        Geoffery Lyons, MD 07/11/12 [phone]

## 2012-07-12 NOTE — Telephone Encounter (Signed)
Spoke with pt. She is doing well. Palpitations have improved. She has appt with Norma Fredrickson, NP on July 25, 2012

## 2012-07-17 ENCOUNTER — Encounter: Payer: Self-pay | Admitting: Internal Medicine

## 2012-07-17 ENCOUNTER — Telehealth: Payer: Self-pay | Admitting: Internal Medicine

## 2012-07-17 NOTE — Telephone Encounter (Signed)
See office visit note from 07/2011, please set the patient up for both endo/colon.

## 2012-07-17 NOTE — Telephone Encounter (Signed)
Patient has been scheduled

## 2012-07-23 ENCOUNTER — Ambulatory Visit (INDEPENDENT_AMBULATORY_CARE_PROVIDER_SITE_OTHER): Payer: Medicare Other | Admitting: General Surgery

## 2012-07-23 ENCOUNTER — Encounter (INDEPENDENT_AMBULATORY_CARE_PROVIDER_SITE_OTHER): Payer: Medicare Other | Admitting: General Surgery

## 2012-07-23 ENCOUNTER — Encounter (INDEPENDENT_AMBULATORY_CARE_PROVIDER_SITE_OTHER): Payer: Self-pay | Admitting: General Surgery

## 2012-07-23 VITALS — BP 130/78 | HR 72 | Temp 97.4°F | Resp 16 | Ht 63.25 in | Wt 178.2 lb

## 2012-07-23 DIAGNOSIS — L723 Sebaceous cyst: Secondary | ICD-10-CM

## 2012-07-23 NOTE — Patient Instructions (Signed)
Call the office when you are ready to schedule your surgery to remove the cyst.

## 2012-07-23 NOTE — Progress Notes (Signed)
[name] is a 72 y.o. female who is here for a follow up visit regarding her I&D of an infected seb cyst.  Objective: Filed Vitals:   07/23/12 1514  BP: 130/78  Pulse: 72  Temp: 97.4 F (36.3 C)  Resp: 16    General appearance: alert and cooperative inc: healing well   Assessment and Plan: Pt would like to get this surgically excised.  She will call in the fall when she is ready to get it done.     Vanita Panda, MD [facility], [place] [phone]

## 2012-07-25 ENCOUNTER — Ambulatory Visit (INDEPENDENT_AMBULATORY_CARE_PROVIDER_SITE_OTHER): Payer: Medicare Other | Admitting: Nurse Practitioner

## 2012-07-25 ENCOUNTER — Encounter: Payer: Self-pay | Admitting: Nurse Practitioner

## 2012-07-25 VITALS — BP 100/70 | HR 68 | Ht 63.75 in | Wt 179.0 lb

## 2012-07-25 DIAGNOSIS — I493 Ventricular premature depolarization: Secondary | ICD-10-CM

## 2012-07-25 DIAGNOSIS — I4949 Other premature depolarization: Secondary | ICD-10-CM

## 2012-07-25 NOTE — Patient Instructions (Signed)
Stop the Lisinopril for now  Stay on your other medicines  Monitor your blood pressure at home and keep a record  I will see you in a month  Avoid soda/caffeine  Call the [facility] office at [phone] if you have any questions, problems or concerns.

## 2012-07-25 NOTE — Progress Notes (Signed)
Roger Shelter Date of Birth: 02-14-1941 Medical Record #[id number]  History of Present Illness: Ms. Safley is seen back today for a post hospital visit. Seen for Dr. Clifton James. Has a history of PVC's, HLD, multiple GI issues, fibromyalgia, IBS, HTN, past low normal EF but with echo in 10/12 her EF had improved to 55% with mild LVH, mild MR and mild AI. Remote 48 hour Holter with frequent PVCs, trigeminy and bigeminy.   Most recently in the ER with palpitations. Had had her dose decreased due to low heart rate last month that had precipitated an ER visit while out of town. ER evaluation this time was benign - her metoprolol was increased back to 50 mg at HS and 25 mg in the AM. Dr. Waynard Edwards advised that she switch this and take the 50 mg in the am and the 25 mg at night.   Comes back today. She is here alone. She does feel better. Not lightheaded or dizzy. Not noticing the pounding of her heart. BP is running low now. She feels tired with this. Looking back now at this most recent episode, she had overeaten and had Dr. Reino Kent. She has since not drank Dr. Reino Kent. She is trying to work on her weight. Has joined [employer].    Current Outpatient Prescriptions on File Prior to Visit  Medication Sig Dispense Refill  . desoximetasone (TOPICORT) 0.25 % cream Apply 1 application topically 2 (two) times daily. To rash on shins and feet      . dicyclomine (BENTYL) 10 MG capsule Take 1 tablet by mouth every 4-6 hours as needed for colon spasm  60 capsule  3  . doxepin (SINEQUAN) 10 MG capsule Take 20 mg by mouth at bedtime.       . ergocalciferol (VITAMIN D2) 50000 UNITS capsule Take 50,000 Units by mouth once a week. On Saturday, The last week of each month takes an additional dose on the last Monday of the month      . esomeprazole (NEXIUM) 20 MG capsule Take 1 capsule (20 mg total) by mouth daily.  30 capsule  3  . lisinopril (PRINIVIL,ZESTRIL) 5 MG tablet TAKE 1 TABLET DAILY  90 tablet  6  .  metoprolol (LOPRESSOR) 50 MG tablet Take 50 mg by mouth daily after breakfast.      . metoprolol tartrate (LOPRESSOR) 25 MG tablet Take 25 mg by mouth at bedtime.       . Probiotic Product (ALIGN) 4 MG CAPS Take 1 capsule by mouth daily as needed (for stomach upset and diarrhea).      . raloxifene (EVISTA) 60 MG tablet Take 60 mg by mouth daily.       . simvastatin (ZOCOR) 40 MG tablet Take 40 mg by mouth at bedtime.         No current facility-administered medications on file prior to visit.    Allergies  Allergen Reactions  . Penicillins Swelling    By second day there was swelling of tongue and could not breathe  . Sulfonamide Derivatives Hives and Rash    Where applied.  . Metronidazole Diarrhea  . Prednisone Other (See Comments)    Due to cataract on eye and history of PVC's    Past Medical History  Diagnosis Date  . Arrhythmia     PVC  . Hypercholesteremia   . Osteoporosis   . Anxiety   . Sleep disorder   . PUD (peptic ulcer disease)   . Appendicitis   .  Hx of adenomatous colonic polyps   . Diverticulitis   . GERD (gastroesophageal reflux disease)   . IBS (irritable bowel syndrome)   . Fibromyalgia   . Hypertension   . Thyroid disease   . Palpitation   . Back abscess   . PVC (premature ventricular contraction)     Past Surgical History  Procedure Laterality Date  . Appendectomy    . Wrist surgery      left  . Carpal tunnel release      left  . Abdominal hysterectomy      History  Smoking status  . Never Smoker   Smokeless tobacco  . Never Used    History  Alcohol Use No    Family History  Problem Relation Age of Onset  . Heart attack Maternal Grandfather 72  . Glaucoma Mother   . Macular degeneration Mother   . Lung cancer Father   . Lung cancer Brother   . Colon cancer Paternal Grandfather   . Tuberculosis Paternal Grandfather     Review of Systems: The review of systems is per the HPI.  All other systems were reviewed and are  negative.  Physical Exam: BP 100/70  Pulse 68  Ht 5' 3.75" (1.619 m)  Wt 179 lb (81.194 kg)  BMI 30.98 kg/m2 Patient is very pleasant and in no acute distress. Skin is warm and dry. Color is normal.  HEENT is unremarkable. Normocephalic/atraumatic. PERRL. Sclera are nonicteric. Neck is supple. No masses. No JVD. Lungs are clear. Cardiac exam shows a regular rate and rhythm. Abdomen is soft. Extremities are without edema. Gait and ROM are intact. No gross neurologic deficits noted.  LABORATORY DATA:  Lab Results  Component Value Date   WBC 6.6 07/10/2012   HGB 13.4 07/10/2012   HCT 39.1 07/10/2012   PLT 215 07/10/2012   GLUCOSE 138* 07/10/2012   ALT 19 07/10/2012   AST 24 07/10/2012   NA 142 07/10/2012   K 3.5 07/10/2012   CL 107 07/10/2012   CREATININE 0.81 07/10/2012   BUN 13 07/10/2012   CO2 24 07/10/2012    Assessment / Plan: 1. Palpitations with documented PVC's - probably related to her caffeine intake - she is doing much better. Would leave her on her current regimen. May need to see EP if problems persist.   2. HTN - blood pressure now running low. She does not want to cut back the beta blocker as it will aggravate her PVC's. I have stopped the Lisinopril. She will monitor her BP at home. See her back in a month to reassess. Continue to focus on CV risk factor modification as well.   Patient is agreeable to this plan and will call if any problems develop in the interim.    Rosalio Macadamia, RN, ANP-C [facility] [address] [place], [place]  [address]

## 2012-09-05 ENCOUNTER — Ambulatory Visit (AMBULATORY_SURGERY_CENTER): Payer: Medicare Other | Admitting: *Deleted

## 2012-09-05 VITALS — Ht 63.75 in | Wt 170.6 lb

## 2012-09-05 DIAGNOSIS — Z86010 Personal history of colonic polyps: Secondary | ICD-10-CM

## 2012-09-05 DIAGNOSIS — K219 Gastro-esophageal reflux disease without esophagitis: Secondary | ICD-10-CM

## 2012-09-05 MED ORDER — MOVIPREP 100 G PO SOLR: ORAL | Status: DC

## 2012-09-06 ENCOUNTER — Encounter: Payer: Self-pay | Admitting: Internal Medicine

## 2012-09-11 ENCOUNTER — Ambulatory Visit (INDEPENDENT_AMBULATORY_CARE_PROVIDER_SITE_OTHER): Payer: Medicare Other | Admitting: Nurse Practitioner

## 2012-09-11 ENCOUNTER — Encounter: Payer: Self-pay | Admitting: Nurse Practitioner

## 2012-09-11 VITALS — BP 118/78 | HR 68 | Ht 63.0 in | Wt 169.8 lb

## 2012-09-11 DIAGNOSIS — I493 Ventricular premature depolarization: Secondary | ICD-10-CM

## 2012-09-11 DIAGNOSIS — I4949 Other premature depolarization: Secondary | ICD-10-CM

## 2012-09-11 DIAGNOSIS — I10 Essential (primary) hypertension: Secondary | ICD-10-CM

## 2012-09-11 NOTE — Progress Notes (Signed)
Roger Shelter Date of Birth: Feb 07, 1941 Medical Record #[id number]  History of Present Illness: Ms. Heimsoth is seen back today for a 6 week check. Seen for Dr. Clifton James. Has a history of PVCs, HLD, multiple GI issues, fibromyalgia, IBS, HTN, past low normal EF but with echo in 11/2010 with improvement in her EF to 55% with mild LVH, mild MR and mild AI. Remote 48 hour Holter showed frequent PVCs, trigeminy and bigeminy.   I saw her 6 weeks ago. She had been in the ER with palpitations and had her beta blocker increased. BP was running low and she was symptomatic. We stopped her Lisinopril. She was trying to get on track with diet/weight loss, etc.   Comes back today. Here alone. Doing great!!. Has joined [employer] and already lost 10 pounds. Feels so much better. Has cut her beta blocker back to 25 mg BID due to her BP being lower and being a little dizzy. She will note the palpitations if she gets too hot but has tried to make more of a conscious effort to drink water. She has changed her diet considerably.    Current Outpatient Prescriptions  Medication Sig Dispense Refill  . desoximetasone (TOPICORT) 0.25 % cream Apply 1 application topically 2 (two) times daily. To rash on shins and feet      . dicyclomine (BENTYL) 10 MG capsule Take 1 tablet by mouth every 4-6 hours as needed for colon spasm  60 capsule  3  . doxepin (SINEQUAN) 10 MG capsule Take 20 mg by mouth at bedtime.       . ergocalciferol (VITAMIN D2) 50000 UNITS capsule Take 50,000 Units by mouth once a week. On Saturday, The last week of each month takes an additional dose on the last Monday of the month      . esomeprazole (NEXIUM) 20 MG capsule Take 1 capsule (20 mg total) by mouth daily.  30 capsule  3  . metoprolol tartrate (LOPRESSOR) 25 MG tablet Take 25 mg by mouth 2 (two) times daily.       Marland Kitchen MOVIPREP 100 G SOLR moviprep as directed. No substitutions  1 kit  0  . Probiotic Product (ALIGN) 4 MG CAPS Take 1  capsule by mouth daily as needed (for stomach upset and diarrhea).      . raloxifene (EVISTA) 60 MG tablet Take 60 mg by mouth daily.       . simvastatin (ZOCOR) 40 MG tablet Take 40 mg by mouth at bedtime.         No current facility-administered medications for this visit.    Allergies  Allergen Reactions  . Penicillins Swelling    By second day there was swelling of tongue and could not breathe  . Sulfonamide Derivatives Hives and Rash    Where applied.  . Metronidazole Diarrhea  . Prednisone Other (See Comments)    Due to cataract on eye and history of PVC's    Past Medical History  Diagnosis Date  . Arrhythmia     PVC  . Hypercholesteremia   . Osteoporosis   . Anxiety   . Sleep disorder   . PUD (peptic ulcer disease)   . Appendicitis   . Hx of adenomatous colonic polyps   . Diverticulitis   . GERD (gastroesophageal reflux disease)   . IBS (irritable bowel syndrome)   . Fibromyalgia   . Hypertension   . Thyroid disease   . Palpitation   . Back abscess   .  PVC (premature ventricular contraction)     Past Surgical History  Procedure Laterality Date  . Appendectomy    . Wrist surgery      left  . Carpal tunnel release      left  . Abdominal hysterectomy      History  Smoking status  . Never Smoker   Smokeless tobacco  . Never Used    History  Alcohol Use No    Family History  Problem Relation Age of Onset  . Heart attack Maternal Grandfather 72  . Glaucoma Mother   . Macular degeneration Mother   . Lung cancer Father   . Lung cancer Brother   . Tuberculosis Paternal Grandfather   . Colon cancer Paternal Grandmother 59    Review of Systems: The review of systems is per the HPI.  All other systems were reviewed and are negative.  Physical Exam: BP 118/78  Pulse 68  Ht 5\' 3"  (1.6 m)  Wt 169 lb 12.8 oz (77.021 kg)  BMI 30.09 kg/m2 Patient is very pleasant and in no acute distress. Her weight is down 10 pounds. Skin is warm and dry. Color  is normal.  HEENT is unremarkable. Normocephalic/atraumatic. PERRL. Sclera are nonicteric. Neck is supple. No masses. No JVD. Lungs are clear. Cardiac exam shows a regular rate and rhythm. Abdomen is soft. Extremities are without edema. Gait and ROM are intact. No gross neurologic deficits noted.  LABORATORY DATA:  Lab Results  Component Value Date   WBC 6.6 07/10/2012   HGB 13.4 07/10/2012   HCT 39.1 07/10/2012   PLT 215 07/10/2012   GLUCOSE 138* 07/10/2012   ALT 19 07/10/2012   AST 24 07/10/2012   NA 142 07/10/2012   K 3.5 07/10/2012   CL 107 07/10/2012   CREATININE 0.81 07/10/2012   BUN 13 07/10/2012   CO2 24 07/10/2012    Assessment / Plan: 1. HTN - BP diary from home looks great. She is off of her ACE and on lower dose of her beta blocker. She will continue to monitor.   2. PVC's/palpitations - not really bothersome to her. She has stopped all of her caffeine.   3. Obesity - actively losing weight. She is quite motivated to continue to make changes.   We will see her back as planned in October.   Patient is agreeable to this plan and will call if any problems develop in the interim.   Rosalio Macadamia, RN, ANP-C [facility] [address] [place], [place]  [address]

## 2012-09-11 NOTE — Patient Instructions (Addendum)
I think you are doing great!!!  Stay on your current medicines  We will see you in 6 months  Call the [facility] office at [phone] if you have any questions, problems or concerns.

## 2012-09-14 ENCOUNTER — Encounter: Payer: Medicare Other | Admitting: Internal Medicine

## 2012-09-19 ENCOUNTER — Ambulatory Visit (AMBULATORY_SURGERY_CENTER): Payer: Medicare Other | Admitting: Internal Medicine

## 2012-09-19 ENCOUNTER — Encounter: Payer: Self-pay | Admitting: Internal Medicine

## 2012-09-19 VITALS — BP 117/76 | HR 61 | Temp 98.7°F | Resp 25 | Ht 63.0 in | Wt 170.0 lb

## 2012-09-19 DIAGNOSIS — Z800 Family history of malignant neoplasm of digestive organs: Secondary | ICD-10-CM

## 2012-09-19 DIAGNOSIS — D131 Benign neoplasm of stomach: Secondary | ICD-10-CM

## 2012-09-19 DIAGNOSIS — K219 Gastro-esophageal reflux disease without esophagitis: Secondary | ICD-10-CM

## 2012-09-19 DIAGNOSIS — Z860100 Personal history of colon polyps, unspecified: Secondary | ICD-10-CM

## 2012-09-19 DIAGNOSIS — K222 Esophageal obstruction: Secondary | ICD-10-CM

## 2012-09-19 DIAGNOSIS — K317 Polyp of stomach and duodenum: Secondary | ICD-10-CM

## 2012-09-19 DIAGNOSIS — R1319 Other dysphagia: Secondary | ICD-10-CM

## 2012-09-19 MED ORDER — SODIUM CHLORIDE 0.9 % IV SOLN: 500.0000 mL | INTRAVENOUS | Status: DC

## 2012-09-19 NOTE — Patient Instructions (Addendum)
YOU HAD AN ENDOSCOPIC PROCEDURE TODAY AT THE [facility]: Refer to the procedure report that was given to you for any specific questions about what was found during the examination.  If the procedure report does not answer your questions, please call your gastroenterologist to clarify.  If you requested that your care partner not be given the details of your procedure findings, then the procedure report has been included in a sealed envelope for you to review at your convenience later.  YOU SHOULD EXPECT: Some feelings of bloating in the abdomen. Passage of more gas than usual.  Walking can help get rid of the air that was put into your GI tract during the procedure and reduce the bloating. If you had a lower endoscopy (such as a colonoscopy or flexible sigmoidoscopy) you may notice spotting of blood in your stool or on the toilet paper. If you underwent a bowel prep for your procedure, then you may not have a normal bowel movement for a few days.  DIET:  You may have clear liquids at 1pm, and if they are tolerated, you may proceed to a soft diet for the rest of today..  Drink plenty of fluids but you should avoid alcoholic beverages for 24 hours. USE ONE HEAPING TEASPOONFUL OF BENEFIBER PER DAY PER DR. Juanda Chance.  ACTIVITY: Your care partner should take you home directly after the procedure.  You should plan to take it easy, moving slowly for the rest of the day.  You can resume normal activity the day after the procedure however you should NOT DRIVE or use heavy machinery for 24 hours (because of the sedation medicines used during the test).    SYMPTOMS TO REPORT IMMEDIATELY: A gastroenterologist can be reached at any hour.  During normal business hours, 8:30 AM to 5:00 PM Monday through Friday, call [phone].  After hours and on weekends, please call the GI answering service at [phone] who will take a message and have the physician on call contact you.   Following lower  endoscopy (colonoscopy or flexible sigmoidoscopy):  Excessive amounts of blood in the stool  Significant tenderness or worsening of abdominal pains  Swelling of the abdomen that is new, acute  Fever of 100F or higher  Following upper endoscopy (EGD)  Vomiting of blood or coffee ground material  New chest pain or pain under the shoulder blades  Painful or persistently difficult swallowing  New shortness of breath  Fever of 100F or higher  Black, tarry-looking stools  FOLLOW UP: If any biopsies were taken you will be contacted by phone or by letter within the next 1-3 weeks.  Call your gastroenterologist if you have not heard about the biopsies in 3 weeks.  Our staff will call the home number listed on your records the next business day following your procedure to check on you and address any questions or concerns that you may have at that time regarding the information given to you following your procedure. This is a courtesy call and so if there is no answer at the home number and we have not heard from you through the emergency physician on call, we will assume that you have returned to your regular daily activities without incident.  SIGNATURES/CONFIDENTIALITY: You and/or your care partner have signed paperwork which will be entered into your electronic medical record.  These signatures attest to the fact that that the information above on your After Visit Summary has been reviewed and is understood.  Full responsibility  of the confidentiality of this discharge information lies with you and/or your care-partner.

## 2012-09-19 NOTE — Progress Notes (Signed)
Patient did not have preoperative order for IV antibiotic SSI prophylaxis. (G8918)  Patient did not experience any of the following events: a burn prior to discharge; a fall within the facility; wrong site/side/patient/procedure/implant event; or a hospital transfer or hospital admission upon discharge from the facility. (G8907)  

## 2012-09-19 NOTE — Progress Notes (Signed)
Called to room to assist during endoscopic procedure.  Patient ID and intended procedure confirmed with present staff. Received instructions for my participation in the procedure from the performing physician.  

## 2012-09-19 NOTE — Op Note (Signed)
[facility] [address] [place], [address]   COLONOSCOPY PROCEDURE REPORT  PATIENT: [name], [name]  MR#: [id number] BIRTHDATE: 12-21-1940 , 72  yrs. old GENDER: Female ENDOSCOPIST: Hart Carwin, MD REFERRED XB:JYNW Perini, M.D. PROCEDURE DATE:  09/19/2012 PROCEDURE:   Colonoscopy, screening First Screening Colonoscopy - Avg.  risk and is 50 yrs.  old or older - No.  Prior Negative Screening - Now for repeat screening. N/A  History of Adenoma - Now for follow-up colonoscopy & has been > or = to 3 yrs.  Yes hx of adenoma.  Has been 3 or more years since last colonoscopy.  Polyps Removed Today? Yes. ASA CLASS:   Class II INDICATIONS:Patient's immediate family history of colon cancer and adenomatous polypd in [id number], parent with colon cancer. MEDICATIONS: MAC sedation, administered by CRNA and propofol (Diprivan) 100mg  IV  DESCRIPTION OF PROCEDURE:   After the risks benefits and alternatives of the procedure were thoroughly explained, informed consent was obtained.  A digital rectal exam revealed no abnormalities of the rectum.   The LB [id number]  endoscope was introduced through the anus and advanced to the cecum, which was identified by both the appendix and ileocecal valve. No adverse events experienced.   The quality of the prep was good, using MoviPrep  The instrument was then slowly withdrawn as the colon was fully examined.      COLON FINDINGS: There was severe diverticulosis noted in the sigmoid colon with associated angulation, tortuosity, muscular hypertrophy, colonic narrowing and colonic spasm.  Retroflexed views revealed no abnormalities. The time to cecum=  .  Withdrawal time=  .  The scope was withdrawn and the procedure completed. COMPLICATIONS: There were no complications.  ENDOSCOPIC IMPRESSION: There was severe diverticulosis noted in the sigmoid colon , tortuous colon with partial  obstruction,  RECOMMENDATIONS: High fiber diet Metamucil 1 tsp daily Recall colonoscopy in 5 years if medically stable   eSigned:  Hart Carwin, MD 09/19/2012 12:00 PM   cc:   PATIENT NAME:  [name], [name] MR#: [id number]

## 2012-09-19 NOTE — Op Note (Signed)
[facility] [address] [place], [address]   ENDOSCOPY PROCEDURE REPORT  PATIENT: Kaitlyn, Mendoza  MR#: [id number] BIRTHDATE: August 11, 1940 , 72  yrs. old GENDER: Female ENDOSCOPIST: Hart Carwin, MD REFERRED BY:  Rodrigo Ran, M.D. PROCEDURE DATE:  09/19/2012 PROCEDURE:  EGD w/ biopsy , EGD w/ snare technique , and Maloney dilation of esophagus ASA CLASS:     Class II INDICATIONS:  Dysphagia.   gastric polyp(s).   History of esophageal reflux.   last EGD 07/2007, had H.pylori treated. MEDICATIONS: MAC sedation, administered by CRNA and propofol (Diprivan) 100mg  IV TOPICAL ANESTHETIC: none  DESCRIPTION OF PROCEDURE: After the risks benefits and alternatives of the procedure were thoroughly explained, informed consent was obtained.  The LB [id number] endoscope was introduced through the mouth and advanced to the second portion of the duodenum. Without limitations.  The instrument was slowly withdrawn as the mucosa was fully examined.      esophagus: Proximal mid and distal esophageal mucosa appeared normal. There was a fibrous benign appearing nonobstructing esophageal ring consistent with a mild stricture. Endoscope traversed without resistance. Distal to the stricture was a small reducible hiatal hernia Stomach: Multiple fundic gland polyps in the gastric body. There was a large 1 cm multilobulated polyp on a stalk in gastric body as was snare developed snare and recovered and sent to pathology. It was no bleeding from the post-polypectomy site. Abscess were taken from fundic gland polyps. Gastric antrum and pyloric outlet were unremarkable Duodenum: Descending duodenum and duodenal bulb were unremarkable 48 French Maloney dilator passed with ease through the distal esophagus. There was no blood on the dilator[         The scope was then withdrawn from the patient and the procedure completed.  COMPLICATIONS: There were no  complications. ENDOSCOPIC IMPRESSION:  benign nonobstructing distal esophageal stricture. Status post passage 90 French Maloney dilator Multiple fundic gland polyps in gastric body and this was biopsies Large pedunculated multilobulated polyp in gastric body removed with hot snare  RECOMMENDATIONS: 1.  Await pathology results 2.  Anti-reflux regimen to be follow 3.  Continue PPI  REPEAT EXAM: no recall  eSigned:  Hart Carwin, MD 09/19/2012 11:52 AM   CC:  PATIENT NAME:  Kaitlyn, Mendoza MR#: [id number]

## 2012-09-19 NOTE — Progress Notes (Signed)
Lidocaine-40mg IV prior to Propofol InductionPropofol given over incremental dosages 

## 2012-09-20 ENCOUNTER — Telehealth: Payer: Self-pay | Admitting: *Deleted

## 2012-09-20 NOTE — Telephone Encounter (Signed)
No answer, message left for the patient. 

## 2012-09-25 ENCOUNTER — Encounter: Payer: Self-pay | Admitting: Internal Medicine

## 2012-09-26 ENCOUNTER — Other Ambulatory Visit: Payer: Self-pay

## 2012-09-27 ENCOUNTER — Encounter: Payer: Self-pay | Admitting: Cardiovascular Disease

## 2012-12-12 ENCOUNTER — Ambulatory Visit: Payer: Medicare Other | Admitting: Cardiovascular Disease

## 2012-12-18 ENCOUNTER — Ambulatory Visit (INDEPENDENT_AMBULATORY_CARE_PROVIDER_SITE_OTHER): Payer: Medicare Other | Admitting: Cardiovascular Disease

## 2012-12-18 ENCOUNTER — Encounter: Payer: Self-pay | Admitting: Cardiovascular Disease

## 2012-12-18 VITALS — BP 120/72 | HR 88 | Ht 63.0 in | Wt 158.0 lb

## 2012-12-18 DIAGNOSIS — R42 Dizziness and giddiness: Secondary | ICD-10-CM

## 2012-12-18 DIAGNOSIS — I10 Essential (primary) hypertension: Secondary | ICD-10-CM

## 2012-12-18 DIAGNOSIS — I493 Ventricular premature depolarization: Secondary | ICD-10-CM

## 2012-12-18 DIAGNOSIS — I4949 Other premature depolarization: Secondary | ICD-10-CM

## 2012-12-18 MED ORDER — MECLIZINE HCL 25 MG PO TABS: 25.0000 mg | ORAL_TABLET | Freq: Every day | ORAL | Status: DC | PRN

## 2012-12-18 MED ORDER — ASPIRIN EC 81 MG PO TBEC: 81.0000 mg | DELAYED_RELEASE_TABLET | Freq: Every day | ORAL | Status: AC

## 2012-12-18 MED ORDER — METOPROLOL TARTRATE 25 MG PO TABS: 25.0000 mg | ORAL_TABLET | Freq: Two times a day (BID) | ORAL | Status: DC

## 2012-12-18 NOTE — Progress Notes (Signed)
History of Present Illness: 72 yo WF with history of hyperlipidemia, HTN, low normal LV function by echo October 2011 and multiple GI issues including diverticulosis, PUD and GERD along with IBS and fibromyalgia who is here today for cardiac follow up. She was seen as a new patient for further evaluation of palpitations on 05/18/09. She has had known PVCs for many years. Echo in October 2011 with mild global hypokinesis with EF of 45-50%. Her 48 hour Holter monitor showed frequent PVCs with some trigeminy and bigeminy. Her beta blocker was increased and she cut out caffeine.  She has been on Lisinopril. In May 2013, the patient was cleaning her mother's house in [place] and developed 6 days of dizziness with moving around and ringing in her ears. She started keeping track of her blood pressure and it would fluctuate between 105/60 and 143/93. She finally went to the emergency room and was  diagnosed with vertigo and she was placed on meclizine. They did a CT of her brain which was reportedly normal. They also gave her IV fluids. They decreased her metoprolol to 25 mg b.i.d. And told her she could take an extra 25 mg of for palpitations. Repeat echo 10/12 with LVEF 55%, mild LVH, mild MR, mild AI.   She is here today for cardiac follow up. No chest pain, SOB, near syncope or syncope. She has lost 22 lbs since last visit. She is feeling well. Rare palpitations. BP has been well controlled at home.   Primary Care Physician: Rodrigo Ran   Past Medical History  Diagnosis Date  . Arrhythmia     PVC  . Hypercholesteremia   . Osteoporosis   . Anxiety   . Sleep disorder   . PUD (peptic ulcer disease)   . Appendicitis   . Hx of adenomatous colonic polyps   . Diverticulitis   . GERD (gastroesophageal reflux disease)   . IBS (irritable bowel syndrome)   . Fibromyalgia   . Hypertension   . Thyroid disease   . Palpitation   . Back abscess   . PVC (premature ventricular contraction)     Past  Surgical History  Procedure Laterality Date  . Appendectomy    . Wrist surgery      left  . Carpal tunnel release      left  . Abdominal hysterectomy      Current Outpatient Prescriptions  Medication Sig Dispense Refill  . desoximetasone (TOPICORT) 0.25 % cream Apply 1 application topically 2 (two) times daily. To rash on shins and feet      . dicyclomine (BENTYL) 10 MG capsule Take 1 tablet by mouth every 4-6 hours as needed for colon spasm  60 capsule  3  . doxepin (SINEQUAN) 10 MG capsule Take 20 mg by mouth at bedtime.       . ergocalciferol (VITAMIN D2) 50000 UNITS capsule Take 50,000 Units by mouth once a week. On Saturday, The last week of each month takes an additional dose on the last Monday of the month      . esomeprazole (NEXIUM) 20 MG capsule Take 1 capsule (20 mg total) by mouth daily.  30 capsule  3  . metoprolol tartrate (LOPRESSOR) 25 MG tablet Take 25 mg by mouth 2 (two) times daily.       . Probiotic Product (ALIGN) 4 MG CAPS Take 1 capsule by mouth daily as needed (for stomach upset and diarrhea).      . raloxifene (EVISTA) 60 MG  tablet Take 60 mg by mouth daily.       . simvastatin (ZOCOR) 40 MG tablet Take 40 mg by mouth at bedtime.         No current facility-administered medications for this visit.    Allergies  Allergen Reactions  . Penicillins Swelling    By second day there was swelling of tongue and could not breathe  . Sulfonamide Derivatives Hives and Rash    Where applied.  . Metronidazole Diarrhea  . Prednisone Other (See Comments)    Due to cataract on eye and history of PVC's    History   Social History  . Marital Status: Widowed    Spouse Name: N/A    Number of Children: N/A  . Years of Education: N/A   Occupational History  . Not on file.   Social History Main Topics  . Smoking status: Never Smoker   . Smokeless tobacco: Never Used  . Alcohol Use: No  . Drug Use: No  . Sexual Activity: Not on file   Other Topics Concern  .  Not on file   Social History Narrative  . No narrative on file    Family History  Problem Relation Age of Onset  . Heart attack Maternal Grandfather 72  . Glaucoma Mother   . Macular degeneration Mother   . Lung cancer Father   . Lung cancer Brother   . Tuberculosis Paternal Grandfather   . Colon cancer Paternal Grandmother 86    Review of Systems:  As stated in the HPI and otherwise negative.   BP 120/72  Pulse 88  Ht 5\' 3"  (1.6 m)  Wt 158 lb (71.668 kg)  BMI 28 kg/m2  SpO2 97%  Physical Examination: General: Well developed, well nourished, NAD HEENT: OP clear, mucus membranes moist SKIN: warm, dry. No rashes. Neuro: No focal deficits Musculoskeletal: Muscle strength 5/5 all ext Psychiatric: Mood and affect normal Neck: No JVD, no carotid bruits, no thyromegaly, no lymphadenopathy. Lungs:Clear bilaterally, no wheezes, rhonci, crackles Cardiovascular: Regular rate and rhythm. No murmurs, gallops or rubs. Abdomen:Soft. Bowel sounds present. Non-tender.  Extremities: No lower extremity edema. Pulses are 2 + in the bilateral DP/PT.  Assessment and Plan:   1. PVCs: Asymptomatic. Will continue Metoprolol 25 mg po BID. No changes today. LV function normal. Dizziness occasionally. Continue meclizine prn.   2. Visual changes: Possible TIA. Resolved. Will start ASA 81 mg po Qdaily. Will arrange carotid artery dopplers to exclude carotid artery disease.

## 2012-12-18 NOTE — Patient Instructions (Signed)
Your physician wants you to follow-up in:  12 months. You will receive a reminder letter in the mail two months in advance. If you don't receive a letter, please call our office to schedule the follow-up appointment.  Your physician has requested that you have a carotid duplex. This test is an ultrasound of the carotid arteries in your neck. It looks at blood flow through these arteries that supply the brain with blood. Allow one hour for this exam. There are no restrictions or special instructions.  Your physician has recommended you make the following change in your medication: Start aspirin 81 mg by mouth daily

## 2012-12-27 ENCOUNTER — Other Ambulatory Visit: Payer: Self-pay

## 2013-02-27 ENCOUNTER — Ambulatory Visit (HOSPITAL_COMMUNITY): Payer: Medicare Other | Attending: Cardiovascular Disease

## 2013-02-27 DIAGNOSIS — I10 Essential (primary) hypertension: Secondary | ICD-10-CM | POA: Insufficient documentation

## 2013-02-27 DIAGNOSIS — I658 Occlusion and stenosis of other precerebral arteries: Secondary | ICD-10-CM | POA: Insufficient documentation

## 2013-02-27 DIAGNOSIS — E785 Hyperlipidemia, unspecified: Secondary | ICD-10-CM | POA: Insufficient documentation

## 2013-02-27 DIAGNOSIS — I6529 Occlusion and stenosis of unspecified carotid artery: Secondary | ICD-10-CM | POA: Insufficient documentation

## 2013-02-27 DIAGNOSIS — R42 Dizziness and giddiness: Secondary | ICD-10-CM | POA: Insufficient documentation

## 2013-02-28 ENCOUNTER — Telehealth: Payer: Self-pay | Admitting: Cardiovascular Disease

## 2013-02-28 NOTE — Telephone Encounter (Signed)
Follow Up ° °Pt returned call//  °

## 2013-02-28 NOTE — Telephone Encounter (Signed)
Spoke with pt, aware of carotid doppler results. 

## 2013-03-06 ENCOUNTER — Other Ambulatory Visit: Payer: Self-pay

## 2013-03-06 DIAGNOSIS — Z1231 Encounter for screening mammogram for malignant neoplasm of breast: Secondary | ICD-10-CM

## 2013-03-27 ENCOUNTER — Ambulatory Visit
Admission: RE | Admit: 2013-03-27 | Discharge: 2013-03-27 | Disposition: A | Discharge status: Home / Self Care | Payer: Medicare Other | Source: Ambulatory Visit

## 2013-03-27 DIAGNOSIS — Z1231 Encounter for screening mammogram for malignant neoplasm of breast: Secondary | ICD-10-CM

## 2013-04-25 ENCOUNTER — Other Ambulatory Visit: Payer: Self-pay

## 2013-04-25 DIAGNOSIS — I10 Essential (primary) hypertension: Secondary | ICD-10-CM

## 2013-04-25 DIAGNOSIS — R42 Dizziness and giddiness: Secondary | ICD-10-CM

## 2013-04-25 DIAGNOSIS — I493 Ventricular premature depolarization: Secondary | ICD-10-CM

## 2013-04-25 MED ORDER — MECLIZINE HCL 25 MG PO TABS: 25.0000 mg | ORAL_TABLET | Freq: Every day | ORAL | Status: DC | PRN

## 2013-04-25 MED ORDER — METOPROLOL TARTRATE 25 MG PO TABS: 25.0000 mg | ORAL_TABLET | Freq: Two times a day (BID) | ORAL | Status: DC

## 2013-07-02 ENCOUNTER — Other Ambulatory Visit: Payer: Self-pay | Admitting: Endocrinology

## 2013-07-02 DIAGNOSIS — K5792 Diverticulitis of intestine, part unspecified, without perforation or abscess without bleeding: Secondary | ICD-10-CM

## 2013-07-05 ENCOUNTER — Ambulatory Visit
Admission: RE | Admit: 2013-07-05 | Discharge: 2013-07-05 | Disposition: A | Discharge status: Home / Self Care | Payer: PRIVATE HEALTH INSURANCE | Source: Ambulatory Visit | Attending: Endocrinology | Admitting: Endocrinology

## 2013-07-05 DIAGNOSIS — K5792 Diverticulitis of intestine, part unspecified, without perforation or abscess without bleeding: Secondary | ICD-10-CM

## 2013-07-09 ENCOUNTER — Telehealth: Payer: Self-pay | Admitting: Cardiovascular Disease

## 2013-07-09 NOTE — Telephone Encounter (Signed)
New Message:  Pt states last night around 10 pm her heart rate was 105 and BP was 119/73. States she took metropolol... Pt states it took around 2 am until 111/58 pulse of 69.. States she had two episodes of really bad diarrhea.. States she had a unusual sensation in her chest. States it felt like her nerve endings were tingling... She also had dizziness and anxiety... Then BP of 128/73 and pulse of 68  Earlier this morning.. Then 112/71 pulse 75 today as well. Pt states she checked her temperature and did not have a fever...   Pt is requesting a call back to assess her symptoms and to see if she can be worked in this week to see Dr. Angelena Form

## 2013-07-09 NOTE — Telephone Encounter (Signed)
Patient called with reports of "unusual" episode yesterday around 10:00 am characterized by her heart "beating very strongly in my chest". She states she took her BP 119/73, HR 105 with a very mild clamminess feeling that lasted no more than a couple of minutes. Denies SOB, swelling, fever, chest pressure or radiating pain. However, she states that she did feel a tingling sensation to her left chest cavity area "but not in my heart" .  She said it felt like nerve-ending pains. She repeated BP readings throughout the day/night - 111/58, 69 @ 2:00 pm and 128/73, 68 at 5:00 pm today and 112/71, 75 at 11:00 am today.  She states that currently she feels okay and is having no "sensations" or discomforts but is worried about what could be going on. Patient states she lives alone and does experience anxiety and currently does have quite a few stressors in her life.  She called her PCP but she stated that he advised her to call her Cardiologist.  She is wanting advisement on whether this sounds "cardiac" and if she needs an appointment with Dr. Angelena Form. Advised that we could schedule her an appointment with either Dr. Angelena Form or an extended care provider. She requests to get advisement from Dr. Angelena Form on his recommendation first. Routed to Dr. Angelena Form.

## 2013-07-10 NOTE — Telephone Encounter (Signed)
Spoke with pt and gave her information from Dr. Angelena Form. She is feeling much better today and does not feel she needs an office visit at this time.

## 2013-07-10 NOTE — Telephone Encounter (Signed)
This does not sound cardiac. I will be glad to see her if she would like. Gerald Stabs

## 2013-09-04 ENCOUNTER — Emergency Department (HOSPITAL_COMMUNITY)
Admission: EM | Admit: 2013-09-04 | Discharge: 2013-09-04 | Disposition: A | Discharge status: Home / Self Care | Payer: 59 | Attending: Emergency Medicine | Admitting: Emergency Medicine

## 2013-09-04 ENCOUNTER — Encounter (HOSPITAL_COMMUNITY): Payer: Self-pay | Admitting: Emergency Medicine

## 2013-09-04 DIAGNOSIS — Z7982 Long term (current) use of aspirin: Secondary | ICD-10-CM | POA: Insufficient documentation

## 2013-09-04 DIAGNOSIS — Z8711 Personal history of peptic ulcer disease: Secondary | ICD-10-CM | POA: Insufficient documentation

## 2013-09-04 DIAGNOSIS — Z8659 Personal history of other mental and behavioral disorders: Secondary | ICD-10-CM | POA: Insufficient documentation

## 2013-09-04 DIAGNOSIS — E7800 Pure hypercholesterolemia, unspecified: Secondary | ICD-10-CM | POA: Insufficient documentation

## 2013-09-04 DIAGNOSIS — Z79899 Other long term (current) drug therapy: Secondary | ICD-10-CM | POA: Insufficient documentation

## 2013-09-04 DIAGNOSIS — I10 Essential (primary) hypertension: Secondary | ICD-10-CM | POA: Insufficient documentation

## 2013-09-04 DIAGNOSIS — L03211 Cellulitis of face: Secondary | ICD-10-CM

## 2013-09-04 DIAGNOSIS — Z792 Long term (current) use of antibiotics: Secondary | ICD-10-CM | POA: Insufficient documentation

## 2013-09-04 DIAGNOSIS — Z880 Allergy status to penicillin: Secondary | ICD-10-CM | POA: Insufficient documentation

## 2013-09-04 DIAGNOSIS — Z8739 Personal history of other diseases of the musculoskeletal system and connective tissue: Secondary | ICD-10-CM | POA: Insufficient documentation

## 2013-09-04 DIAGNOSIS — Z860100 Personal history of colon polyps, unspecified: Secondary | ICD-10-CM | POA: Insufficient documentation

## 2013-09-04 DIAGNOSIS — K219 Gastro-esophageal reflux disease without esophagitis: Secondary | ICD-10-CM | POA: Insufficient documentation

## 2013-09-04 DIAGNOSIS — L0201 Cutaneous abscess of face: Secondary | ICD-10-CM | POA: Insufficient documentation

## 2013-09-04 MED ORDER — DOXYCYCLINE HYCLATE 100 MG PO CAPS: 100.0000 mg | ORAL_CAPSULE | Freq: Two times a day (BID) | ORAL | Status: DC

## 2013-09-04 NOTE — Discharge Instructions (Signed)
Apply warm compresses to affected area throughout the day. Take antibiotic until it is finished. Take tylenol or motrin for pain. Followup with your Primary Care doctor in 2-3 days for wound recheck.  Return to emergency department for emergent changing or worsening symptoms.   Cellulitis Cellulitis is an infection of the skin and the tissue under the skin. The infected area is usually red and tender. This happens most often in the arms and lower legs. HOME CARE   Take your antibiotic medicine as told. Finish the medicine even if you start to feel better.  Keep the infected arm or leg raised (elevated).  Put a warm cloth on the area up to 4 times per day.  Only take medicines as told by your doctor.  Keep all doctor visits as told. GET HELP RIGHT AWAY IF:   You have a fever.  You feel very sleepy.  You throw up (vomit) or have watery poop (diarrhea).  You feel sick and have muscle aches and pains.  You see red streaks on the skin coming from the infected area.  Your red area gets bigger or turns a dark color.  Your bone or joint under the infected area is painful after the skin heals.  Your infection comes back in the same area or different area.  You have a puffy (swollen) bump in the infected area.  You have new symptoms. MAKE SURE YOU:   Understand these instructions.  Will watch your condition.  Will get help right away if you are not doing well or get worse. Document Released: 07/27/2007 Document Revised: 08/09/2011 Document Reviewed: 04/25/2011 [facility] Patient Information 2015 [place], [place]. This information is not intended to replace advice given to you by your health care provider. Make sure you discuss any questions you have with your health care provider.

## 2013-09-04 NOTE — ED Provider Notes (Signed)
CSN: [id number]     Arrival date & time 09/04/13  2130 History  This chart was scribed for [employer], PA-C working with Alfonzo Feller, DO by Randa Evens, ED Scribe. This patient was seen in room TR04C/TR04C and the patient's care was started at 10:54 PM.    Chief Complaint  Patient presents with  . Facial Pain   HPI Comments: [name] is a 73 y.o. female with a PMHx of PVCs and HTN who presents to the Emergency Department complaining of left sided facial pain onset today when she awoke, feels tight, constant, and mild in severity. She states the area was red and warm to touch. She states she had associated facial swelling to the area near her temple. She states she tried cortisone cream with no relief. She states she also applied ice with no relief. She does not recall scratching or having any insect bites. She denies painful EOM, red streaking, visual disturbance, HA, jaw claudication, abd pain, nausea, vomiting, CP, SOB, fevers, chills, weakness, or paresthesias. Takes ASA 81mg  daily for PVCs, denies dx of DM or afib. She states she has a history of TIA in August 2014.  The history is provided by the patient. No language interpreter was used.    Past Medical History  Diagnosis Date  . Arrhythmia     PVC  . Hypercholesteremia   . Osteoporosis   . Anxiety   . Sleep disorder   . PUD (peptic ulcer disease)   . Appendicitis   . Hx of adenomatous colonic polyps   . Diverticulitis   . GERD (gastroesophageal reflux disease)   . IBS (irritable bowel syndrome)   . Fibromyalgia   . Hypertension   . Thyroid disease   . Palpitation   . Back abscess   . PVC (premature ventricular contraction)    Past Surgical History  Procedure Laterality Date  . Appendectomy    . Wrist surgery      left  . Carpal tunnel release      left  . Abdominal hysterectomy     Family History  Problem Relation Age of Onset  . Heart attack Maternal Grandfather 72  . Glaucoma  Mother   . Macular degeneration Mother   . Lung cancer Father   . Lung cancer Brother   . Tuberculosis Paternal Grandfather   . Colon cancer Paternal Grandmother 62   History  Substance Use Topics  . Smoking status: Never Smoker   . Smokeless tobacco: Never Used  . Alcohol Use: No   OB History   Grav Para Term Preterm Abortions TAB SAB Ect Mult Living                 Review of Systems  Constitutional: Negative for fever and chills.  HENT: Positive for facial swelling. Negative for drooling and ear pain.   Eyes: Negative for photophobia, pain, discharge, redness, itching and visual disturbance.  Respiratory: Negative for chest tightness and shortness of breath.   Cardiovascular: Negative for chest pain.  Gastrointestinal: Negative for nausea, vomiting and abdominal pain.  Musculoskeletal: Negative for arthralgias, neck pain and neck stiffness.  Skin: Positive for rash.  Neurological: Negative for dizziness, syncope, weakness, numbness and headaches.  Psychiatric/Behavioral: Negative for confusion.  10 Systems reviewed and are negative for acute change except as noted in the HPI.   Allergies  Penicillins; Sulfonamide derivatives; Iodinated diagnostic agents; Metronidazole; and Prednisone  Home Medications   Prior to Admission medications   Medication Sig  Start Date End Date Taking? Authorizing Provider  aspirin EC 81 MG tablet Take 1 tablet (81 mg total) by mouth daily. 12/18/12  Yes Burnell Blanks, MD  desoximetasone (TOPICORT) 0.25 % cream Apply 1 application topically daily as needed. To rash on shins and feet   Yes Historical Provider, MD  dicyclomine (BENTYL) 10 MG capsule Take 10 mg by mouth daily as needed. Take 1 tablet by mouth every 4-6 hours as needed for colon spasm 07/26/11  Yes Lafayette Dragon, MD  ergocalciferol (VITAMIN D2) 50000 UNITS capsule Take 50,000 Units by mouth once a week. On Saturday, The last week of each month takes an additional dose on the  last Monday of the month   Yes Historical Provider, MD  esomeprazole (NEXIUM) 20 MG capsule Take 1 capsule (20 mg total) by mouth daily. 07/26/11  Yes Lafayette Dragon, MD  meclizine (ANTIVERT) 25 MG tablet Take 25 mg by mouth daily as needed for dizziness. 04/25/13  Yes Burnell Blanks, MD  metoprolol tartrate (LOPRESSOR) 25 MG tablet Take 1 tablet (25 mg total) by mouth 2 (two) times daily. 04/25/13  Yes Burnell Blanks, MD  Probiotic Product (ALIGN) 4 MG CAPS Take 1 capsule by mouth daily as needed (for stomach upset and diarrhea).   Yes Historical Provider, MD  simvastatin (ZOCOR) 40 MG tablet Take 40 mg by mouth at bedtime.     Yes Historical Provider, MD  doxycycline (VIBRAMYCIN) 100 MG capsule Take 1 capsule (100 mg total) by mouth 2 (two) times daily. One po bid x 7 days 09/04/13   Patty Sermons Camprubi-Soms, PA-C   Triage Vitals: BP 140/78  Pulse 93  Temp(Src) 98.6 F (37 C) (Oral)  Resp 16  SpO2 97%  Physical Exam  Nursing note and vitals reviewed. Constitutional: She is oriented to person, place, and time. Vital signs are normal. She appears well-developed and well-nourished. No distress.  Afebrile, nontoxic, NAD  HENT:  Head: Normocephalic and atraumatic.    Nose: Nose normal.  Mouth/Throat: Mucous membranes are normal.  [place]/AT, with erythema and mild swelling located over L sided lateral aspect of face, next to eye, near temple but not extending into hairline. Mildly TTP near hairline, but no pulsations or induration noted. No TMJ TTP  Eyes: Conjunctivae, EOM and lids are normal. Pupils are equal, round, and reactive to light.  EOMI, PERRL, conjunctiva non injected, lids non edematous with no erythema and no discharge.  Neck: Normal range of motion. Neck supple. No tracheal deviation present.  Cardiovascular: Normal rate.   Pulmonary/Chest: Effort normal. No respiratory distress.  Abdominal: Normal appearance. She exhibits no distension.  Musculoskeletal: Normal  range of motion.  Neurological: She is alert and oriented to person, place, and time.  Skin: Skin is warm and dry. There is erythema.  Area approx 5cm in diameter of erythema and mild swelling over lateral L face, next to L eye, near temple, extending to hairline. Minimally TTP at hairline. No induration or fluctuance. No pulsations.  Psychiatric: She has a normal mood and affect. Her behavior is normal.    ED Course  Procedures (including critical care time) DIAGNOSTIC STUDIES: Oxygen Saturation is 97% on RA, normal by my interpretation.    COORDINATION OF CARE: 11:06 PM-Discussed treatment plan which includes pain medication and antibiotics with pt at bedside and pt agreed to plan.     Labs Review Labs Reviewed - No data to display  Imaging Review No results found.   EKG Interpretation  None      MDM   Final diagnoses:  Cellulitis of face    [name] is a 73 y.o. female with a PMHx of PVCs and HTN presenting with sudden onset L sided facial swelling and erythema. Appears to be early cellulitis with no abscess. Doubt clot given lack of neuro symptoms and no palpable cord, minimally TTP. Doubt GCA given lack of symptoms as well as minimal tenderness and no pulsations. Will give doxy and have pt f/up with PCP in 2-3 days. Will take tylenol and motrin for pain. Afebrile and nontoxic appearing, doubt periorbital/orbital cellulitis due to EOMI without pain. I explained the diagnosis and have given explicit precautions to return to the ER including for any other new or worsening symptoms. The patient understands and accepts the medical plan as it's been dictated and I have answered their questions. Discharge instructions concerning home care and prescriptions have been given. The patient is STABLE and is discharged to home in good condition.    I personally performed the services described in this documentation, which was scribed in my presence. The recorded information has  been reviewed and is accurate.  BP 120/69  Pulse 77  Temp(Src) 98.6 F (37 C) (Oral)  Resp 16  SpO2 96%     [employer], PA-C 09/04/13 2327

## 2013-09-04 NOTE — ED Notes (Addendum)
C/o tenderness at R temporal area since this morning.  States later in the morning area became swollen and red.  Using ice packs on area today.

## 2013-09-05 NOTE — ED Provider Notes (Signed)
Medical screening examination/treatment/procedure(s) were performed by non-physician practitioner and as supervising physician I was immediately available for consultation/collaboration.   EKG Interpretation None        Alfonzo Feller, DO 09/05/13 1433

## 2013-09-21 ENCOUNTER — Emergency Department (HOSPITAL_COMMUNITY): Payer: Medicare Other

## 2013-09-21 ENCOUNTER — Emergency Department (HOSPITAL_COMMUNITY)
Admission: EM | Admit: 2013-09-21 | Discharge: 2013-09-22 | Disposition: A | Discharge status: Home / Self Care | Payer: Medicare Other | Attending: Emergency Medicine | Admitting: Emergency Medicine

## 2013-09-21 ENCOUNTER — Encounter (HOSPITAL_COMMUNITY): Payer: Self-pay | Admitting: Emergency Medicine

## 2013-09-21 ENCOUNTER — Telehealth: Payer: Self-pay | Admitting: Nurse Practitioner

## 2013-09-21 DIAGNOSIS — IMO0002 Reserved for concepts with insufficient information to code with codable children: Secondary | ICD-10-CM | POA: Diagnosis not present

## 2013-09-21 DIAGNOSIS — Z7982 Long term (current) use of aspirin: Secondary | ICD-10-CM | POA: Insufficient documentation

## 2013-09-21 DIAGNOSIS — Z79899 Other long term (current) drug therapy: Secondary | ICD-10-CM | POA: Insufficient documentation

## 2013-09-21 DIAGNOSIS — Z8669 Personal history of other diseases of the nervous system and sense organs: Secondary | ICD-10-CM | POA: Diagnosis not present

## 2013-09-21 DIAGNOSIS — R5381 Other malaise: Secondary | ICD-10-CM | POA: Diagnosis not present

## 2013-09-21 DIAGNOSIS — Z880 Allergy status to penicillin: Secondary | ICD-10-CM | POA: Insufficient documentation

## 2013-09-21 DIAGNOSIS — K589 Irritable bowel syndrome without diarrhea: Secondary | ICD-10-CM | POA: Diagnosis not present

## 2013-09-21 DIAGNOSIS — E7800 Pure hypercholesterolemia, unspecified: Secondary | ICD-10-CM | POA: Insufficient documentation

## 2013-09-21 DIAGNOSIS — K219 Gastro-esophageal reflux disease without esophagitis: Secondary | ICD-10-CM | POA: Insufficient documentation

## 2013-09-21 DIAGNOSIS — I10 Essential (primary) hypertension: Secondary | ICD-10-CM | POA: Diagnosis not present

## 2013-09-21 DIAGNOSIS — Z791 Long term (current) use of non-steroidal anti-inflammatories (NSAID): Secondary | ICD-10-CM | POA: Insufficient documentation

## 2013-09-21 DIAGNOSIS — M810 Age-related osteoporosis without current pathological fracture: Secondary | ICD-10-CM | POA: Insufficient documentation

## 2013-09-21 DIAGNOSIS — Z8739 Personal history of other diseases of the musculoskeletal system and connective tissue: Secondary | ICD-10-CM | POA: Insufficient documentation

## 2013-09-21 DIAGNOSIS — R42 Dizziness and giddiness: Secondary | ICD-10-CM | POA: Diagnosis present

## 2013-09-21 DIAGNOSIS — R002 Palpitations: Secondary | ICD-10-CM | POA: Diagnosis not present

## 2013-09-21 DIAGNOSIS — R55 Syncope and collapse: Secondary | ICD-10-CM | POA: Insufficient documentation

## 2013-09-21 DIAGNOSIS — Z860100 Personal history of colon polyps, unspecified: Secondary | ICD-10-CM | POA: Insufficient documentation

## 2013-09-21 DIAGNOSIS — R5383 Other fatigue: Secondary | ICD-10-CM

## 2013-09-21 LAB — CBC WITH DIFFERENTIAL/PLATELET
BASOS PCT: 0 % (ref 0–1)
Basophils Absolute: 0 10*3/uL (ref 0.0–0.1)
Eosinophils Absolute: 0.1 10*3/uL (ref 0.0–0.7)
Eosinophils Relative: 1 % (ref 0–5)
HCT: 43.5 % (ref 36.0–46.0)
Hemoglobin: 14.5 g/dL (ref 12.0–15.0)
Lymphocytes Relative: 31 % (ref 12–46)
Lymphs Abs: 2.5 10*3/uL (ref 0.7–4.0)
MCH: 30.3 pg (ref 26.0–34.0)
MCHC: 33.3 g/dL (ref 30.0–36.0)
MCV: 91 fL (ref 78.0–100.0)
MONOS PCT: 9 % (ref 3–12)
Monocytes Absolute: 0.7 10*3/uL (ref 0.1–1.0)
NEUTROS PCT: 59 % (ref 43–77)
Neutro Abs: 4.7 10*3/uL (ref 1.7–7.7)
PLATELETS: 243 10*3/uL (ref 150–400)
RBC: 4.78 MIL/uL (ref 3.87–5.11)
RDW: 12.7 % (ref 11.5–15.5)
WBC: 8 10*3/uL (ref 4.0–10.5)

## 2013-09-21 LAB — COMPREHENSIVE METABOLIC PANEL
ALK PHOS: 59 U/L (ref 39–117)
ALT: 22 U/L (ref 0–35)
AST: 21 U/L (ref 0–37)
Albumin: 4 g/dL (ref 3.5–5.2)
Anion gap: 15 (ref 5–15)
BUN: 14 mg/dL (ref 6–23)
CO2: 25 mEq/L (ref 19–32)
Calcium: 9.1 mg/dL (ref 8.4–10.5)
Chloride: 101 mEq/L (ref 96–112)
Creatinine, Ser: 0.83 mg/dL (ref 0.50–1.10)
GFR calc Af Amer: 79 mL/min — ABNORMAL LOW (ref >90)
GFR calc non Af Amer: 68 mL/min — ABNORMAL LOW (ref >90)
Glucose, Bld: 98 mg/dL (ref 70–99)
POTASSIUM: 4.6 meq/L (ref 3.7–5.3)
SODIUM: 141 meq/L (ref 137–147)
Total Bilirubin: 0.4 mg/dL (ref 0.3–1.2)
Total Protein: 7.1 g/dL (ref 6.0–8.3)

## 2013-09-21 LAB — I-STAT TROPONIN, ED: TROPONIN I, POC: 0.01 ng/mL (ref 0.00–0.08)

## 2013-09-21 NOTE — ED Notes (Signed)
Pt presents to department for evaluation of dizziness, near syncope and PVC's on EKG, was sent to ED from Urgent Care. Pt states diaphoresis, dizziness, and near syncopal episode last week, also reports feeling of heart "racing." denies pain at the time. Pt is alert and oriented x4.

## 2013-09-21 NOTE — Telephone Encounter (Signed)
Pt called this afternoon to report that on Mon and Tues of this week, she had a few episodes of suddenly feeling hot and diaphoretic assoc with lightheadedness.  Ss resolve after a few mins.  HR's have been somewhat erratic this week as well with HR's in the 40's to low 100's.  She has been feeling lightheaded today again and is concerned about her heart rate and rhythm.  She does have a h/o PVC's and these have been managed with beta blocker therapy.  She plans to present to local urgent care for ecg and labs.  I agreed that that sounded appropriate.  Ss earlier this week sound vasovagal.  She verbalized understanding of instructions and will go to UC today.

## 2013-09-21 NOTE — ED Notes (Signed)
Pt states she has been feeling very tired since Monday. Pt states she was outside on Monday got really hot and dizzy and was perspiring. Pt states she went inside and cooled off, drank some water and began feeling better. On Tuesday she was out shopping and suddenly had dizziness, got hot, and began sweating again. Pt states her fitbit told her her heart rate was around 101. Pt states since then she has been extremely tired and has been sitting around the house due to being really tired. Pt also reports when she turns her head she gets dizzy. Pt is being seen at a hearing clinic for her dizziness to see if it is related to her inner ear. Pt goes to a heart doctor for her pvcs and hypertension. Pt also reports a funny feeling in her sternal area. Pt denies any sob, or cp.

## 2013-09-22 LAB — URINALYSIS, ROUTINE W REFLEX MICROSCOPIC
Bilirubin Urine: NEGATIVE
GLUCOSE, UA: NEGATIVE mg/dL
Hgb urine dipstick: NEGATIVE
Ketones, ur: NEGATIVE mg/dL
NITRITE: NEGATIVE
Protein, ur: NEGATIVE mg/dL
Specific Gravity, Urine: 1.011 (ref 1.005–1.030)
UROBILINOGEN UA: 0.2 mg/dL (ref 0.0–1.0)
pH: 5.5 (ref 5.0–8.0)

## 2013-09-22 LAB — URINE MICROSCOPIC-ADD ON

## 2013-09-22 NOTE — Discharge Instructions (Signed)
Dizziness °Dizziness is a common problem. It is a feeling of unsteadiness or light-headedness. You may feel like you are about to faint. Dizziness can lead to injury if you stumble or fall. A person of any age group can suffer from dizziness, but dizziness is more common in older adults. °CAUSES  °Dizziness can be caused by many different things, including: °· Middle ear problems. °· Standing for too long. °· Infections. °· An allergic reaction. °· Aging. °· An emotional response to something, such as the sight of blood. °· Side effects of medicines. °· Tiredness. °· Problems with circulation or blood pressure. °· Excessive use of alcohol or medicines, or illegal drug use. °· Breathing too fast (hyperventilation). °· An irregular heart rhythm (arrhythmia). °· A low red blood cell count (anemia). °· Pregnancy. °· Vomiting, diarrhea, fever, or other illnesses that cause body fluid loss (dehydration). °· Diseases or conditions such as Parkinson's disease, high blood pressure (hypertension), diabetes, and thyroid problems. °· Exposure to extreme heat. °DIAGNOSIS  °Your health care provider will ask about your symptoms, perform a physical exam, and perform an electrocardiogram (ECG) to record the electrical activity of your heart. Your health care provider may also perform other heart or blood tests to determine the cause of your dizziness. These may include: °· Transthoracic echocardiogram (TTE). During echocardiography, sound waves are used to evaluate how blood flows through your heart. °· Transesophageal echocardiogram (TEE). °· Cardiac monitoring. This allows your health care provider to monitor your heart rate and rhythm in real time. °· Holter monitor. This is a portable device that records your heartbeat and can help diagnose heart arrhythmias. It allows your health care provider to track your heart activity for several days if needed. °· Stress tests by exercise or by giving medicine that makes the heart beat  faster. °TREATMENT  °Treatment of dizziness depends on the cause of your symptoms and can vary greatly. °HOME CARE INSTRUCTIONS  °· Drink enough fluids to keep your urine clear or pale yellow. This is especially important in very hot weather. In older adults, it is also important in cold weather. °· Take your medicine exactly as directed if your dizziness is caused by medicines. When taking blood pressure medicines, it is especially important to get up slowly. °¨ Rise slowly from chairs and steady yourself until you feel okay. °¨ In the morning, first sit up on the side of the bed. When you feel okay, stand slowly while holding onto something until you know your balance is fine. °· Move your legs often if you need to stand in one place for a long time. Tighten and relax your muscles in your legs while standing. °· Have someone stay with you for 1-2 days if dizziness continues to be a problem. Do this until you feel you are well enough to stay alone. Have the person call your health care provider if he or she notices changes in you that are concerning. °· Do not drive or use heavy machinery if you feel dizzy. °· Do not drink alcohol. °SEEK IMMEDIATE MEDICAL CARE IF:  °· Your dizziness or light-headedness gets worse. °· You feel nauseous or vomit. °· You have problems talking, walking, or using your arms, hands, or legs. °· You feel weak. °· You are not thinking clearly or you have trouble forming sentences. It may take a friend or family member to notice this. °· You have chest pain, abdominal pain, shortness of breath, or sweating. °· Your vision changes. °· You notice   any bleeding. °· You have side effects from medicine that seems to be getting worse rather than better. °MAKE SURE YOU:  °· Understand these instructions. °· Will watch your condition. °· Will get help right away if you are not doing well or get worse. °Document Released: 08/03/2000 Document Revised: 02/12/2013 Document Reviewed: 08/27/2010 °ExitCare®  Patient Information ©2015 [employer]. This information is not intended to replace advice given to you by your health care provider. Make sure you discuss any questions you have with your health care provider. °Fatigue °Fatigue is a feeling of tiredness, lack of energy, lack of motivation, or feeling tired all the time. Having enough rest, good nutrition, and reducing stress will normally reduce fatigue. Consult your caregiver if it persists. The nature of your fatigue will help your caregiver to find out its cause. The treatment is based on the cause.  °CAUSES  °There are many causes for fatigue. Most of the time, fatigue can be traced to one or more of your habits or routines. Most causes fit into one or more of three general areas. They are: °Lifestyle problems °· Sleep disturbances. °· Overwork. °· Physical exertion. °· Unhealthy habits. °¨ Poor eating habits or eating disorders. °¨ Alcohol and/or drug use . °¨ Lack of proper nutrition (malnutrition). °Psychological problems °· Stress and/or anxiety problems. °· Depression. °· Grief. °· Boredom. °Medical Problems or Conditions °· Anemia. °· Pregnancy. °· Thyroid gland problems. °· Recovery from major surgery. °· Continuous pain. °· Emphysema or asthma that is not well controlled °· Allergic conditions. °· Diabetes. °· Infections (such as mononucleosis). °· Obesity. °· Sleep disorders, such as sleep apnea. °· Heart failure or other heart-related problems. °· Cancer. °· Kidney disease. °· Liver disease. °· Effects of certain medicines such as antihistamines, cough and cold remedies, prescription pain medicines, heart and blood pressure medicines, drugs used for treatment of cancer, and some antidepressants. °SYMPTOMS  °The symptoms of fatigue include:  °· Lack of energy. °· Lack of drive (motivation). °· Drowsiness. °· Feeling of indifference to the surroundings. °DIAGNOSIS  °The details of how you feel help guide your caregiver in finding out what is causing  the fatigue. You will be asked about your present and past health condition. It is important to review all medicines that you take, including prescription and non-prescription items. A thorough exam will be done. You will be questioned about your feelings, habits, and normal lifestyle. Your caregiver may suggest blood tests, urine tests, or other tests to look for common medical causes of fatigue.  °TREATMENT  °Fatigue is treated by correcting the underlying cause. For example, if you have continuous pain or depression, treating these causes will improve how you feel. Similarly, adjusting the dose of certain medicines will help in reducing fatigue.  °HOME CARE INSTRUCTIONS  °· Try to get the required amount of good sleep every night. °· Eat a healthy and nutritious diet, and drink enough water throughout the day. °· Practice ways of relaxing (including yoga or meditation). °· Exercise regularly. °· Make plans to change situations that cause stress. Act on those plans so that stresses decrease over time. Keep your work and personal routine reasonable. °· Avoid street drugs and minimize use of alcohol. °· Start taking a daily multivitamin after consulting your caregiver. °SEEK MEDICAL CARE IF:  °· You have persistent tiredness, which cannot be accounted for. °· You have fever. °· You have unintentional weight loss. °· You have headaches. °· You have disturbed sleep throughout the night. °·   You are feeling sad. °· You have constipation. °· You have dry skin. °· You have gained weight. °· You are taking any new or different medicines that you suspect are causing fatigue. °· You are unable to sleep at night. °· You develop any unusual swelling of your legs or other parts of your body. °SEEK IMMEDIATE MEDICAL CARE IF:  °· You are feeling confused. °· Your vision is blurred. °· You feel faint or pass out. °· You develop severe headache. °· You develop severe abdominal, pelvic, or back pain. °· You develop chest pain,  shortness of breath, or an irregular or fast heartbeat. °· You are unable to pass a normal amount of urine. °· You develop abnormal bleeding such as bleeding from the rectum or you vomit blood. °· You have thoughts about harming yourself or committing suicide. °· You are worried that you might harm someone else. °MAKE SURE YOU:  °· Understand these instructions. °· Will watch your condition. °· Will get help right away if you are not doing well or get worse. °Document Released: 12/05/2006 Document Revised: 05/02/2011 Document Reviewed: 06/11/2013 °ExitCare® Patient Information ©2015 [employer]. This information is not intended to replace advice given to you by your health care provider. Make sure you discuss any questions you have with your health care provider. ° °

## 2013-09-22 NOTE — ED Provider Notes (Signed)
CSN: [id number]     Arrival date & time 09/21/13  1852 History   First MD Initiated Contact with Patient 09/21/13 2041     Chief Complaint  Patient presents with  . Dizziness  . Near Syncope     (Consider location/radiation/quality/duration/timing/severity/associated sxs/prior Treatment) HPI  [name] is a 73 y.o. female who has had two episodes of near-syncope, and periods of palpitations today. No Chest Pain, N/V, SOB, weakness or dizziness. She is taking her usual medications without relief. She has had this happen in the past. There are no other known modifying factors.   Past Medical History  Diagnosis Date  . Arrhythmia     PVC  . Hypercholesteremia   . Osteoporosis   . Anxiety   . Sleep disorder   . PUD (peptic ulcer disease)   . Appendicitis   . Hx of adenomatous colonic polyps   . Diverticulitis   . GERD (gastroesophageal reflux disease)   . IBS (irritable bowel syndrome)   . Fibromyalgia   . Hypertension   . Thyroid disease   . Palpitation   . Back abscess   . PVC (premature ventricular contraction)    Past Surgical History  Procedure Laterality Date  . Appendectomy    . Wrist surgery      left  . Carpal tunnel release      left  . Abdominal hysterectomy     Family History  Problem Relation Age of Onset  . Heart attack Maternal Grandfather 72  . Glaucoma Mother   . Macular degeneration Mother   . Lung cancer Father   . Lung cancer Brother   . Tuberculosis Paternal Grandfather   . Colon cancer Paternal Grandmother 67   History  Substance Use Topics  . Smoking status: Never Smoker   . Smokeless tobacco: Never Used  . Alcohol Use: No   OB History   Grav Para Term Preterm Abortions TAB SAB Ect Mult Living                 Review of Systems  All other systems reviewed and are negative.     Allergies  Penicillins; Sulfonamide derivatives; Iodinated diagnostic agents; Metronidazole; and Prednisone  Home Medications   Prior to  Admission medications   Medication Sig Start Date End Date Taking? Authorizing Provider  acetaminophen (TYLENOL) 500 MG tablet Take 500 mg by mouth 2 (two) times daily as needed (pain).   Yes Historical Provider, MD  aspirin EC 81 MG tablet Take 1 tablet (81 mg total) by mouth daily. 12/18/12  Yes Burnell Blanks, MD  desoximetasone (TOPICORT) 0.25 % cream Apply 1 application topically 2 (two) times daily as needed (lichen planus on legs).    Yes Historical Provider, MD  dicyclomine (BENTYL) 10 MG capsule Take 10 mg by mouth daily as needed for spasms (colon spasms).  07/26/11  Yes Lafayette Dragon, MD  ergocalciferol (VITAMIN D2) 50000 UNITS capsule Take 50,000 Units by mouth See admin instructions. Take 1 capsule (50,000 units) every Saturday and an additional capsule on the last Monday of each month   Yes Historical Provider, MD  esomeprazole (NEXIUM) 20 MG capsule Take 20 mg by mouth at bedtime.   Yes Historical Provider, MD  ibandronate (BONIVA) 150 MG tablet Take 150 mg by mouth every 30 (thirty) days. On the 1st of each month 08/16/13  Yes Historical Provider, MD  ibuprofen (ADVIL,MOTRIN) 200 MG tablet Take 200 mg by mouth daily as needed (pain).  Yes Historical Provider, MD  meclizine (ANTIVERT) 25 MG tablet Take 25 mg by mouth daily.  04/25/13  Yes Burnell Blanks, MD  metoprolol tartrate (LOPRESSOR) 25 MG tablet Take 1 tablet (25 mg total) by mouth 2 (two) times daily. 04/25/13  Yes Burnell Blanks, MD  simvastatin (ZOCOR) 40 MG tablet Take 40 mg by mouth at bedtime.     Yes Historical Provider, MD   BP 114/73  Pulse 71  Temp(Src) 98.1 F (36.7 C) (Oral)  Resp 17  SpO2 95% Physical Exam  Nursing note and vitals reviewed. Constitutional: She is oriented to person, place, and time. She appears well-developed and well-nourished.  HENT:  Head: Normocephalic and atraumatic.  Eyes: Conjunctivae and EOM are normal. Pupils are equal, round, and reactive to light.  Neck: Normal  range of motion and phonation normal. Neck supple.  Cardiovascular: Normal rate, regular rhythm and intact distal pulses.   Pulmonary/Chest: Effort normal and breath sounds normal. She exhibits no tenderness.  Abdominal: Soft. She exhibits no distension. There is no tenderness. There is no guarding.  Musculoskeletal: Normal range of motion. She exhibits edema and tenderness.  Neurological: She is alert and oriented to person, place, and time. She exhibits normal muscle tone.  Skin: Skin is warm and dry.  Psychiatric: She has a normal mood and affect. Her behavior is normal. Judgment and thought content normal.    ED Course  Procedures (including critical care time)  Medications - No data to display  Patient Vitals for the past 24 hrs:  BP Temp Temp src Pulse Resp SpO2  09/22/13 0030 114/73 mmHg - - 71 17 95 %  09/22/13 0015 131/58 mmHg - - 69 18 94 %  09/22/13 0004 126/45 mmHg - - 74 20 97 %  09/21/13 2300 122/63 mmHg - - 64 19 96 %  09/21/13 2230 116/64 mmHg - - 70 19 96 %  09/21/13 2115 127/61 mmHg - - 68 20 97 %  09/21/13 2113 127/61 mmHg 98.1 F (36.7 C) Oral 69 14 95 %  09/21/13 2100 113/65 mmHg - - 70 23 96 %  09/21/13 1903 140/95 mmHg 98.9 F (37.2 C) Oral 91 18 97 %    At D/C Reevaluation with update and discussion. After initial assessment and treatment, an updated evaluation reveals, she is comfortable. No further c/o. Findings discussed with patient, and all questions answered. [facility] Review Labs Reviewed  COMPREHENSIVE METABOLIC PANEL - Abnormal; Notable for the following:    GFR calc non Af Amer 68 (*)    GFR calc Af Amer 79 (*)    All other components within normal limits  URINALYSIS, ROUTINE W REFLEX MICROSCOPIC - Abnormal; Notable for the following:    Leukocytes, UA TRACE (*)    All other components within normal limits  URINE CULTURE  CBC WITH DIFFERENTIAL  URINE MICROSCOPIC-ADD ON  Randolm Idol, ED    Imaging Review Dg Chest 2  View  09/21/2013   CLINICAL DATA:  Dizziness.  Near syncope.  EXAM: CHEST  2 VIEW  COMPARISON:  05/15/2009.  Abdomen CT dated 07/05/2013.  FINDINGS: The heart remains normal in size and the lungs are clear with normal vascularity. There is a small irregular density along the inferior left heart border. There was some irregularity in this region previously, shown to be due to a mildly irregular epicardial fat pad on the previous CT. Mild thoracic spine degenerative changes.  IMPRESSION: No acute abnormality.   Electronically Signed  By: Enrique Sack M.D.   On: 09/21/2013 20:20     EKG Interpretation   Date/Time:  Saturday September 21 2013 18:56:51 EDT Ventricular Rate:  92 PR Interval:  196 QRS Duration: 82 QT Interval:  386 QTC Calculation: 477 R Axis:   -29 Text Interpretation:  Sinus rhythm with frequent Premature ventricular  complexes Otherwise normal ECG since last tracing no significant change  Confirmed by Eulis Foster  MD, Vira Agar ([id number]) on 09/21/2013 8:42:15 PM      MDM   Final diagnoses:  Other fatigue  Dizziness  Palpitations    Near-syncope withour evidence for metabolic instability, SBI, ACS or PNE.   Nursing Notes Reviewed/ Care Coordinated Applicable Imaging Reviewed Interpretation of Laboratory Data incorporated into ED treatment  The patient appears reasonably screened and/or stabilized for discharge and I doubt any other medical condition or other [facility] requiring further screening, evaluation, or treatment in the ED at this time prior to discharge.  Plan: Home Medications- Usual; Home Treatments- rest; return here if the recommended treatment, does not improve the symptoms; Recommended follow up- PCP or Cardiology f/u in 1 week.    Richarda Blade, MD 09/22/13 [phone]

## 2013-09-22 NOTE — ED Notes (Signed)
Discharge and follow up instructions reviewed with pt. Pt verbalized understanding.  

## 2013-09-23 LAB — URINE CULTURE: Colony Count: 5000

## 2013-09-24 ENCOUNTER — Encounter: Payer: Self-pay | Admitting: Physician Assistant

## 2013-09-24 ENCOUNTER — Ambulatory Visit (INDEPENDENT_AMBULATORY_CARE_PROVIDER_SITE_OTHER): Payer: Medicare Other | Admitting: Physician Assistant

## 2013-09-24 VITALS — BP 115/80 | HR 70 | Ht >= 80 in | Wt 162.0 lb

## 2013-09-24 DIAGNOSIS — R6889 Other general symptoms and signs: Secondary | ICD-10-CM

## 2013-09-24 DIAGNOSIS — I493 Ventricular premature depolarization: Secondary | ICD-10-CM

## 2013-09-24 DIAGNOSIS — R42 Dizziness and giddiness: Secondary | ICD-10-CM

## 2013-09-24 DIAGNOSIS — I739 Peripheral vascular disease, unspecified: Secondary | ICD-10-CM

## 2013-09-24 DIAGNOSIS — I10 Essential (primary) hypertension: Secondary | ICD-10-CM

## 2013-09-24 DIAGNOSIS — E785 Hyperlipidemia, unspecified: Secondary | ICD-10-CM

## 2013-09-24 DIAGNOSIS — I4949 Other premature depolarization: Secondary | ICD-10-CM

## 2013-09-24 NOTE — Patient Instructions (Signed)
Your physician recommends that you continue on your current medications as directed. Please refer to the Current Medication list given to you today.   Your physician has requested that you have en exercise stress myoview. For further information please visit [email or web address]. Please follow instruction sheet, as given.   Your physician has recommended that you wear an event monitor. Event monitors are medical devices that record the heart's electrical activity. Doctors most often [place] these monitors to diagnose arrhythmias. Arrhythmias are problems with the speed or rhythm of the heartbeat. The monitor is a small, portable device. You can wear one while you do your normal daily activities. This is usually used to diagnose what is causing palpitations/syncope (passing out).  Your physician recommends that you schedule a follow-up appointment in: [name]

## 2013-09-24 NOTE — Progress Notes (Signed)
Cardiology Office Note    Date:  09/24/2013   ID:  [name], DOB Feb 18, 1941, MRN [id number]  PCP:  Kaitlyn Ly, MD  Cardiologist:  Dr. Lauree Chandler      History of Present Illness: [name] is a 73 y.o. female with a hx of carotid stenosis, HL, HTN, low normal LVF (EF 45-50% in 2011 >>> 55-65% in 2012), multiple GI issues including diverticulosis, PUD, GERD, IBS and fibromyalgia.  She also has a hx of PVCs with monitor showing some trigeminy and bigeminy.  She has been tx with beta blocker Rx.  Last seen by Dr. Lauree Chandler 11/2012.  She was seen in the emergency room 09/22/13 with dizziness. ECG in the emergency room demonstrated sinus rhythm with frequent PVCs and no significant ST changes.  She returns for follow up.  Last week, she was outside picking up sticks in her yard in the extreme heat and suddenly got flushed, diaphoretic and near syncopal.  She hydrated and cooled off and felt better.  She had recurrent symptoms while shopping the next day (building had AC that worked).  She called her PCP.  There was concern for heat injury.  She has noted decreased exercise tolerance and fatigue for several weeks.  She was feeling lightheaded and noted her HR in the 40s on her FitBit prior to going to the ED.  She denies chest pain, dyspnea, jaw or arm pain, syncope.  She denies orthopnea, PND, edema.  She has been seeing an ENT for dizziness.  She takes Meclizine daily.  Vestibular eval is pending.     Studies:  - Echo (10/12):  Mild LVH, EF 55-65%, normal wall motion, grade 1 diastolic dysfunction, mild AI, MVP with mild MR, PASP 31 mm Hg  - Carotid US (1/15):  R 1-39%; L 40-59% - follow up 1 year   Recent Labs/Images: 09/21/2013: ALT 22; Creatinine 0.83; Hemoglobin 14.5; Potassium 4.6   Dg Chest 2 View  09/21/2013     IMPRESSION: No acute abnormality.   Electronically Signed   By: Enrique Sack M.D.   On: 09/21/2013 20:20     Wt Readings from Last 3  Encounters:  09/24/13 162 lb (73.483 kg)  12/18/12 158 lb (71.668 kg)  09/19/12 170 lb (77.111 kg)     Past Medical History  Diagnosis Date  . Arrhythmia     PVC  . Hypercholesteremia   . Osteoporosis   . Anxiety   . Sleep disorder   . PUD (peptic ulcer disease)   . Appendicitis   . Hx of adenomatous colonic polyps   . Diverticulitis   . GERD (gastroesophageal reflux disease)   . IBS (irritable bowel syndrome)   . Fibromyalgia   . Hypertension   . Thyroid disease   . Palpitation   . Back abscess   . PVC (premature ventricular contraction)     Current Outpatient Prescriptions  Medication Sig Dispense Refill  . acetaminophen (TYLENOL) 500 MG tablet Take 500 mg by mouth 2 (two) times daily as needed (pain).      Marland Kitchen aspirin EC 81 MG tablet Take 1 tablet (81 mg total) by mouth daily.  30 tablet  3  . desoximetasone (TOPICORT) 0.25 % cream Apply 1 application topically 2 (two) times daily as needed (lichen planus on legs).       Marland Kitchen dicyclomine (BENTYL) 10 MG capsule Take 10 mg by mouth daily as needed for spasms (colon spasms).       Marland Kitchen  ergocalciferol (VITAMIN D2) 50000 UNITS capsule Take 50,000 Units by mouth See admin instructions. Take 1 capsule (50,000 units) every Saturday and an additional capsule on the last Monday of each month      . esomeprazole (NEXIUM) 20 MG capsule Take 20 mg by mouth at bedtime.      . ibandronate (BONIVA) 150 MG tablet Take 150 mg by mouth every 30 (thirty) days. On the 1st of each month      . ibuprofen (ADVIL,MOTRIN) 200 MG tablet Take 200 mg by mouth daily as needed (pain).      . meclizine (ANTIVERT) 25 MG tablet Take 25 mg by mouth daily.       . metoprolol tartrate (LOPRESSOR) 25 MG tablet Take 1 tablet (25 mg total) by mouth 2 (two) times daily.  180 tablet  3  . simvastatin (ZOCOR) 40 MG tablet Take 40 mg by mouth at bedtime.         No current facility-administered medications for this visit.     Allergies:   Penicillins; Sulfonamide  derivatives; Iodinated diagnostic agents; Metronidazole; and Prednisone   Social History:  The patient  reports that she has never smoked. She has never used smokeless tobacco. She reports that she does not drink alcohol or use illicit drugs.   Family History:  The patient's family history includes Cancer in her brother, father, paternal grandmother, and another family member; Colon cancer (age of onset: 3) in her paternal grandmother; Diabetes in her brother; Glaucoma in her mother; Heart attack in her father; Heart attack (age of onset: 34) in her maternal grandfather; Hypertension in her brother and mother; Lung cancer in her brother and father; Macular degeneration in her mother; Stroke in her mother; Tuberculosis in her paternal grandfather.   ROS:  Please see the history of present illness.      All other systems reviewed and negative.   PHYSICAL EXAM: VS:  BP 115/80  Pulse 70  Ht 8' (2.438 m)  Wt 162 lb (73.483 kg)  BMI 12.36 kg/m2  PF 53 L/min Well nourished, well developed, in no acute distress HEENT: normal Neck: no JVD Cardiac:  normal S1, S2; RRR; no murmur Lungs:  clear to auscultation bilaterally, no wheezing, rhonchi or rales Abd: soft, nontender, no hepatomegaly Ext: no edema Skin: warm and dry Neuro:  CNs 2-12 intact, no focal abnormalities noted  EKG:  NSR, HR 70, inf Q waves, low voltage, 1st degree AVB, no significant change when compared to prior tracing.   ASSESSMENT AND PLAN:  Decreased exercise tolerance:  The patient's symptoms of decreased exercise tolerance are most concerning for her. She does have a significant family history of CAD (Dad with MI in his 6s). She also tells me that she has been recently diagnosed with prediabetes. She has a history of vascular disease with known carotid stenosis. Other risk factors include hypertension and hyperlipidemia. I have recommended proceeding with ETT-Myoview to rule out the possibility of ischemia.  Dizziness:   Etiology not entirely clear. She does have a history of PVCs. She may be experiencing an increased amount of these. I will arrange an event monitor to rule out significant bradycardia or tachyarrhythmias.   Carotid stenosis:   Follow up carotid US due 02/2014.  Essential hypertension:  Controlled.  HLD (hyperlipidemia):  Continue statin.    Disposition:  Follow up with Dr. Lauree Chandler in 6 weeks.    Signed, Versie Starks, MHS 09/24/2013 12:43 PM    [facility] [place], [place], [place]  [address] Phone: [phone]; Fax: [phone]

## 2013-09-26 ENCOUNTER — Ambulatory Visit: Payer: Medicare Other | Admitting: Cardiovascular Disease

## 2013-09-26 ENCOUNTER — Encounter (INDEPENDENT_AMBULATORY_CARE_PROVIDER_SITE_OTHER): Payer: Medicare Other

## 2013-09-26 ENCOUNTER — Telehealth: Payer: Self-pay | Admitting: Cardiovascular Disease

## 2013-09-26 ENCOUNTER — Encounter: Payer: Self-pay | Admitting: Radiology

## 2013-09-26 ENCOUNTER — Ambulatory Visit (HOSPITAL_COMMUNITY): Payer: Medicare Other | Attending: Cardiology | Admitting: Radiology

## 2013-09-26 VITALS — BP 139/73 | HR 63 | Ht 63.0 in | Wt 162.0 lb

## 2013-09-26 DIAGNOSIS — R0609 Other forms of dyspnea: Secondary | ICD-10-CM | POA: Insufficient documentation

## 2013-09-26 DIAGNOSIS — I739 Peripheral vascular disease, unspecified: Secondary | ICD-10-CM

## 2013-09-26 DIAGNOSIS — Z8249 Family history of ischemic heart disease and other diseases of the circulatory system: Secondary | ICD-10-CM | POA: Diagnosis not present

## 2013-09-26 DIAGNOSIS — R5383 Other fatigue: Secondary | ICD-10-CM

## 2013-09-26 DIAGNOSIS — R0989 Other specified symptoms and signs involving the circulatory and respiratory systems: Secondary | ICD-10-CM | POA: Insufficient documentation

## 2013-09-26 DIAGNOSIS — R5381 Other malaise: Secondary | ICD-10-CM | POA: Insufficient documentation

## 2013-09-26 DIAGNOSIS — R6889 Other general symptoms and signs: Secondary | ICD-10-CM

## 2013-09-26 DIAGNOSIS — R002 Palpitations: Secondary | ICD-10-CM | POA: Diagnosis not present

## 2013-09-26 DIAGNOSIS — R55 Syncope and collapse: Secondary | ICD-10-CM

## 2013-09-26 DIAGNOSIS — I779 Disorder of arteries and arterioles, unspecified: Secondary | ICD-10-CM | POA: Diagnosis not present

## 2013-09-26 DIAGNOSIS — R42 Dizziness and giddiness: Secondary | ICD-10-CM

## 2013-09-26 MED ORDER — TECHNETIUM TC 99M SESTAMIBI GENERIC - CARDIOLITE
11.0000 | Freq: Once | INTRAVENOUS | Status: AC | PRN
  Administered 2013-09-26: 11 via INTRAVENOUS

## 2013-09-26 MED ORDER — TECHNETIUM TC 99M SESTAMIBI GENERIC - CARDIOLITE
33.0000 | Freq: Once | INTRAVENOUS | Status: AC | PRN
  Administered 2013-09-26: 33 via INTRAVENOUS

## 2013-09-26 NOTE — Progress Notes (Signed)
Patient ID: [name], female   DOB: 1940/04/08, 73 y.o.   MRN: [id number] [employer] 30 day monitor applied 10-25-13

## 2013-09-26 NOTE — Progress Notes (Signed)
[place] [facility] [address] [place], [place] [address] [phone]    Cardiology Nuclear Med Study  [name] is a 73 y.o. female     MRN : [id number]     DOB: March 26, 1940  Procedure Date: 09/26/2013  Nuclear Med Background Indication for Stress Test:  Evaluation for Ischemia History:  No known CAD, Echo 2012 EF 55-65% Cardiac Risk Factors: Carotid Disease, Family History - CAD, Hypertension and Lipids  Symptoms:  Dizziness, Fatigue, Near Syncope and Palpitations   Nuclear Pre-Procedure Caffeine/Decaff Intake:  None NPO After: 7:30am   Lungs:  clear O2 Sat: 98% on room air. IV 0.9% NS with Angio Cath:  22g  IV Site: R Hand  IV Started by:  Crissie Figures, RN  Chest Size (in):  38 Cup Size: B  Height: 5\' 3"  (1.6 m)  Weight:  162 lb (73.483 kg)  BMI:  Body mass index is 28.7 kg/(m^2). Tech Comments:  Lopressor x > 24 hrs    Nuclear Med Study 1 or 2 day study: 1 day  Stress Test Type:  Stress  Reading MD: N/A  Order Authorizing Provider:  Lauree Chandler, MD  Resting Radionuclide: Technetium 38m Sestamibi  Resting Radionuclide Dose: 11.0 mCi   Stress Radionuclide:  Technetium 55m Sestamibi  Stress Radionuclide Dose: 33.0 mCi           Stress Protocol Rest HR: 63 Stress HR: 155  Rest BP: 139/73 Stress BP: 140/85  Exercise Time (min): 3:30 METS: 4.6           Dose of Adenosine (mg):  n/a Dose of Lexiscan: n/a mg  Dose of Atropine (mg): n/a Dose of Dobutamine: n/a mcg/kg/min (at max HR)  Stress Test Technologist: Glade Lloyd, BS-ES  Nuclear Technologist:  Vedia Pereyra, CNMT     Rest Procedure:  Myocardial perfusion imaging was performed at rest 45 minutes following the intravenous administration of Technetium 33m Sestamibi. Rest ECG: NSR - Normal EKG  Stress Procedure:  The patient exercised on the treadmill utilizing the Bruce Protocol for 3:30 minutes. The patient stopped due to being very SOB and denied any chest pain.   Technetium 61m Sestamibi was injected at peak exercise and myocardial perfusion imaging was performed after a brief delay. Stress ECG: There are scattered PVCs.  QPS Raw Data Images:  Normal; no motion artifact; normal heart/lung ratio. Stress Images:  Normal homogeneous uptake in all areas of the myocardium. Rest Images:  Normal homogeneous uptake in all areas of the myocardium. Subtraction (SDS):  Normal Transient Ischemic Dilatation (Normal <1.22):  1.23 Lung/Heart Ratio (Normal <0.45):  0.33  Quantitative Gated Spect Images QGS EDV:  77 ml QGS ESV:  30 ml  Impression Exercise Capacity:  Poor exercise capacity. BP Response:  Normal blood pressure response. Clinical Symptoms:  Significant dyspnea ECG Impression:  No significant ST segment change suggestive of ischemia. Comparison with Prior Nuclear Study: No previous nuclear study performed  Overall Impression:  Normal stress nuclear study.  LV Ejection Fraction: 61%.  LV Wall Motion:  Normal Wall Motion  Pixie Casino, MD, [facility] Board Certified in Nuclear Cardiology Attending Cardiologist [place]

## 2013-09-26 NOTE — Telephone Encounter (Signed)
°  Patient has questions about medication. Please call and advise.

## 2013-09-26 NOTE — Telephone Encounter (Signed)
Spoke with pt who was instructed to hold lopressor prior to stress test. She had stress test today. I told pt she could resume lopressor.

## 2013-09-27 ENCOUNTER — Encounter: Payer: Self-pay | Admitting: Physician Assistant

## 2013-09-30 ENCOUNTER — Encounter: Payer: Self-pay | Admitting: Cardiovascular Disease

## 2013-10-01 ENCOUNTER — Telehealth: Payer: Self-pay | Admitting: *Deleted

## 2013-10-01 NOTE — Telephone Encounter (Signed)
ptcb and has been notified about normal myoview results. Pt verbalized understanding

## 2013-10-01 NOTE — Telephone Encounter (Signed)
lmptcb for myoview results 

## 2013-10-16 ENCOUNTER — Ambulatory Visit: Payer: Medicare Other | Admitting: Cardiology

## 2013-10-31 ENCOUNTER — Telehealth: Payer: Self-pay | Admitting: *Deleted

## 2013-10-31 NOTE — Telephone Encounter (Signed)
Spoke with pt and reviewed monitor results with her. She has appointment with Dr. Angelena Form on November 04, 2013 and will discuss exercise parameters and on going episodes of palpitations with him at that appt.

## 2013-10-31 NOTE — Telephone Encounter (Signed)
I placed call to pt to review monitor results. Left message to call back 

## 2013-11-04 ENCOUNTER — Ambulatory Visit (INDEPENDENT_AMBULATORY_CARE_PROVIDER_SITE_OTHER): Payer: Medicare Other | Admitting: Cardiovascular Disease

## 2013-11-04 ENCOUNTER — Encounter: Payer: Self-pay | Admitting: Cardiovascular Disease

## 2013-11-04 VITALS — BP 116/78 | HR 68 | Ht 63.0 in | Wt 164.8 lb

## 2013-11-04 DIAGNOSIS — E785 Hyperlipidemia, unspecified: Secondary | ICD-10-CM

## 2013-11-04 DIAGNOSIS — I493 Ventricular premature depolarization: Secondary | ICD-10-CM

## 2013-11-04 DIAGNOSIS — F419 Anxiety disorder, unspecified: Secondary | ICD-10-CM

## 2013-11-04 DIAGNOSIS — I4949 Other premature depolarization: Secondary | ICD-10-CM

## 2013-11-04 DIAGNOSIS — F411 Generalized anxiety disorder: Secondary | ICD-10-CM

## 2013-11-04 DIAGNOSIS — I10 Essential (primary) hypertension: Secondary | ICD-10-CM

## 2013-11-04 MED ORDER — DILTIAZEM HCL ER COATED BEADS 120 MG PO CP24: 120.0000 mg | ORAL_CAPSULE | Freq: Every day | ORAL | Status: DC

## 2013-11-04 MED ORDER — SIMVASTATIN 10 MG PO TABS: 10.0000 mg | ORAL_TABLET | Freq: Every day | ORAL | Status: DC

## 2013-11-04 NOTE — Progress Notes (Signed)
History of Present Illness: 73 yo WF with history of hyperlipidemia, HTN, and multiple GI issues including diverticulosis, PUD and GERD along with IBS and fibromyalgia who is here today for cardiac follow up. She was seen as a new patient for further evaluation of palpitations on 05/18/09. She has had known PVCs for many years. Echo in October 2011 with mild global hypokinesis with EF of 45-50%. Her 48 hour Holter monitor showed frequent PVCs with some trigeminy and bigeminy. Her beta blocker was increased and she cut out caffeine.  She has been on Lisinopril. In May 2013, the patient was cleaning her mother's house in [place] and developed 6 days of dizziness with moving around and ringing in her ears. She started keeping track of her blood pressure and it would fluctuate between 105/60 and 143/93. She was diagnosed with vertigo and she was placed on meclizine. CT of her brain was normal. Repeat echo 10/12 with LVEF 55%, mild LVH, mild MR, mild AI. She was seen in the emergency room 09/22/13 with dizziness associated with diaphoresis and near syncope while picking up sticks in her yard. EKG in the emergency room demonstrated sinus rhythm with frequent PVCs and no significant ST changes. She was seen in our office by Richardson Dopp, PA-C on 09/24/13. She has been seeing an ENT for dizziness. She takes Meclizine daily. Vestibular evaluation is pending. Stress myoview normal 09/26/13. 30 day event monitor with PVCs but no SVT, atrial fib, VT. No pauses or bradycardia.   She is here today for cardiac follow up. She has many questions and has a 5 page typed paper outlining all of her questions. She describes walking down the driveway and having a quick elevation in her heart rate. She then feels dizzy and notices palpitations. This does not occur every day. She does not feel palpitations every day. She does describe ongoing dizziness but no syncope. She is having vestibular testing next week. No chest pain or SOB.  BP has been well controlled at home.   Primary Care Physician: Crist Infante   Past Medical History  Diagnosis Date  . Arrhythmia     PVC  . Hypercholesteremia   . Osteoporosis   . Anxiety   . Sleep disorder   . PUD (peptic ulcer disease)   . Appendicitis   . Hx of adenomatous colonic polyps   . Diverticulitis   . GERD (gastroesophageal reflux disease)   . IBS (irritable bowel syndrome)   . Fibromyalgia   . Hypertension   . Thyroid disease   . Palpitation   . Back abscess   . PVC (premature ventricular contraction)   . Hx of cardiovascular stress test     ETT-Myoview (8/15): Poor exercise capacity, no ischemia, EF 61%-normal study    Past Surgical History  Procedure Laterality Date  . Appendectomy    . Wrist surgery      left  . Carpal tunnel release      left  . Abdominal hysterectomy      Current Outpatient Prescriptions  Medication Sig Dispense Refill  . acetaminophen (TYLENOL) 500 MG tablet Take 500 mg by mouth 2 (two) times daily as needed (pain).      Marland Kitchen aspirin EC 81 MG tablet Take 1 tablet (81 mg total) by mouth daily.  30 tablet  3  . desoximetasone (TOPICORT) 0.25 % cream Apply 1 application topically 2 (two) times daily as needed (lichen planus on legs).       Marland Kitchen dicyclomine (  BENTYL) 10 MG capsule Take 10 mg by mouth daily as needed for spasms (colon spasms).       . ergocalciferol (VITAMIN D2) 50000 UNITS capsule Take 50,000 Units by mouth See admin instructions. Take 1 capsule (50,000 units) every Saturday and an additional capsule on the last Monday of each month      . esomeprazole (NEXIUM) 20 MG capsule Take 20 mg by mouth at bedtime.      . ibandronate (BONIVA) 150 MG tablet Take 150 mg by mouth every 30 (thirty) days. On the 1st of each month      . ibuprofen (ADVIL,MOTRIN) 200 MG tablet Take 200 mg by mouth daily as needed (pain).      . meclizine (ANTIVERT) 25 MG tablet Take 25 mg by mouth daily.       . metoprolol tartrate (LOPRESSOR) 25 MG tablet  Take 1 tablet (25 mg total) by mouth 2 (two) times daily.  180 tablet  3  . simvastatin (ZOCOR) 40 MG tablet Take 40 mg by mouth at bedtime.         No current facility-administered medications for this visit.    Allergies  Allergen Reactions  . Penicillins Swelling    By second day there was swelling of tongue and could not breathe  . Sulfonamide Derivatives Hives and Rash    Where applied.  . Iodinated Diagnostic Agents Other (See Comments)    Unknown allergy to unknown contrast during gb test many yrs ago//a.calhoun  . Metronidazole Diarrhea  . Prednisone Other (See Comments)    Due to cataract on eye and history of PVC's    History   Social History  . Marital Status: Widowed    Spouse Name: N/A    Number of Children: N/A  . Years of Education: N/A   Occupational History  . Not on file.   Social History Main Topics  . Smoking status: Never Smoker   . Smokeless tobacco: Never Used  . Alcohol Use: No  . Drug Use: No  . Sexual Activity: Not on file   Other Topics Concern  . Not on file   Social History Narrative  . No narrative on file    Family History  Problem Relation Age of Onset  . Heart attack Maternal Grandfather 72  . Glaucoma Mother   . Macular degeneration Mother   . Lung cancer Father   . Lung cancer Brother   . Tuberculosis Paternal Grandfather   . Colon cancer Paternal Grandmother 66  . Heart attack Father   . Cancer Father   . Stroke Mother   . Hypertension Mother   . Hypertension Brother   . Diabetes Brother   . Cancer Brother   . Cancer    . Cancer Paternal Grandmother     Review of Systems:  As stated in the HPI and otherwise negative.   BP 116/78  Pulse 68  Ht 5\' 3"  (1.6 m)  Wt 164 lb 12.8 oz (74.753 kg)  BMI 29.20 kg/m2  Physical Examination: General: Well developed, well nourished, NAD HEENT: OP clear, mucus membranes moist SKIN: warm, dry. No rashes. Neuro: No focal deficits Musculoskeletal: Muscle strength 5/5 all  ext Psychiatric: Mood and affect normal Neck: No JVD, no carotid bruits, no thyromegaly, no lymphadenopathy. Lungs:Clear bilaterally, no wheezes, rhonci, crackles Cardiovascular: Regular rate and rhythm. No murmurs, gallops or rubs. Abdomen:Soft. Bowel sounds present. Non-tender.  Extremities: No lower extremity edema. Pulses are 2 + in the bilateral DP/PT.  Assessment  and Plan:   1. PVCs: she is now symptomatic presumably with some component from her PVCs. She also seems to have a component of anxiety. I have reviewed her event monitor and stress test. No evidence of atrial fib/NSVT or SVT. Will change Lopressor to Cardizem CD 120 mg daily. She is asking to see EP to discuss PVCs, abnormal heart rate response to exercise and dizziness. She feels that her heart is racing when she minimally exerts herself although she felt this way while wearing her monitor and I did not see any evidence of tachycardia.   2. HLD: will lower zocor to 10 mg daily while taking Cardizem.   3. HTN: BP is controlled.   4. Anxiety: I suspect that this plays a major role in her symptoms. She should discuss this with primary care.

## 2013-11-04 NOTE — Patient Instructions (Addendum)
Your physician wants you to follow-up in: 6 months.   You will receive a reminder letter in the mail two months in advance. If you don't receive a letter, please call our office to schedule the follow-up appointment.  You have been referred to Dr. Caryl Comes in  Electrophysiology.  Please schedule pt for new pt appointment.   Your physician has recommended you make the following change in your medication:   Stop metoprolol. Start Cardizem CD 120 mg by mouth daily.  Decrease simvastatin to 10 mg by mouth daily.

## 2013-11-19 ENCOUNTER — Telehealth: Payer: Self-pay | Admitting: Cardiovascular Disease

## 2013-11-19 NOTE — Telephone Encounter (Signed)
New message     Talk to the nurse about her medication----she is on diltiazem and 81mg  aspirin.  Pt has a concern about taking them both together.

## 2013-11-19 NOTE — Telephone Encounter (Signed)
Walk In pt Form " Sealed Envelope" gave to [facility] 9.29.15/km

## 2013-11-19 NOTE — Telephone Encounter (Signed)
Spoke with pt who read  there was a possible interaction when taking Cardizem and aspirin. I reviewed with pharmacist and told pt it was OK to take these.  She has written paperwork detailing how she is feeling since started on Cardizem.  She will drop this off in the office today for Dr. Angelena Form to review.

## 2013-11-19 NOTE — Telephone Encounter (Signed)
Dr. Angelena Form reviewed paperwork brought in by pt and readings are OK. She should continue same medicines and keep scheduled appt with Dr Caryl Comes in October. I spoke with pt and gave her this information.

## 2013-12-06 ENCOUNTER — Other Ambulatory Visit: Payer: Self-pay

## 2013-12-12 ENCOUNTER — Ambulatory Visit (INDEPENDENT_AMBULATORY_CARE_PROVIDER_SITE_OTHER): Payer: Medicare Other | Admitting: Internal Medicine

## 2013-12-12 ENCOUNTER — Encounter: Payer: Self-pay | Admitting: Internal Medicine

## 2013-12-12 VITALS — BP 142/70 | HR 84 | Ht 63.0 in | Wt 163.6 lb

## 2013-12-12 DIAGNOSIS — I493 Ventricular premature depolarization: Secondary | ICD-10-CM

## 2013-12-12 DIAGNOSIS — R42 Dizziness and giddiness: Secondary | ICD-10-CM

## 2013-12-12 NOTE — Patient Instructions (Signed)
We will see you back on an as needed basis.  Your physician recommends that you continue on your current medications as directed. Please refer to the Current Medication list given to you today.  

## 2013-12-12 NOTE — Progress Notes (Signed)
ELECTROPHYSIOLOGY CONSULT NOTE  Patient ID: CASH MEADOW, MRN: [id number], DOB/AGE: 11-08-1940 73 y.o. Admit date: (Not on file) Date of Consult: 12/12/2013  Primary Physician: Jerlyn Ly, MD Primary Cardiologist: CM  Chief Complaint: dizziness   HPI [name] is a 73 y.o. female   She has a long-standing history of PVCs with a 40 hour monitor in 2011 demonstrating 1.5% PVCs  LV function at that time was read as 45-50%. More recently an echo 8/12 demonstrated normal ejection fraction. Myoview scanning 8/15 was normal. A 30 day event recorder was undertaken as for palpitations. It demonstrated also PVCs but no other arrhythmia.  She is also having concomitant dizziness. She has been treated with meclizine and has been seeing ENT. Vestibular evaluation was outstanding 9/15.her meclizine she stopped on her own shortly after stopping her beta blocker. Her dizziness is largely resolved.  She continues to have palpitations with PVCs. She has been assured that they're not life-threatening and she is trying to convince himself of that when they recur. She is doing better at this.  When she underwent her Myoview scan was done with treadmill testing.  It was noted that her heart rate was 155 and 3.5 minutes. It was recommended that she begin exercise training.  It is notable that she had a TIA with transient visual loss. Her ophthalmologist of that she might have had thromboembolic event to her eye. She does not have a history of atrial fibrillation    Past Medical History  Diagnosis Date  . Arrhythmia     PVC  . Hypercholesteremia   . Osteoporosis   . Anxiety   . Sleep disorder   . PUD (peptic ulcer disease)   . Appendicitis   . Hx of adenomatous colonic polyps   . Diverticulitis   . GERD (gastroesophageal reflux disease)   . IBS (irritable bowel syndrome)   . Fibromyalgia   . Hypertension   . Thyroid disease   . Palpitation   . Back abscess   . PVC  (premature ventricular contraction)   . Hx of cardiovascular stress test     ETT-Myoview (8/15): Poor exercise capacity, no ischemia, EF 61%-normal study      Surgical History:  Past Surgical History  Procedure Laterality Date  . Appendectomy    . Wrist surgery      left  . Carpal tunnel release      left  . Abdominal hysterectomy       Home Meds: Prior to Admission medications   Medication Sig Start Date End Date Taking? Authorizing Provider  acetaminophen (TYLENOL) 500 MG tablet Take 500 mg by mouth 2 (two) times daily as needed (pain).   Yes Historical Provider, MD  aspirin EC 81 MG tablet Take 1 tablet (81 mg total) by mouth daily. 12/18/12  Yes Burnell Blanks, MD  desoximetasone (TOPICORT) 0.25 % cream Apply 1 application topically 2 (two) times daily as needed (lichen planus on legs).    Yes Historical Provider, MD  dicyclomine (BENTYL) 10 MG capsule Take 10 mg by mouth daily as needed for spasms (colon spasms).  07/26/11  Yes Lafayette Dragon, MD  diltiazem (CARDIZEM CD) 120 MG 24 hr capsule Take 1 capsule (120 mg total) by mouth daily. 11/04/13  Yes Burnell Blanks, MD  ergocalciferol (VITAMIN D2) 50000 UNITS capsule Take 50,000 Units by mouth See admin instructions. Take 1 capsule (50,000 units) every Saturday and an additional capsule on the last Monday of each month  Yes Historical Provider, MD  esomeprazole (NEXIUM) 20 MG capsule Take 20 mg by mouth at bedtime.   Yes Historical Provider, MD  ibandronate (BONIVA) 150 MG tablet Take 150 mg by mouth every 30 (thirty) days. On the 1st of each month 08/16/13  Yes Historical Provider, MD  ibuprofen (ADVIL,MOTRIN) 200 MG tablet Take 200 mg by mouth daily as needed (pain).   Yes Historical Provider, MD  simvastatin (ZOCOR) 10 MG tablet Take 1 tablet (10 mg total) by mouth at bedtime. 11/04/13  Yes Burnell Blanks, MD      Allergies:  Allergies  Allergen Reactions  . Penicillins Swelling    By second day there  was swelling of tongue and could not breathe  . Sulfonamide Derivatives Hives and Rash    Where applied.  . Iodinated Diagnostic Agents Other (See Comments)    Unknown allergy to unknown contrast during gb test many yrs ago//a.calhoun  . Metronidazole Diarrhea  . Prednisone Other (See Comments)    Due to cataract on eye and history of PVC's    History   Social History  . Marital Status: Widowed    Spouse Name: N/A    Number of Children: N/A  . Years of Education: N/A   Occupational History  . Not on file.   Social History Main Topics  . Smoking status: Never Smoker   . Smokeless tobacco: Never Used  . Alcohol Use: No  . Drug Use: No  . Sexual Activity: Not on file   Other Topics Concern  . Not on file   Social History Narrative  . No narrative on file     Family History  Problem Relation Age of Onset  . Heart attack Maternal Grandfather 72  . Glaucoma Mother   . Macular degeneration Mother   . Lung cancer Father   . Lung cancer Brother   . Tuberculosis Paternal Grandfather   . Colon cancer Paternal Grandmother 6  . Heart attack Father   . Cancer Father   . Stroke Mother   . Hypertension Mother   . Hypertension Brother   . Diabetes Brother   . Cancer Brother   . Cancer    . Cancer Paternal Grandmother      ROS:  Please see the history of present illness.     All other systems reviewed and negative.    Physical Exam:   Blood pressure 142/70, pulse 84, height 5\' 3"  (1.6 m), weight 163 lb 9.6 oz (74.208 kg). General: Well developed, well nourished female in no acute distress. Head: Normocephalic, atraumatic, sclera non-icteric, no xanthomas, nares are without discharge. EENT: normal Lymph Nodes:  none Back: without scoliosis/kyphosis , no CVA tendersness Neck: Negative for carotid bruits. JVD not elevated. Lungs: Clear bilaterally to auscultation without wheezes, rales, or rhonchi. Breathing is unlabored. Heart: RRR with S1 S2. No  murmur , rubs, or  gallops appreciated. Abdomen: Soft, non-tender, non-distended with normoactive bowel sounds. No hepatomegaly. No rebound/guarding. No obvious abdominal masses. Msk:  Strength and tone appear normal for age. Extremities: No clubbing or cyanosis. No  edema.  Distal pedal pulses are 2+ and equal bilaterally. Skin: Warm and Dry Neuro: Alert and oriented X 3. CN III-XII intact Grossly normal sensory and motor function . Psych:  Responds to questions appropriately with a normal affect.      Labs: Cardiac Enzymes No results found for this basename: CKTOTAL, CKMB, TROPONINI,  in the last 72 hours CBC Lab Results  Component Value Date  WBC 8.0 09/21/2013   HGB 14.5 09/21/2013   HCT 43.5 09/21/2013   MCV 91.0 09/21/2013   PLT 243 09/21/2013   PROTIME: No results found for this basename: LABPROT, INR,  in the last 72 hours Chemistry No results found for this basename: NA, K, CL, CO2, BUN, CREATININE, CALCIUM, LABALBU, PROT, BILITOT, ALKPHOS, ALT, AST, GLUCOSE,  in the last 168 hours Lipids No results found for this basename: CHOL, HDL, LDLCALC, TRIG   BNP No results found for this basename: probnp   Miscellaneous No results found for this basename: DDIMER    Radiology/Studies:  No results found.  EKG: * sinus rhythm at 84 Intervals 19/09/are Occasional PVC Early R wave   Assessment and Plan:  Dizziness  PVCs associated with palpitations  Sinus tachycardia with exercise  Her dizziness has abated since she discontinued her beta blocker and her meclizine.  PVCs continued be a problem although she has been reassured that heart function and coronary perfusion are normal. She is trying to talk herself from these episodes and thre is no need to become discombobulated. I've encouraged her in this. Potentially we could use flecainide or propafenone as an antiarrhythmic for PVCs impression if her symptoms dictate; at this point given there risk for pro arrhythmia, she is not inclined to pursue  therapy that way.  I will attempt to retrieve her exercise test. The heart rate from 63--155 with exercise speaks to poor exercise capacity or possibly an atrial tachycardia.;given the potential benefits of a beta blocker been balance by the possibility that it was responsible for her dizziness she is inclined to continue on her current course using diltiazem. We have stressed the importance of exercise and I reiterated with Dr. Joylene Draft told her previously  We will see her as needed    Virl Axe

## 2014-04-14 ENCOUNTER — Other Ambulatory Visit: Payer: Self-pay | Admitting: Radiology

## 2014-04-14 DIAGNOSIS — I6523 Occlusion and stenosis of bilateral carotid arteries: Secondary | ICD-10-CM

## 2014-05-05 ENCOUNTER — Ambulatory Visit (INDEPENDENT_AMBULATORY_CARE_PROVIDER_SITE_OTHER): Payer: Medicare Other | Admitting: Cardiovascular Disease

## 2014-05-05 ENCOUNTER — Ambulatory Visit (HOSPITAL_COMMUNITY): Payer: Medicare Other | Attending: Cardiology | Admitting: Cardiology

## 2014-05-05 ENCOUNTER — Encounter: Payer: Self-pay | Admitting: Cardiovascular Disease

## 2014-05-05 VITALS — BP 142/84 | HR 85 | Ht 63.0 in | Wt 151.4 lb

## 2014-05-05 DIAGNOSIS — F419 Anxiety disorder, unspecified: Secondary | ICD-10-CM

## 2014-05-05 DIAGNOSIS — I6523 Occlusion and stenosis of bilateral carotid arteries: Secondary | ICD-10-CM

## 2014-05-05 DIAGNOSIS — I493 Ventricular premature depolarization: Secondary | ICD-10-CM

## 2014-05-05 DIAGNOSIS — R42 Dizziness and giddiness: Secondary | ICD-10-CM | POA: Diagnosis present

## 2014-05-05 DIAGNOSIS — I10 Essential (primary) hypertension: Secondary | ICD-10-CM

## 2014-05-05 DIAGNOSIS — I739 Peripheral vascular disease, unspecified: Secondary | ICD-10-CM

## 2014-05-05 DIAGNOSIS — I340 Nonrheumatic mitral (valve) insufficiency: Secondary | ICD-10-CM

## 2014-05-05 DIAGNOSIS — I779 Disorder of arteries and arterioles, unspecified: Secondary | ICD-10-CM

## 2014-05-05 NOTE — Patient Instructions (Signed)

## 2014-05-05 NOTE — Progress Notes (Signed)
History of Present Illness: 74 yo WF with history of severe anxiety, PVCs, hyperlipidemia, HTN, and multiple GI issues including diverticulosis, PUD and GERD along with IBS and fibromyalgia who is here today for cardiac follow up. She was seen as a new patient for further evaluation of palpitations on 05/18/09. She has had known PVCs for many years. Echo in October 2011 with mild global hypokinesis with EF of 45-50%. Her 48 hour Holter monitor showed frequent PVCs with some trigeminy and bigeminy. Her beta blocker was increased and she cut out caffeine.  She has been on Lisinopril. In May 2013, the patient was cleaning her mother's house in [place] and developed 6 days of dizziness with moving around and ringing in her ears. She started keeping track of her blood pressure and it would fluctuate between 105/60 and 143/93. She was diagnosed with vertigo and she was placed on meclizine. CT of her brain was normal. Repeat echo 10/12 with LVEF 55%, mild LVH, mild MR, mild AI. She was seen in the emergency room 09/22/13 with dizziness associated with diaphoresis and near syncope while picking up sticks in her yard. EKG in the emergency room demonstrated sinus rhythm with frequent PVCs and no significant ST changes. She was seen in our office by Richardson Dopp, PA-C on 09/24/13. She has been seeing an ENT for dizziness. She takes Meclizine daily. Vestibular evaluation is pending. Stress myoview normal 09/26/13. 30 day event monitor with PVCs but no SVT, atrial fib, VT. No pauses or bradycardia. Metoprolol was changed to Cardizem. She was seen by Dr. Caryl Comes 12/12/13 and no changes were made. He reassured her that her PVCs are not dangerous.   She is here today for cardiac follow up. She has many questions and has a 4 page typed paper outlining all of her questions with two pages of vitals, 2 pages of questions.  No chest pain or SOB. BP has been well controlled at home. HR has been well controlled. She is most concerned  today her weight loss and has discussed this with Dr. Joylene Draft. She also wonders if Diltiazem could be causing weight loss. She has had no dizziness, near syncope or syncope.   Primary Care Physician: Crist Infante   Past Medical History  Diagnosis Date  . TIA (transient ischemic attack)   . Osteoporosis   . Anxiety   . Sleep disorder   . PUD (peptic ulcer disease)   . Appendicitis   . Hx of adenomatous colonic polyps   . Diverticulitis   . GERD (gastroesophageal reflux disease)   . IBS (irritable bowel syndrome)   . Fibromyalgia   . Hypertension   . Thyroid disease   . Back abscess   . PVC (premature ventricular contraction)   . Hx of cardiovascular stress test     ETT-Myoview (8/15): Poor exercise capacity, no ischemia, EF 61%-normal study    Past Surgical History  Procedure Laterality Date  . Appendectomy    . Wrist surgery      left  . Carpal tunnel release      left  . Abdominal hysterectomy      Current Outpatient Prescriptions  Medication Sig Dispense Refill  . acetaminophen (TYLENOL) 500 MG tablet Take 500 mg by mouth 2 (two) times daily as needed (pain).    Marland Kitchen aspirin EC 81 MG tablet Take 1 tablet (81 mg total) by mouth daily. 30 tablet 3  . desoximetasone (TOPICORT) 0.25 % cream Apply 1 application topically 2 (two) times  daily as needed (lichen planus on legs).     Marland Kitchen dicyclomine (BENTYL) 10 MG capsule Take 10 mg by mouth daily as needed for spasms (colon spasms).     Marland Kitchen diltiazem (CARDIZEM CD) 120 MG 24 hr capsule Take 1 capsule (120 mg total) by mouth daily. 30 capsule 11  . ENSURE PLUS (ENSURE PLUS) LIQD Take 237 mLs by mouth daily.    . ergocalciferol (VITAMIN D2) 50000 UNITS capsule Take 50,000 Units by mouth See admin instructions. Take 1 capsule (50,000 units) every Saturday and an additional capsule on the last Monday of each month    . esomeprazole (NEXIUM) 20 MG capsule Take 20 mg by mouth at bedtime.    Marland Kitchen ibuprofen (ADVIL,MOTRIN) 200 MG tablet Take 200 mg  by mouth daily as needed (pain).     No current facility-administered medications for this visit.    Allergies  Allergen Reactions  . Penicillins Swelling    By second day there was swelling of tongue and could not breathe  . Sulfonamide Derivatives Hives and Rash    Where applied.  . Iodinated Diagnostic Agents Other (See Comments)    Unknown allergy to unknown contrast during gb test many yrs ago//a.calhoun  . Metronidazole Diarrhea  . Prednisone Other (See Comments)    Due to cataract on eye and history of PVC's    History   Social History  . Marital Status: Widowed    Spouse Name: N/A  . Number of Children: N/A  . Years of Education: N/A   Occupational History  . Not on file.   Social History Main Topics  . Smoking status: Never Smoker   . Smokeless tobacco: Never Used  . Alcohol Use: No  . Drug Use: No  . Sexual Activity: Not on file   Other Topics Concern  . Not on file   Social History Narrative    Family History  Problem Relation Age of Onset  . Heart attack Maternal Grandfather 72  . Glaucoma Mother   . Macular degeneration Mother   . Lung cancer Father   . Lung cancer Brother   . Tuberculosis Paternal Grandfather   . Colon cancer Paternal Grandmother 45  . Heart attack Father   . Cancer Father   . Stroke Mother   . Hypertension Mother   . Hypertension Brother   . Diabetes Brother   . Cancer Brother   . Cancer    . Cancer Paternal Grandmother     Review of Systems:  As stated in the HPI and otherwise negative.   BP 142/84 mmHg  Pulse 85  Ht 5\' 3"  (1.6 m)  Wt 151 lb 6.4 oz (68.675 kg)  BMI 26.83 kg/m2  Physical Examination: General: Well developed, well nourished, NAD HEENT: OP clear, mucus membranes moist SKIN: warm, dry. No rashes. Neuro: No focal deficits Musculoskeletal: Muscle strength 5/5 all ext Psychiatric: Mood and affect normal Neck: No JVD, no carotid bruits, no thyromegaly, no lymphadenopathy. Lungs:Clear bilaterally,  no wheezes, rhonci, crackles Cardiovascular: Regular rate and rhythm. No murmurs, gallops or rubs. Abdomen:Soft. Bowel sounds present. Non-tender.  Extremities: No lower extremity edema. Pulses are 2 + in the bilateral DP/PT.  Assessment and Plan:   1. PVCs: No changed in her palpitations which occur daily but she has learned to live with it. Her palpitations are presumably driven by her PVCs. She has seen Dr. Caryl Comes in the EP clinic and he agreed with Cardizem therapy without further workup or addition of anti-arrhythmic therapy.  I think her PVCs are driven by anxiety. Will continue Cardizem CD 120 mg daily. I have once again reassured her that her PVCs are benign. I have answered all of her questions today. I do not think her Cardizem therapy is contributing to her weight loss.   2. HLD: Followed in primary care.   3. HTN: BP is controlled. I have reviewed the extensive log of blood pressures from home. No changes today.   4. Anxiety: I suspect that this plays a major role in her symptoms. She should discuss this with primary care. I think she would benefit from daily anti-anxiety therapy.   5. Mitral regurgitation: Mild by echo 2012. Repeat echo now.   6. Carotid artery disease: Mild (0-39% bilaterally) by dopplers today

## 2014-05-05 NOTE — Progress Notes (Signed)
Carotid Duplex performed. 

## 2014-05-09 ENCOUNTER — Other Ambulatory Visit: Payer: Self-pay

## 2014-05-09 ENCOUNTER — Ambulatory Visit (HOSPITAL_COMMUNITY): Payer: Medicare Other | Attending: Cardiology

## 2014-05-09 DIAGNOSIS — Z1231 Encounter for screening mammogram for malignant neoplasm of breast: Secondary | ICD-10-CM

## 2014-05-09 DIAGNOSIS — I340 Nonrheumatic mitral (valve) insufficiency: Secondary | ICD-10-CM | POA: Insufficient documentation

## 2014-05-09 NOTE — Progress Notes (Signed)
2D Echo completed. 05/09/2014

## 2014-05-16 ENCOUNTER — Ambulatory Visit
Admission: RE | Admit: 2014-05-16 | Discharge: 2014-05-16 | Disposition: A | Discharge status: Home / Self Care | Payer: Medicare Other | Source: Ambulatory Visit

## 2014-05-16 DIAGNOSIS — Z1231 Encounter for screening mammogram for malignant neoplasm of breast: Secondary | ICD-10-CM

## 2014-07-07 ENCOUNTER — Encounter: Payer: Self-pay | Admitting: Nurse Practitioner

## 2014-07-22 ENCOUNTER — Ambulatory Visit: Payer: Medicare Other | Admitting: Nurse Practitioner

## 2014-08-18 ENCOUNTER — Other Ambulatory Visit: Payer: Self-pay

## 2014-09-07 ENCOUNTER — Other Ambulatory Visit: Payer: Self-pay | Admitting: Cardiovascular Disease

## 2014-09-30 ENCOUNTER — Other Ambulatory Visit: Payer: Self-pay | Admitting: Cardiovascular Disease

## 2014-10-01 NOTE — Telephone Encounter (Signed)
Ok to refill   thanks

## 2014-10-01 NOTE — Telephone Encounter (Signed)
Please advise on refill.

## 2015-03-24 ENCOUNTER — Other Ambulatory Visit: Payer: Self-pay | Admitting: Cardiovascular Disease

## 2015-03-24 NOTE — Telephone Encounter (Signed)
OK to refill

## 2015-03-24 NOTE — Telephone Encounter (Signed)
Burnell Blanks, MD at 10/01/2014 10:37 AM     Status: Signed       Expand All Collapse All   Ok to refill. thanks            [place] at 10/01/2014 8:55 AM     Status: Signed       Expand All Collapse All   Please advise on refill.             Encounter MyChart Messages     No messages in this encounter     Approved      Disp Refills Start End    meclizine (ANTIVERT) 25 MG tablet 90 tablet 1 10/01/2014     Sig:  TAKE 1 TABLET BY MOUTH EVERY DAY AS NEEDED    Class:  Normal    DAW:  No    Comment:  PLEASE SEND NEW SCRIPT. [id number]    Authorizing Provider:  Burnell Blanks, MD    Please advise on refill. Pt has not been seen since 05/05/15.

## 2015-04-08 ENCOUNTER — Other Ambulatory Visit: Payer: Self-pay

## 2015-04-08 DIAGNOSIS — Z1231 Encounter for screening mammogram for malignant neoplasm of breast: Secondary | ICD-10-CM

## 2015-04-28 ENCOUNTER — Emergency Department (HOSPITAL_COMMUNITY): Payer: Medicare Other

## 2015-04-28 ENCOUNTER — Emergency Department (HOSPITAL_COMMUNITY)
Admission: EM | Admit: 2015-04-28 | Discharge: 2015-04-28 | Disposition: A | Discharge status: Home / Self Care | Payer: Medicare Other | Attending: Emergency Medicine | Admitting: Emergency Medicine

## 2015-04-28 ENCOUNTER — Encounter (HOSPITAL_COMMUNITY): Payer: Self-pay | Admitting: Cardiology

## 2015-04-28 DIAGNOSIS — Z872 Personal history of diseases of the skin and subcutaneous tissue: Secondary | ICD-10-CM | POA: Insufficient documentation

## 2015-04-28 DIAGNOSIS — F419 Anxiety disorder, unspecified: Secondary | ICD-10-CM | POA: Insufficient documentation

## 2015-04-28 DIAGNOSIS — R319 Hematuria, unspecified: Secondary | ICD-10-CM

## 2015-04-28 DIAGNOSIS — Z7982 Long term (current) use of aspirin: Secondary | ICD-10-CM | POA: Insufficient documentation

## 2015-04-28 DIAGNOSIS — K589 Irritable bowel syndrome without diarrhea: Secondary | ICD-10-CM | POA: Insufficient documentation

## 2015-04-28 DIAGNOSIS — Z8673 Personal history of transient ischemic attack (TIA), and cerebral infarction without residual deficits: Secondary | ICD-10-CM | POA: Insufficient documentation

## 2015-04-28 DIAGNOSIS — Z79899 Other long term (current) drug therapy: Secondary | ICD-10-CM | POA: Insufficient documentation

## 2015-04-28 DIAGNOSIS — Z8669 Personal history of other diseases of the nervous system and sense organs: Secondary | ICD-10-CM | POA: Insufficient documentation

## 2015-04-28 DIAGNOSIS — R109 Unspecified abdominal pain: Secondary | ICD-10-CM

## 2015-04-28 DIAGNOSIS — M810 Age-related osteoporosis without current pathological fracture: Secondary | ICD-10-CM | POA: Insufficient documentation

## 2015-04-28 DIAGNOSIS — I10 Essential (primary) hypertension: Secondary | ICD-10-CM | POA: Insufficient documentation

## 2015-04-28 DIAGNOSIS — Z8639 Personal history of other endocrine, nutritional and metabolic disease: Secondary | ICD-10-CM | POA: Insufficient documentation

## 2015-04-28 DIAGNOSIS — N201 Calculus of ureter: Secondary | ICD-10-CM | POA: Diagnosis not present

## 2015-04-28 DIAGNOSIS — Z880 Allergy status to penicillin: Secondary | ICD-10-CM | POA: Insufficient documentation

## 2015-04-28 DIAGNOSIS — Z86010 Personal history of colonic polyps: Secondary | ICD-10-CM | POA: Diagnosis not present

## 2015-04-28 DIAGNOSIS — K219 Gastro-esophageal reflux disease without esophagitis: Secondary | ICD-10-CM | POA: Diagnosis not present

## 2015-04-28 LAB — URINALYSIS, ROUTINE W REFLEX MICROSCOPIC
GLUCOSE, UA: NEGATIVE mg/dL
KETONES UR: 15 mg/dL — AB
NITRITE: NEGATIVE
PH: 6.5 (ref 5.0–8.0)
Protein, ur: 30 mg/dL — AB
SPECIFIC GRAVITY, URINE: 1.029 (ref 1.005–1.030)

## 2015-04-28 LAB — CBC
HEMATOCRIT: 44.1 % (ref 36.0–46.0)
HEMOGLOBIN: 14.7 g/dL (ref 12.0–15.0)
MCH: 30.4 pg (ref 26.0–34.0)
MCHC: 33.3 g/dL (ref 30.0–36.0)
MCV: 91.1 fL (ref 78.0–100.0)
Platelets: 251 10*3/uL (ref 150–400)
RBC: 4.84 MIL/uL (ref 3.87–5.11)
RDW: 12.7 % (ref 11.5–15.5)
WBC: 10 10*3/uL (ref 4.0–10.5)

## 2015-04-28 LAB — COMPREHENSIVE METABOLIC PANEL
ALT: 26 U/L (ref 14–54)
ANION GAP: 11 (ref 5–15)
AST: 26 U/L (ref 15–41)
Albumin: 3.9 g/dL (ref 3.5–5.0)
Alkaline Phosphatase: 61 U/L (ref 38–126)
BILIRUBIN TOTAL: 0.9 mg/dL (ref 0.3–1.2)
BUN: 13 mg/dL (ref 6–20)
CHLORIDE: 106 mmol/L (ref 101–111)
CO2: 25 mmol/L (ref 22–32)
Calcium: 9.2 mg/dL (ref 8.9–10.3)
Creatinine, Ser: 1.1 mg/dL — ABNORMAL HIGH (ref 0.44–1.00)
GFR, EST AFRICAN AMERICAN: 55 mL/min — AB (ref >60)
GFR, EST NON AFRICAN AMERICAN: 48 mL/min — AB (ref >60)
Glucose, Bld: 137 mg/dL — ABNORMAL HIGH (ref 65–99)
POTASSIUM: 3.6 mmol/L (ref 3.5–5.1)
Sodium: 142 mmol/L (ref 135–145)
TOTAL PROTEIN: 7 g/dL (ref 6.5–8.1)

## 2015-04-28 LAB — URINE MICROSCOPIC-ADD ON

## 2015-04-28 MED ORDER — ONDANSETRON 4 MG PO TBDP
4.0000 mg | ORAL_TABLET | Freq: Once | ORAL | Status: AC | PRN
  Administered 2015-04-28: 4 mg via ORAL

## 2015-04-28 MED ORDER — ONDANSETRON 4 MG PO TBDP
ORAL_TABLET | ORAL | Status: AC
  Filled 2015-04-28: qty 1

## 2015-04-28 MED ORDER — ONDANSETRON 4 MG PO TBDP: ORAL_TABLET | ORAL | Status: DC

## 2015-04-28 MED ORDER — OXYCODONE-ACETAMINOPHEN 5-325 MG PO TABS
1.0000 | ORAL_TABLET | Freq: Once | ORAL | Status: AC
  Administered 2015-04-28: 1 via ORAL

## 2015-04-28 MED ORDER — OXYCODONE HCL 5 MG PO TABS: 5.0000 mg | ORAL_TABLET | Freq: Four times a day (QID) | ORAL | Status: DC | PRN

## 2015-04-28 MED ORDER — TAMSULOSIN HCL 0.4 MG PO CAPS: 0.4000 mg | ORAL_CAPSULE | Freq: Two times a day (BID) | ORAL | Status: DC

## 2015-04-28 MED ORDER — OXYCODONE-ACETAMINOPHEN 5-325 MG PO TABS
ORAL_TABLET | ORAL | Status: AC
  Filled 2015-04-28: qty 1

## 2015-04-28 NOTE — ED Notes (Signed)
Pt ambulated to the restroom with standby assistance. Pt tolerated well. 

## 2015-04-28 NOTE — ED Notes (Signed)
Pt reports she woke up with left flank pain that started about 630am. Was sent over from PCP office to rule out kidney stone. Reports nausea/vomiting.

## 2015-04-28 NOTE — Discharge Instructions (Signed)
1. Medications: Flomax, roxicodone, zofran, usual home medications 2. Treatment: rest, drink plenty of fluids, take tylenol 1000mg  as needed for pain before taking roxicodone (you may take up to 4000mg  of tylenol in 24 hours) 3. Follow Up: Please call Dr. Arlyn Leak office tomorrow morning at 8:30 to schedule an appointment for tomorrow; Please return to the ER for fever, persistent vomiting, worsening pain or other concerns    Kidney Stones Kidney stones (urolithiasis) are deposits that form inside your kidneys. The intense pain is caused by the stone moving through the urinary tract. When the stone moves, the ureter goes into spasm around the stone. The stone is usually passed in the urine.  CAUSES   A disorder that makes certain neck glands produce too much parathyroid hormone (primary hyperparathyroidism).  A buildup of uric acid crystals, similar to gout in your joints.  Narrowing (stricture) of the ureter.  A kidney obstruction present at birth (congenital obstruction).  Previous surgery on the kidney or ureters.  Numerous kidney infections. SYMPTOMS   Feeling sick to your stomach (nauseous).  Throwing up (vomiting).  Blood in the urine (hematuria).  Pain that usually spreads (radiates) to the groin.  Frequency or urgency of urination. DIAGNOSIS   Taking a history and physical exam.  Blood or urine tests.  CT scan.  Occasionally, an examination of the inside of the urinary bladder (cystoscopy) is performed. TREATMENT   Observation.  Increasing your fluid intake.  Extracorporeal shock wave lithotripsy--This is a noninvasive procedure that uses shock waves to break up kidney stones.  Surgery may be needed if you have severe pain or persistent obstruction. There are various surgical procedures. Most of the procedures are performed with the use of small instruments. Only small incisions are needed to accommodate these instruments, so recovery time is  minimized. The size, location, and chemical composition are all important variables that will determine the proper choice of action for you. Talk to your health care provider to better understand your situation so that you will minimize the risk of injury to yourself and your kidney.  HOME CARE INSTRUCTIONS   Drink enough water and fluids to keep your urine clear or pale yellow. This will help you to pass the stone or stone fragments.  Strain all urine through the provided strainer. Keep all particulate matter and stones for your health care provider to see. The stone causing the pain may be as small as a grain of salt. It is very important to use the strainer each and every time you pass your urine. The collection of your stone will allow your health care provider to analyze it and verify that a stone has actually passed. The stone analysis will often identify what you can do to reduce the incidence of recurrences.  Only take over-the-counter or prescription medicines for pain, discomfort, or fever as directed by your health care provider.  Keep all follow-up visits as told by your health care provider. This is important.  Get follow-up X-rays if required. The absence of pain does not always mean that the stone has passed. It may have only stopped moving. If the urine remains completely obstructed, it can cause loss of kidney function or even complete destruction of the kidney. It is your responsibility to make sure X-rays and follow-ups are completed. Ultrasounds of the kidney can show blockages and the status of the kidney. Ultrasounds are not associated with any radiation and can be performed easily in a matter of minutes.  Make changes to  your daily diet as told by your health care provider. You may be told to:  Limit the amount of salt that you eat.  Eat 5 or more servings of fruits and vegetables each day.  Limit the amount of meat, poultry, fish, and eggs that you eat.  Collect a  24-hour urine sample as told by your health care provider.You may need to collect another urine sample every 6-12 months. SEEK MEDICAL CARE IF:  You experience pain that is progressive and unresponsive to any pain medicine you have been prescribed. SEEK IMMEDIATE MEDICAL CARE IF:   Pain cannot be controlled with the prescribed medicine.  You have a fever or shaking chills.  The severity or intensity of pain increases over 18 hours and is not relieved by pain medicine.  You develop a new onset of abdominal pain.  You feel faint or pass out.  You are unable to urinate.   This information is not intended to replace advice given to you by your health care provider. Make sure you discuss any questions you have with your health care provider.   Document Released: 02/07/2005 Document Revised: 10/29/2014 Document Reviewed: 07/11/2012 Elsevier Interactive Patient Education [employer].

## 2015-04-28 NOTE — ED Provider Notes (Signed)
CSN: [id number]     Arrival date & time 04/28/15  1308 History   First MD Initiated Contact with Patient 04/28/15 1942     Chief Complaint  Patient presents with  . Flank Pain     (Consider location/radiation/quality/duration/timing/severity/associated sxs/prior Treatment) The history is provided by the patient and medical records. No language interpreter was used.     [name] is a 75 y.o. female  with a hx of TIA, anxiety, PUD, diverticulitis, HTN, thyroid disease,. presents to the Emergency Department complaining of gradual, persistent, progressively worsening Left flank pain onset 6:30am and again at 9am.  Pt reports pain is 10/10 and was given percocet which decreased the stabbing pain to 1/10.  Pt reports she had a UA at her PCP which showed 3+ hgb.  Pt reports "coke colored urine"  X 1 2 weeks ago.  Her PCP called in Cipro which she completed.  No known aggravating or alleviating factors.  Pt denies fever, chills, headache, neck pain, chest pain, SOB, abd pain, N/V/D, weakness, dizziness, syncope.  Pt also denies urinary frequency, urgency and dysuria.      Stress test: ETT-Myoview (8/15): Poor exercise capacity, no ischemia, EF 61%-normal study   Past Medical History  Diagnosis Date  . TIA (transient ischemic attack)   . Osteoporosis   . Anxiety   . Sleep disorder   . PUD (peptic ulcer disease)   . Appendicitis   . Hx of adenomatous colonic polyps   . Diverticulitis   . GERD (gastroesophageal reflux disease)   . IBS (irritable bowel syndrome)   . Fibromyalgia   . Hypertension   . Thyroid disease   . Back abscess   . PVC (premature ventricular contraction)   . Hx of cardiovascular stress test     ETT-Myoview (8/15): Poor exercise capacity, no ischemia, EF 61%-normal study   Past Surgical History  Procedure Laterality Date  . Appendectomy    . Wrist surgery      left  . Carpal tunnel release      left  . Abdominal hysterectomy     Family History   Problem Relation Age of Onset  . Heart attack Maternal Grandfather 72  . Glaucoma Mother   . Macular degeneration Mother   . Lung cancer Father   . Lung cancer Brother   . Tuberculosis Paternal Grandfather   . Colon cancer Paternal Grandmother 73  . Heart attack Father   . Cancer Father   . Stroke Mother   . Hypertension Mother   . Hypertension Brother   . Diabetes Brother   . Cancer Brother   . Cancer    . Cancer Paternal Grandmother    Social History  Substance Use Topics  . Smoking status: Never Smoker   . Smokeless tobacco: Never Used  . Alcohol Use: No   OB History    No data available     Review of Systems  Constitutional: Negative for fever, diaphoresis, appetite change, fatigue and unexpected weight change.  HENT: Negative for mouth sores.   Eyes: Negative for visual disturbance.  Respiratory: Negative for cough, chest tightness, shortness of breath and wheezing.   Cardiovascular: Negative for chest pain.  Gastrointestinal: Negative for nausea, vomiting, abdominal pain, diarrhea and constipation.  Endocrine: Negative for polydipsia, polyphagia and polyuria.  Genitourinary: Positive for hematuria (resolved) and flank pain. Negative for dysuria, urgency and frequency.  Musculoskeletal: Negative for back pain and neck stiffness.  Skin: Negative for rash.  Allergic/Immunologic: Negative for  immunocompromised state.  Neurological: Negative for syncope, light-headedness and headaches.  Hematological: Does not bruise/bleed easily.  Psychiatric/Behavioral: Negative for sleep disturbance. The patient is not nervous/anxious.       Allergies  Penicillins; Sulfonamide derivatives; Iodinated diagnostic agents; Metronidazole; and Prednisone  Home Medications   Prior to Admission medications   Medication Sig Start Date End Date Taking? Authorizing Provider  acetaminophen (TYLENOL) 500 MG tablet Take 500 mg by mouth 2 (two) times daily as needed (pain).   Yes  Historical Provider, MD  alum & mag hydroxide-simeth (MAALOX PLUS) 400-400-40 MG/5ML suspension Take 5 mLs by mouth every 6 (six) hours as needed for indigestion.   Yes Historical Provider, MD  aspirin EC 81 MG tablet Take 1 tablet (81 mg total) by mouth daily. 12/18/12  Yes Burnell Blanks, MD  calcium carbonate (TUMS - DOSED IN MG ELEMENTAL CALCIUM) 500 MG chewable tablet Chew 1 tablet by mouth daily as needed for indigestion or heartburn.   Yes Historical Provider, MD  desoximetasone (TOPICORT) 0.25 % cream Apply 1 application topically 2 (two) times daily as needed (lichen planus on legs).    Yes Historical Provider, MD  dicyclomine (BENTYL) 10 MG capsule Take 10 mg by mouth daily as needed for spasms (colon spasms).  07/26/11  Yes Lafayette Dragon, MD  diltiazem (CARDIZEM CD) 120 MG 24 hr capsule TAKE 1 CAPSULE (120 MG TOTAL) BY MOUTH DAILY. 09/08/14  Yes Burnell Blanks, MD  ENSURE PLUS (ENSURE PLUS) LIQD Take 237 mLs by mouth daily.   Yes Historical Provider, MD  ergocalciferol (VITAMIN D2) 50000 UNITS capsule Take 50,000 Units by mouth See admin instructions. Take 1 capsule (50,000 units) every Saturday and an additional capsule on the last Monday of each month   Yes Historical Provider, MD  escitalopram (LEXAPRO) 10 MG tablet Take 10 mg by mouth daily. Take every day per patient   Yes Historical Provider, MD  esomeprazole (NEXIUM) 20 MG capsule Take 20 mg by mouth at bedtime.   Yes Historical Provider, MD  ibuprofen (ADVIL,MOTRIN) 200 MG tablet Take 200 mg by mouth daily as needed (pain).   Yes Historical Provider, MD  meclizine (ANTIVERT) 25 MG tablet TAKE 1 TABLET BY MOUTH EVERY DAY AS NEEDED 03/24/15  Yes Burnell Blanks, MD  ondansetron (ZOFRAN ODT) 4 MG disintegrating tablet 4mg  ODT q4 hours prn nausea/vomit 04/28/15   Karthika Glasper, PA-C  oxyCODONE (ROXICODONE) 5 MG immediate release tablet Take 1 tablet (5 mg total) by mouth every 6 (six) hours as needed for severe  pain. 04/28/15   Benjamine Strout, PA-C  tamsulosin (FLOMAX) 0.4 MG CAPS capsule Take 1 capsule (0.4 mg total) by mouth 2 (two) times daily. 04/28/15   Rochelle Larue, PA-C   BP 124/63 mmHg  Pulse 83  Temp(Src) 98.1 F (36.7 C) (Oral)  Resp 18  Wt 68.493 kg  SpO2 95% Physical Exam  Constitutional: She appears well-developed and well-nourished. No distress.  Awake, alert, nontoxic appearance  HENT:  Head: Normocephalic and atraumatic.  Mouth/Throat: Oropharynx is clear and moist. No oropharyngeal exudate.  Eyes: Conjunctivae are normal. No scleral icterus.  Neck: Normal range of motion. Neck supple.  Cardiovascular: Normal rate, regular rhythm, normal heart sounds and intact distal pulses.   Pulmonary/Chest: Effort normal and breath sounds normal. No respiratory distress. She has no wheezes.  Equal chest expansion  Abdominal: Soft. Bowel sounds are normal. She exhibits no mass. There is no tenderness. There is no rebound, no guarding and no CVA  tenderness.  Musculoskeletal: Normal range of motion. She exhibits no edema.  Neurological: She is alert.  Speech is clear and goal oriented Moves extremities without ataxia  Skin: Skin is warm and dry. She is not diaphoretic.  Psychiatric: She has a normal mood and affect.  Nursing note and vitals reviewed.   ED Course  Procedures (including critical care time) Labs Review Labs Reviewed  COMPREHENSIVE METABOLIC PANEL - Abnormal; Notable for the following:    Glucose, Bld 137 (*)    Creatinine, Ser 1.10 (*)    GFR calc non Af Amer 48 (*)    GFR calc Af Amer 55 (*)    All other components within normal limits  URINALYSIS, ROUTINE W REFLEX MICROSCOPIC (NOT AT [facility]) - Abnormal; Notable for the following:    Color, Urine RED (*)    APPearance CLOUDY (*)    Hgb urine dipstick LARGE (*)    Bilirubin Urine SMALL (*)    Ketones, ur 15 (*)    Protein, ur 30 (*)    Leukocytes, UA SMALL (*)    All other components within normal limits   URINE MICROSCOPIC-ADD ON - Abnormal; Notable for the following:    Squamous Epithelial / LPF 0-5 (*)    Bacteria, UA RARE (*)    All other components within normal limits  CBC    Imaging Review Ct Renal Stone Study  04/28/2015  CLINICAL DATA:  Left flank pain. EXAM: CT ABDOMEN AND PELVIS WITHOUT CONTRAST TECHNIQUE: Multidetector CT imaging of the abdomen and pelvis was performed following the standard protocol without IV contrast. COMPARISON:  CT abdomen pelvis 07/05/2013 FINDINGS: Lower chest: Linear scarring in both lung bases unchanged. No infiltrate or effusion Hepatobiliary: Liver normal in size and contour. Gallbladder and bile ducts normal Pancreas: Negative Spleen: Negative Adrenals/Urinary Tract: Left renal obstruction. Left hydronephrosis superior renal stranding. 4 x 5 mm stone proximal left ureter at the L3-4 level. No other renal calculi. Normal right kidney. Urinary bladder normal. Stomach/Bowel: Colonic diverticulosis without diverticulitis. No bowel thickening or mass. History of appendectomy. Vascular/Lymphatic: Minimal atherosclerotic calcification in the aorta without aneurysm. No lymphadenopathy. Reproductive: Hysterectomy changes.  No pelvic mass. Other: No free-fluid Musculoskeletal: Negative IMPRESSION: 4 x 5 mm obstructing stone proximal left ureter. Colonic diverticulosis.  No other acute abnormality. Electronically Signed   By: Franchot Gallo M.D.   On: 04/28/2015 21:22   I have personally reviewed and evaluated these images and lab results as part of my medical decision-making.    MDM   Final diagnoses:  Left ureteral stone  Left flank pain  Hematuria   Hildred Laser presents with L flank pain since this morning. Patient given Percocet in the waiting room with complete resolution of pain. She reports she feels well this time.  Slight elevation in her serum creatinine from 0.8-1.1 today. Hemoglobin in her urine but no evidence of infection.  CT scan shows a 4  x 5 mm obstructing left proximal ureteral stone.  Discussed with Dr. Gaynelle Arabian who recommends pain control, Flomax and he will evaluate in the office tomorrow morning.  Patient is well-appearing, tolerating by mouth without difficulty. Her pain is controlled.    The patient was discussed with and seen by Dr. Winfred Leeds who agrees with the treatment plan.   Jarrett Soho Arnelle Nale, PA-C 04/28/15 [place], MD 04/29/15 [phone]

## 2015-04-28 NOTE — ED Notes (Signed)
Patient verbalized understanding of discharge instructions and denies any further needs or questions at this time. VS stable. Patient ambulatory with steady gait.  

## 2015-04-28 NOTE — ED Notes (Signed)
Patient transported to CT 

## 2015-04-28 NOTE — ED Notes (Signed)
PA-C at bedside 

## 2015-04-28 NOTE — ED Provider Notes (Signed)
Plan of left flank pain onset this morning. She is presently pain-free since treatment in the emergency department. No other associated symptoms.   Orlie Dakin, MD 04/28/15 2222

## 2015-05-04 ENCOUNTER — Other Ambulatory Visit: Payer: Self-pay | Admitting: Urology

## 2015-05-04 ENCOUNTER — Telehealth: Payer: Self-pay | Admitting: Cardiovascular Disease

## 2015-05-04 NOTE — Telephone Encounter (Signed)
Fax number confirmed and message left on Chasity's voicemail that I would be faxing her this phone note.

## 2015-05-04 NOTE — Telephone Encounter (Signed)
I spoke to Dr. Gaynelle Arabian today by phone about her procedure. She has a history of PVCs only. She can hold ASA for 7 days before the planned procedure. She does not need a formal visit for cardiac clearance.   Landon Truax 05/04/2015 4:18 PM

## 2015-05-04 NOTE — Telephone Encounter (Signed)
New message      Request for surgical clearance:  What type of surgery is being performed?  Kidney stone When is this surgery scheduled? 05-08-15 Are there any medications that need to be held prior to surgery and how long? Hold aspirin today and need cardiac clearance 1. Name of physician performing surgery?  Dr Nelda Severe  2. What is your office phone and fax number? Fax [phone]

## 2015-05-05 NOTE — Patient Instructions (Addendum)
AMBERA CONK  05/05/2015   Your procedure is scheduled on: 05/08/2015    Report to [facility]  Entrance take [place]  elevators to 3rd floor to  [place] at    [place] AM.  Call this number if you have problems the morning of surgery [phone]   Remember: ONLY 1 PERSON MAY GO WITH YOU TO SHORT STAY TO GET  READY MORNING OF [place].   Do not eat food or drink liquids :After Midnight.     Take these medicines the morning of surgery with A SIP OF WATER: Diltiazem ( Cardiazem), Lexapro, Oxycodone if needed,                                  You may not have any metal on your body including hair pins and              piercings  Do not wear jewelry, make-up, lotions, powders or perfumes, deodorant             Do not wear nail polish.  Do not shave  48 hours prior to surgery.                Do not bring valuables to the hospital. [place].  Contacts, dentures or bridgework may not be worn into surgery.      Patients discharged the day of surgery will not be allowed to drive home.  Name and phone number of your driver: Brother- Ronalee Belts  Special Instructions: coughing and deep breathing exercises, leg exercises               Please read over the following fact sheets you were given: _____________________________________________________________________             [facility] - Preparing for Surgery Before surgery, you can play an important role.  Because skin is not sterile, your skin needs to be as free of germs as possible.  You can reduce the number of germs on your skin by washing with CHG (chlorahexidine gluconate) soap before surgery.  CHG is an antiseptic cleaner which kills germs and bonds with the skin to continue killing germs even after washing. Please DO NOT use if you have an allergy to CHG or antibacterial soaps.  If your skin becomes reddened/irritated stop using the CHG and  inform your nurse when you arrive at Short Stay. Do not shave (including legs and underarms) for at least 48 hours prior to the first CHG shower.  You may shave your face/neck. Please follow these instructions carefully:  1.  Shower with CHG Soap the night before surgery and the  morning of Surgery.  2.  If you choose to wash your hair, wash your hair first as usual with your  normal  shampoo.  3.  After you shampoo, rinse your hair and body thoroughly to remove the  shampoo.                           4.  Use CHG as you would any other liquid soap.  You can apply chg directly  to the skin and wash  Gently with a scrungie or clean washcloth.  5.  Apply the CHG Soap to your body ONLY FROM THE NECK DOWN.   Do not use on face/ open                           Wound or open sores. Avoid contact with eyes, ears mouth and genitals (private parts).                       Wash face,  Genitals (private parts) with your normal soap.             6.  Wash thoroughly, paying special attention to the area where your surgery  will be performed.  7.  Thoroughly rinse your body with warm water from the neck down.  8.  DO NOT shower/wash with your normal soap after using and rinsing off  the CHG Soap.                9.  Pat yourself dry with a clean towel.            10.  Wear clean pajamas.            11.  Place clean sheets on your bed the night of your first shower and do not  sleep with pets. Day of Surgery : Do not apply any lotions/deodorants the morning of surgery.  Please wear clean clothes to the hospital/surgery center.  FAILURE TO FOLLOW THESE INSTRUCTIONS MAY RESULT IN THE CANCELLATION OF YOUR SURGERY PATIENT SIGNATURE_________________________________  NURSE SIGNATURE__________________________________  ________________________________________________________________________

## 2015-05-06 ENCOUNTER — Encounter (HOSPITAL_COMMUNITY): Payer: Self-pay

## 2015-05-06 ENCOUNTER — Encounter (HOSPITAL_COMMUNITY)
Admission: RE | Admit: 2015-05-06 | Discharge: 2015-05-06 | Disposition: A | Discharge status: Home / Self Care | Payer: Medicare Other | Source: Ambulatory Visit | Attending: Urology | Admitting: Urology

## 2015-05-06 DIAGNOSIS — N201 Calculus of ureter: Secondary | ICD-10-CM | POA: Diagnosis not present

## 2015-05-06 DIAGNOSIS — K219 Gastro-esophageal reflux disease without esophagitis: Secondary | ICD-10-CM | POA: Diagnosis not present

## 2015-05-06 DIAGNOSIS — Z79891 Long term (current) use of opiate analgesic: Secondary | ICD-10-CM | POA: Diagnosis not present

## 2015-05-06 DIAGNOSIS — M1990 Unspecified osteoarthritis, unspecified site: Secondary | ICD-10-CM | POA: Diagnosis not present

## 2015-05-06 DIAGNOSIS — Z8673 Personal history of transient ischemic attack (TIA), and cerebral infarction without residual deficits: Secondary | ICD-10-CM | POA: Diagnosis not present

## 2015-05-06 DIAGNOSIS — E7800 Pure hypercholesterolemia, unspecified: Secondary | ICD-10-CM | POA: Diagnosis not present

## 2015-05-06 DIAGNOSIS — Z7982 Long term (current) use of aspirin: Secondary | ICD-10-CM | POA: Diagnosis not present

## 2015-05-06 DIAGNOSIS — Z791 Long term (current) use of non-steroidal anti-inflammatories (NSAID): Secondary | ICD-10-CM | POA: Diagnosis not present

## 2015-05-06 DIAGNOSIS — I493 Ventricular premature depolarization: Secondary | ICD-10-CM | POA: Diagnosis not present

## 2015-05-06 DIAGNOSIS — I10 Essential (primary) hypertension: Secondary | ICD-10-CM | POA: Diagnosis not present

## 2015-05-06 DIAGNOSIS — Z79899 Other long term (current) drug therapy: Secondary | ICD-10-CM | POA: Diagnosis not present

## 2015-05-06 HISTORY — DX: Personal history of urinary calculi: Z87.442

## 2015-05-07 NOTE — H&P (Signed)
Active Problems Problems  1. Gross hematuria (R31.0) 2. Left ureteral stone (N20.1) 3. Microscopic hematuria (R31.29) 4. Renal colic on left side (Q000111Q)  History of Present Illness       ` 75 yo female seen today in follow-up of a kidney stone, found on ER visit of 04/28/15 for Lt flank pain. She originally noted gross hematuria 2 weeks ago, and was treated for hemorrhagic cystitis with cipro for 5 days. She subsequently developed severe L flank pain last Tuesday, and gross hematuria. CT stone protocol ( Allergy IV contrast) at [facility] showed a 4 x 5 mm obstructing Lt proximal ureteral stone.     She has been treated with tamsulosin, oxycodone/Zofran 4mg . She was feeling well until Sunday ,when she developed a second episode of nausea and severe L flank pain. She is seen today for KUB after bowel clean-out to see if I can see the stone, and if lithotripsy is possible.   Past Medical History Problems  1. History of Anxiety (F41.9) 2. History of arthritis (Z87.39) 3. History of cardiac disorder (Z86.79) 4. History of cardiac murmur (Z86.79) 5. History of diverticulitis of colon (Z87.19) 6. History of esophageal reflux (Z87.19) 7. History of fibromyalgia (Z87.39) 8. History of hypercholesterolemia (Z86.39) 9. History of hypertension (Z86.79) 10. History of osteoporosis (Z87.39) 11. History of peptic ulcer (Z87.11) 12. History of thyroid disease (Z86.39) 13. History of transient cerebral ischemia (Z86.73) 14. History of PVC (premature ventricular contraction) (I49.3)  Surgical History Problems  1. History of Appendectomy 2. History of Neuroplasty Median Nerve At Carpal Tunnel 3. History of Total Abdominal Hysterectomy 4. History of Wrist Surgery Left  Current Meds 1. Acetaminophen 500 MG Oral Tablet;  Therapy: (Recorded:10Mar2017) to Recorded 2. Aspirin 81 MG TABS;  Therapy: (Recorded:10Mar2017) to Recorded 3. Desoximetasone CREA;  Therapy: (Recorded:10Mar2017) to  Recorded 4. Dicyclomine HCl - 10 MG Oral Capsule;  Therapy: (Recorded:10Mar2017) to Recorded 5. DiltiaZEM HCl - 120 MG Oral Tablet;  Therapy: (Recorded:10Mar2017) to Recorded 6. Ensure Plus LIQD;  Therapy: (Recorded:10Mar2017) to Recorded 7. Escitalopram Oxalate 10 MG Oral Tablet;  Therapy: (Recorded:10Mar2017) to Recorded 8. Esomeprazole Magnesium 20 MG Oral Capsule Delayed Release;  Therapy: (Recorded:10Mar2017) to Recorded 9. Ibuprofen 200 MG Oral Capsule;  Therapy: (Recorded:10Mar2017) to Recorded 10. Maalox Max SUSP;   Therapy: (Recorded:10Mar2017) to Recorded 11. Meclizine HCl - 25 MG Oral Tablet;   Therapy: (Recorded:10Mar2017) to Recorded 12. Ondansetron 4 MG Oral Tablet Dispersible;   Therapy: (Recorded:10Mar2017) to Recorded 13. OxyCODONE HCl - 5 MG Oral Tablet;   Therapy: (Recorded:10Mar2017) to Recorded 14. Pravastatin Sodium 20 MG Oral Tablet;   Therapy: (Recorded:10Mar2017) to Recorded 15. Tamsulosin HCl - 0.4 MG Oral Capsule;   Therapy: (Recorded:10Mar2017) to Recorded 16. Tums CHEW;   Therapy: (Recorded:10Mar2017) to Recorded  Allergies Medication  1. Penicillins 2. Sulfa Drugs 3. Iodine SOLN 4. MetroNIDAZOLE CAPS 5. PredniSONE (Pak) TABS Non-Medication  6. Contrast Dye  Family History Problems  1. Family history of Deceased : Mother, Father 2. Family history of acute myocardial infarction (Z82.49) : Father, Maternal Grandfather 3. Family history of colon cancer (Z80.0) : Paternal Grandfather 58. Family history of dementia (Z81.8) : Mother 5. Family history of diabetes mellitus (Z83.3) : Brother 18. Family history of glaucoma (Z83.511) : Mother 7. Family history of hypertension (Z82.49) : Mother, Brother 58. Family history of lung cancer (Z80.1) : Father, Brother 23. Family history of macular degeneration [id number]) : Mother 60. Family history of malignant neoplasm (Z80.9) : Paternal Grandmother 41. Family history of stroke (  Z82.3) : Mother 27. Family  history of tuberculosis (Z31.1) : Paternal Grandfather  Social History Problems  1. Has no children 2. Never a smoker 3. No alcohol use 4. No caffeine use 5. Retired 53. Widowed (Z63.4)  Review of Systems Genitourinary, eye, otolaryngeal, hematologic/lymphatic, cardiovascular, pulmonary, endocrine, musculoskeletal, neurological and psychiatric system(s) were reviewed and pertinent findings if present are noted and are otherwise negative.  Genitourinary: hematuria.  Gastrointestinal: nausea, vomiting, flank pain and abdominal pain, but no heartburn, no diarrhea, no constipation and no melena.  Constitutional: feeling poorly (malaise) and feeling tired (fatigue), but no fever and no night sweats.    Physical Exam Constitutional: Well nourished and well developed . No acute distress.  ENT:. The ears and nose are normal in appearance.  Neck: The appearance of the neck is normal and no neck mass is present.  Cardiovascular: Heart rate and rhythm are normal . No peripheral edema.  Abdomen: The abdomen is mildly obese, but not distended. The abdomen is soft and nontender. No masses are palpated. Mild tenderness in the LLQ is present. mild left CVA tenderness. Bowel sounds are normal. No hernias are palpable. No hepatosplenomegaly noted.  Lymphatics: The femoral and inguinal nodes are not enlarged or tender.  Skin: Normal skin turgor, no visible rash and no visible skin lesions.  Neuro/Psych:. Mood and affect are appropriate.    Results/Data Selected Results  KUB [id number] 11:27AM Carolan Clines   Test Name Result Flag Reference  KUB     Diagnostic Images Are Available In PACS For This Exam.    KUB: KUB today is compared with KUB from CT scan. The 4.5 mm left midureteral calculus is not seen on original KUB. Today, after catharsis, I still cannot find the stone on KUB. The patient has some stool overlying the left lower ureter and bladder area and however, the left mid ureter is clear  of overlying stool. The bowel was normal. The bone is normal. Lungs are normal that can be seen.  Assessment Assessed  1. Left ureteral stone (N20.1) 2. Gross hematuria (R31.0) 3. Renal colic on left side (Q000111Q) 4. History of cardiac disorder (Z86.79) 5. History of PVC (premature ventricular contraction) (I49.3)  Recurrent renal colic (left). The patient has a known 4.5 mm left ureteral stone. She has not passed the stone, although she has been straining. She is taken 5 of her Percocet. She still has 10 for visit left. She will be given a prescription for Flomax, because she is out of the medication. She will also be given a prescription for her nausea medication, because she is out of that as well. In addition, she will be placed on the OR schedule for cystoscopy and basket extraction of her stone. I discussed the case with Dr. Standley Brooking, and he notes the patient was last seen 1 year ago, with occasional PVCs. She is cleared for anesthesia. She takes aspirin, and this may be stopped prior to surgery.   Plan Place on or schedule as soon as possible for basket extraction of stone.   Discussion/Summary cc: Dr. Kathleen Lime     Signatures Electronically signed by : Carolan Clines, M.D.; May 04 2015  1:30PM EST

## 2015-05-07 NOTE — Anesthesia Preprocedure Evaluation (Signed)
Anesthesia Evaluation  Patient identified by MRN, date of birth, ID band Patient awake    Reviewed: Allergy & Precautions, NPO status , Patient's Chart, lab work & pertinent test results  History of Anesthesia Complications Negative for: history of anesthetic complications  Airway Mallampati: II  TM Distance: >3 FB Neck ROM: Full    Dental no notable dental hx. (+) Dental Advisory Given   Pulmonary neg pulmonary ROS,    Pulmonary exam normal breath sounds clear to auscultation       Cardiovascular hypertension, Pt. on medications Normal cardiovascular exam Rhythm:Regular Rate:Normal  Echo 2016: Study Conclusions  - Left ventricle: The cavity size was normal. Systolic function was normal. The estimated ejection fraction was in the range of 60% to 65%. Wall motion was normal; there were no regional wall motion abnormalities. Doppler parameters are consistent with abnormal left ventricular relaxation (grade 1 diastolic dysfunction). - Aortic valve: There was mild regurgitation. - Mitral valve: There was mild regurgitation.  Impressions:  - Compared to the prior study, there has been no significant interval change.    Neuro/Psych PSYCHIATRIC DISORDERS Anxiety TIA   GI/Hepatic Neg liver ROS, GERD  Medicated and Controlled,  Endo/Other  negative endocrine ROS  Renal/GU negative Renal ROS  negative genitourinary   Musculoskeletal  (+) Fibromyalgia -  Abdominal   Peds negative pediatric ROS (+)  Hematology negative hematology ROS (+)   Anesthesia Other Findings   Reproductive/Obstetrics negative OB ROS                             Anesthesia Physical Anesthesia Plan  ASA: II  Anesthesia Plan: General   Post-op Pain Management:    Induction: Intravenous  Airway Management Planned: LMA  Additional Equipment:   Intra-op Plan:   Post-operative Plan: Extubation in  OR  Informed Consent: I have reviewed the patients History and Physical, chart, labs and discussed the procedure including the risks, benefits and alternatives for the proposed anesthesia with the patient or authorized representative who has indicated his/her understanding and acceptance.   Dental advisory given  Plan Discussed with: CRNA  Anesthesia Plan Comments:         Anesthesia Quick Evaluation

## 2015-05-08 ENCOUNTER — Ambulatory Visit (HOSPITAL_COMMUNITY)
Admission: RE | Admit: 2015-05-08 | Discharge: 2015-05-08 | Disposition: A | Discharge status: Home / Self Care | Payer: Medicare Other | Source: Ambulatory Visit | Attending: Urology | Admitting: Urology

## 2015-05-08 ENCOUNTER — Encounter (HOSPITAL_COMMUNITY): Payer: Self-pay | Admitting: *Deleted

## 2015-05-08 ENCOUNTER — Ambulatory Visit (HOSPITAL_COMMUNITY): Payer: Medicare Other | Admitting: Anesthesiology

## 2015-05-08 ENCOUNTER — Encounter (HOSPITAL_COMMUNITY)
Admission: RE | Disposition: A | Discharge status: Home / Self Care | Payer: Self-pay | Source: Ambulatory Visit | Attending: Urology

## 2015-05-08 DIAGNOSIS — E7800 Pure hypercholesterolemia, unspecified: Secondary | ICD-10-CM | POA: Insufficient documentation

## 2015-05-08 DIAGNOSIS — M1990 Unspecified osteoarthritis, unspecified site: Secondary | ICD-10-CM | POA: Diagnosis not present

## 2015-05-08 DIAGNOSIS — N201 Calculus of ureter: Secondary | ICD-10-CM

## 2015-05-08 DIAGNOSIS — I10 Essential (primary) hypertension: Secondary | ICD-10-CM | POA: Diagnosis not present

## 2015-05-08 DIAGNOSIS — K219 Gastro-esophageal reflux disease without esophagitis: Secondary | ICD-10-CM | POA: Insufficient documentation

## 2015-05-08 DIAGNOSIS — Z7982 Long term (current) use of aspirin: Secondary | ICD-10-CM | POA: Insufficient documentation

## 2015-05-08 DIAGNOSIS — Z791 Long term (current) use of non-steroidal anti-inflammatories (NSAID): Secondary | ICD-10-CM | POA: Insufficient documentation

## 2015-05-08 DIAGNOSIS — Z8673 Personal history of transient ischemic attack (TIA), and cerebral infarction without residual deficits: Secondary | ICD-10-CM | POA: Diagnosis not present

## 2015-05-08 DIAGNOSIS — I493 Ventricular premature depolarization: Secondary | ICD-10-CM | POA: Insufficient documentation

## 2015-05-08 DIAGNOSIS — Z79891 Long term (current) use of opiate analgesic: Secondary | ICD-10-CM | POA: Insufficient documentation

## 2015-05-08 DIAGNOSIS — Z79899 Other long term (current) drug therapy: Secondary | ICD-10-CM | POA: Insufficient documentation

## 2015-05-08 HISTORY — PX: CYSTOSCOPY/RETROGRADE/URETEROSCOPY/STONE EXTRACTION WITH BASKET: SHX5317

## 2015-05-08 SURGERY — CYSTOSCOPY, WITH CALCULUS REMOVAL USING BASKET
Anesthesia: General | Laterality: Left

## 2015-05-08 MED ORDER — SODIUM CHLORIDE 0.9 % IR SOLN
Status: DC | PRN
  Administered 2015-05-08: 2500 mL

## 2015-05-08 MED ORDER — FENTANYL CITRATE (PF) 100 MCG/2ML IJ SOLN
25.0000 ug | INTRAMUSCULAR | Status: DC | PRN
  Administered 2015-05-08 (×2): 50 ug via INTRAVENOUS

## 2015-05-08 MED ORDER — BELLADONNA ALKALOIDS-OPIUM 16.2-60 MG RE SUPP
RECTAL | Status: DC | PRN
  Administered 2015-05-08: 1 via RECTAL

## 2015-05-08 MED ORDER — PROPOFOL 10 MG/ML IV BOLUS
INTRAVENOUS | Status: AC
  Filled 2015-05-08: qty 20

## 2015-05-08 MED ORDER — ONDANSETRON HCL 4 MG/2ML IJ SOLN
INTRAMUSCULAR | Status: AC
  Filled 2015-05-08: qty 2

## 2015-05-08 MED ORDER — CIPROFLOXACIN IN D5W 400 MG/200ML IV SOLN
INTRAVENOUS | Status: AC
  Filled 2015-05-08: qty 200

## 2015-05-08 MED ORDER — NITROFURANTOIN MONOHYD MACRO 100 MG PO CAPS: 100.0000 mg | ORAL_CAPSULE | Freq: Every day | ORAL | Status: DC

## 2015-05-08 MED ORDER — IOHEXOL 300 MG/ML  SOLN
INTRAMUSCULAR | Status: DC | PRN
  Administered 2015-05-08: 5 mL via URETHRAL

## 2015-05-08 MED ORDER — OXYBUTYNIN CHLORIDE 5 MG PO TABS: ORAL_TABLET | ORAL | Status: DC

## 2015-05-08 MED ORDER — KETOROLAC TROMETHAMINE 15 MG/ML IJ SOLN
INTRAMUSCULAR | Status: AC
  Filled 2015-05-08: qty 1

## 2015-05-08 MED ORDER — BELLADONNA ALKALOIDS-OPIUM 16.2-60 MG RE SUPP
RECTAL | Status: AC
  Filled 2015-05-08: qty 1

## 2015-05-08 MED ORDER — FENTANYL CITRATE (PF) 100 MCG/2ML IJ SOLN
INTRAMUSCULAR | Status: AC
  Filled 2015-05-08: qty 2

## 2015-05-08 MED ORDER — FENTANYL CITRATE (PF) 100 MCG/2ML IJ SOLN
INTRAMUSCULAR | Status: DC | PRN
  Administered 2015-05-08 (×4): 50 ug via INTRAVENOUS

## 2015-05-08 MED ORDER — ONDANSETRON HCL 4 MG/2ML IJ SOLN
INTRAMUSCULAR | Status: DC | PRN
  Administered 2015-05-08: 4 mg via INTRAVENOUS

## 2015-05-08 MED ORDER — TRAMADOL-ACETAMINOPHEN 37.5-325 MG PO TABS: 1.0000 | ORAL_TABLET | Freq: Four times a day (QID) | ORAL | Status: DC | PRN

## 2015-05-08 MED ORDER — PHENYLEPHRINE HCL 10 MG/ML IJ SOLN
INTRAMUSCULAR | Status: DC | PRN
  Administered 2015-05-08: 80 ug via INTRAVENOUS
  Administered 2015-05-08 (×4): 40 ug via INTRAVENOUS

## 2015-05-08 MED ORDER — PHENAZOPYRIDINE HCL 200 MG PO TABS: 200.0000 mg | ORAL_TABLET | Freq: Three times a day (TID) | ORAL | Status: DC | PRN

## 2015-05-08 MED ORDER — LIDOCAINE HCL (CARDIAC) 20 MG/ML IV SOLN
INTRAVENOUS | Status: AC
  Filled 2015-05-08: qty 5

## 2015-05-08 MED ORDER — LIDOCAINE HCL (CARDIAC) 20 MG/ML IV SOLN
INTRAVENOUS | Status: DC | PRN
  Administered 2015-05-08: 50 mg via INTRAVENOUS

## 2015-05-08 MED ORDER — PHENYLEPHRINE 40 MCG/ML (10ML) SYRINGE FOR IV PUSH (FOR BLOOD PRESSURE SUPPORT)
PREFILLED_SYRINGE | INTRAVENOUS | Status: AC
  Filled 2015-05-08: qty 10

## 2015-05-08 MED ORDER — LACTATED RINGERS IV SOLN: INTRAVENOUS | Status: DC

## 2015-05-08 MED ORDER — CIPROFLOXACIN IN D5W 400 MG/200ML IV SOLN
400.0000 mg | INTRAVENOUS | Status: AC
  Administered 2015-05-08: 400 mg via INTRAVENOUS

## 2015-05-08 MED ORDER — LACTATED RINGERS IV SOLN
INTRAVENOUS | Status: DC | PRN
  Administered 2015-05-08: 07:00:00 via INTRAVENOUS

## 2015-05-08 MED ORDER — PROPOFOL 10 MG/ML IV BOLUS
INTRAVENOUS | Status: DC | PRN
  Administered 2015-05-08 (×2): 50 mg via INTRAVENOUS

## 2015-05-08 MED ORDER — KETOROLAC TROMETHAMINE 30 MG/ML IJ SOLN
15.0000 mg | Freq: Once | INTRAMUSCULAR | Status: AC
  Administered 2015-05-08: 15 mg via INTRAVENOUS

## 2015-05-08 MED ORDER — ONDANSETRON HCL 4 MG/2ML IJ SOLN: 4.0000 mg | Freq: Once | INTRAMUSCULAR | Status: DC | PRN

## 2015-05-08 SURGICAL SUPPLY — 25 items
BAG URO CATCHER STRL LF (MISCELLANEOUS) ×2 IMPLANT
BASKET STNLS GEMINI 4WIRE 3FR (BASKET) IMPLANT
BASKET ZERO TIP NITINOL 2.4FR (BASKET) IMPLANT
BSKT STON RTRVL GEM 120X11 3FR (BASKET)
BSKT STON RTRVL ZERO TP 2.4FR (BASKET)
CATH INTERMIT  6FR 70CM (CATHETERS) ×1 IMPLANT
CATH URET DUAL LUMEN 6-10FR 50 (CATHETERS) ×1 IMPLANT
CLOTH BEACON ORANGE TIMEOUT ST (SAFETY) ×2 IMPLANT
FIBER LASER FLEXIVA 1000 (UROLOGICAL SUPPLIES) IMPLANT
FIBER LASER FLEXIVA 200 (UROLOGICAL SUPPLIES) IMPLANT
FIBER LASER FLEXIVA 365 (UROLOGICAL SUPPLIES) IMPLANT
FIBER LASER FLEXIVA 550 (UROLOGICAL SUPPLIES) IMPLANT
FIBER LASER TRAC TIP (UROLOGICAL SUPPLIES) IMPLANT
GLOVE BIOGEL M STRL SZ7.5 (GLOVE) ×2 IMPLANT
GOWN STRL REUS W/TWL LRG LVL3 (GOWN DISPOSABLE) ×2 IMPLANT
GOWN STRL REUS W/TWL XL LVL3 (GOWN DISPOSABLE) ×2 IMPLANT
GUIDEWIRE STR DUAL SENSOR (WIRE) ×2 IMPLANT
IV NS 1000ML (IV SOLUTION) ×2
IV NS 1000ML BAXH (IV SOLUTION) ×1 IMPLANT
MANIFOLD NEPTUNE II (INSTRUMENTS) ×2 IMPLANT
NS IRRIG 1000ML POUR BTL (IV SOLUTION) ×2 IMPLANT
PACK CYSTO (CUSTOM PROCEDURE TRAY) ×2 IMPLANT
SCRUB PCMX 4 OZ (MISCELLANEOUS) ×2 IMPLANT
STENT URET 6FRX24 CONTOUR (STENTS) ×1 IMPLANT
TUBING CONNECTING 10 (TUBING) ×2 IMPLANT

## 2015-05-08 NOTE — Interval H&P Note (Signed)
History and Physical Interval Note:  05/08/2015 7:16 AM  Hildred Laser  has presented today for surgery, with the diagnosis of LEFT URETERAL STONE, GROSS HEMATURIA  The various methods of treatment have been discussed with the patient and family. After consideration of risks, benefits and other options for treatment, the patient has consented to  Procedure(s): CYSTOSCOPY/RETROGRADE/URETEROSCOPY/STONE EXTRACTION WITH BASKET (Left) CYSTOSCOPY WITH HOLMIUM LASER LITHOTRIPSY, possible (Left) CYSTOSCOPY WITH STENT PLACEMENT,POSSIBLE JJ STENT (Left) as a surgical intervention .  The patient's history has been reviewed, patient examined, no change in status, stable for surgery.  I have reviewed the patient's chart and labs.  Questions were answered to the patient's satisfaction.     Maahir Horst I Henry Utsey

## 2015-05-08 NOTE — Discharge Instructions (Signed)
Kidney Stones °Kidney stones (urolithiasis) are deposits that form inside your kidneys. The intense pain is caused by the stone moving through the urinary tract. When the stone moves, the ureter goes into spasm around the stone. The stone is usually passed in the urine.  °CAUSES  °· A disorder that makes certain neck glands produce too much parathyroid hormone (primary hyperparathyroidism). °· A buildup of uric acid crystals, similar to gout in your joints. °· Narrowing (stricture) of the ureter. °· A kidney obstruction present at birth (congenital obstruction). °· Previous surgery on the kidney or ureters. °· Numerous kidney infections. °SYMPTOMS  °· Feeling sick to your stomach (nauseous). °· Throwing up (vomiting). °· Blood in the urine (hematuria). °· Pain that usually spreads (radiates) to the groin. °· Frequency or urgency of urination. °DIAGNOSIS  °· Taking a history and physical exam. °· Blood or urine tests. °· CT scan. °· Occasionally, an examination of the inside of the urinary bladder (cystoscopy) is performed. °TREATMENT  °· Observation. °· Increasing your fluid intake. °· Extracorporeal shock wave lithotripsy--This is a noninvasive procedure that uses shock waves to break up kidney stones. °· Surgery may be needed if you have severe pain or persistent obstruction. There are various surgical procedures. Most of the procedures are performed with the use of small instruments. Only small incisions are needed to accommodate these instruments, so recovery time is minimized. °The size, location, and chemical composition are all important variables that will determine the proper choice of action for you. Talk to your health care provider to better understand your situation so that you will minimize the risk of injury to yourself and your kidney.  °HOME CARE INSTRUCTIONS  °· Drink enough water and fluids to keep your urine clear or pale yellow. This will help you to pass the stone or stone fragments. °· Strain  all urine through the provided strainer. Keep all particulate matter and stones for your health care provider to see. The stone causing the pain may be as small as a grain of salt. It is very important to use the strainer each and every time you pass your urine. The collection of your stone will allow your health care provider to analyze it and verify that a stone has actually passed. The stone analysis will often identify what you can do to reduce the incidence of recurrences. °· Only take over-the-counter or prescription medicines for pain, discomfort, or fever as directed by your health care provider. °· Keep all follow-up visits as told by your health care provider. This is important. °· Get follow-up X-rays if required. The absence of pain does not always mean that the stone has passed. It may have only stopped moving. If the urine remains completely obstructed, it can cause loss of kidney function or even complete destruction of the kidney. It is your responsibility to make sure X-rays and follow-ups are completed. Ultrasounds of the kidney can show blockages and the status of the kidney. Ultrasounds are not associated with any radiation and can be performed easily in a matter of minutes. °· Make changes to your daily diet as told by your health care provider. You may be told to: °¨ Limit the amount of salt that you eat. °¨ Eat 5 or more servings of fruits and vegetables each day. °¨ Limit the amount of meat, poultry, fish, and eggs that you eat. °· Collect a 24-hour urine sample as told by your health care provider. You may need to collect another urine sample every 6-12   months. °SEEK MEDICAL CARE IF: °· You experience pain that is progressive and unresponsive to any pain medicine you have been prescribed. °SEEK IMMEDIATE MEDICAL CARE IF:  °· Pain cannot be controlled with the prescribed medicine. °· You have a fever or shaking chills. °· The severity or intensity of pain increases over 18 hours and is not  relieved by pain medicine. °· You develop a new onset of abdominal pain. °· You feel faint or pass out. °· You are unable to urinate. °  °This information is not intended to replace advice given to you by your health care provider. Make sure you discuss any questions you have with your health care provider. °  °Document Released: 02/07/2005 Document Revised: 10/29/2014 Document Reviewed: 07/11/2012 °Elsevier Interactive Patient Education ©2016 [employer] ° °

## 2015-05-08 NOTE — Anesthesia Procedure Notes (Signed)
Procedure Name: LMA Insertion Date/Time: 05/08/2015 7:33 AM Performed by: Jonna Munro Pre-anesthesia Checklist: Patient identified, Emergency Drugs available, Suction available, Patient being monitored and Timeout performed Patient Re-evaluated:Patient Re-evaluated prior to inductionOxygen Delivery Method: Circle system utilized Preoxygenation: Pre-oxygenation with 100% oxygen Intubation Type: IV induction Ventilation: Mask ventilation without difficulty LMA: LMA inserted LMA Size: 3.0 Number of attempts: 1 Placement Confirmation: positive ETCO2 and breath sounds checked- equal and bilateral Tube secured with: Tape Dental Injury: Teeth and Oropharynx as per pre-operative assessment

## 2015-05-08 NOTE — Transfer of Care (Signed)
Immediate Anesthesia Transfer of Care Note  Patient: [name]  Procedure(s) Performed: Procedure(s): CYSTOSCOPY/RETROGRADE/URETEROSCOPY/JJ STENT PLACEMENT (Left)  Patient Location: PACU  Anesthesia Type:General  Level of Consciousness:  sedated, patient cooperative and responds to stimulation  Airway & Oxygen Therapy:Patient Spontanous Breathing and Patient connected to face mask oxgen  Post-op Assessment:  Report given to PACU RN and Post -op Vital signs reviewed and stable  Post vital signs:  Reviewed and stable  Last Vitals:  Filed Vitals:   05/08/15 0548  BP: 130/63  Pulse: 98  Temp: 36.6 C  Resp: 16    Complications: No apparent anesthesia complications

## 2015-05-08 NOTE — Anesthesia Postprocedure Evaluation (Signed)
Anesthesia Post Note  Patient: [name]  Procedure(s) Performed: Procedure(s) (LRB): CYSTOSCOPY/RETROGRADE/URETEROSCOPY/JJ STENT PLACEMENT (Left)  Patient location during evaluation: PACU Anesthesia Type: General Level of consciousness: awake and alert Pain management: pain level controlled Vital Signs Assessment: post-procedure vital signs reviewed and stable Respiratory status: spontaneous breathing, nonlabored ventilation, respiratory function stable and patient connected to nasal cannula oxygen Cardiovascular status: blood pressure returned to baseline and stable Postop Assessment: no signs of nausea or vomiting Anesthetic complications: no    Last Vitals:  Filed Vitals:   05/08/15 0946 05/08/15 1027  BP: 101/64 110/59  Pulse: 60 64  Temp: 36.4 C 36.4 C  Resp: 15 16    Last Pain:  Filed Vitals:   05/08/15 1028  PainSc: 2                  Reid Regas JENNETTE

## 2015-05-08 NOTE — Op Note (Signed)
Pre-operative diagnosis : Left lower ureteral stone with Left renal colic  Postoperative diagnosis:  Impacted Left lower ureteral stone with Left ureteral scarring  Operation:  Cystoscopy, Left retrograde pyelogram with interpretation; Left ureteroscopy, insertion of Left JJ stent  Surgeon:  S. Gaynelle Arabian, MD  First assistant:  None  Anesthesia:  General LMA  Preparation:  After appropriate preanesthesia, the patient was brought the operating, placed on the operating table in the dorsal supine position where general LMA anesthesia was introduced. She was then replaced in the dorsal lithotomy position with pubis was prepped with Betadine solution and draped in usual fashion. The history was reviewed. The armband was double checked. The side of the left flank was previous and marked. The left arm was previously marked.  Review history:  Active Problems Problems  1. Gross hematuria (R31.0) 2. Left ureteral stone (N20.1) 3. Microscopic hematuria (R31.29) 4. Renal colic on left side ([id number])  History of Present Illness    ` 75 yo female seen today in follow-up of a kidney stone, found on ER visit of 04/28/15 for Lt flank pain. She originally noted gross hematuria 2 weeks ago, and was treated for hemorrhagic cystitis with cipro for 5 days. She subsequently developed severe L flank pain last Tuesday, and gross hematuria. CT stone protocol ( Allergy IV contrast) at [facility] showed a 4 x 5 mm obstructing Lt proximal ureteral stone.    She has been treated with tamsulosin, oxycodone/Zofran 4mg . She was feeling well until Sunday ,when she developed a second episode of nausea and severe L flank pain. She is seen today for KUB after bowel clean-out to see if I can see the stone, and if lithotripsy is possible.    Statement of  Likelihood of Success: Excellent. TIME-OUT observed.:  Procedure:  Cystourethroscopy was accomplished, and the bladder appeared to be within normal limits. The trigone  was in normal position. There was no evidence of bladder stone tumor or diverticular formation. There was no trabeculation and no sidewall formation. The urethra was normal. There was no pelvic organ prolapse.  Left retrograde pyelogram was performed, but no contrast would go proximal to the presumed left lower ureteral calyxes, which found with triple magnification because of low calcium content in the low pelvis. This was presumed to be the left lower ureteral calculus.  A 0.038 guidewire was manipulated past the stone, but would not pass proximally toward, and was felt to be a false passage. This was removed, and the 6.5 short ureteroscope was passed into the ureteral orifice. A false passage was identified, and the true ureter identified. A guidewire was passed through a densely strictured distal ureteral stricture. No stone could be identified proximal to the stricture, it is felt that the stone may be grown into the sidewall of the ureter. A second false passage was identified and trying to manipulate a wire through the stricture. A wire was finally manipulated through the stricture, and felt to coil in the renal pelvis.  I was unable to pass the ureteroscope along the wire through the stricture. Because of the density stricture, elected to pass a double-J stent, and [place], allow the stricture to dilate. The patient will return in 6 weeks for ureteral dilation, and hopefully ureteroscopy. My concern is that the stone has grown into the sidewall of the ureter.  With the double-J catheter in good position, the bladder was drained of fluid, and the patient was awakened and taken to recovery room in good condition.

## 2015-05-14 ENCOUNTER — Ambulatory Visit: Payer: Medicare Other | Admitting: Physician Assistant

## 2015-05-18 ENCOUNTER — Ambulatory Visit
Admission: RE | Admit: 2015-05-18 | Discharge: 2015-05-18 | Disposition: A | Discharge status: Home / Self Care | Payer: Medicare Other | Source: Ambulatory Visit

## 2015-05-18 DIAGNOSIS — Z1231 Encounter for screening mammogram for malignant neoplasm of breast: Secondary | ICD-10-CM

## 2015-05-19 ENCOUNTER — Other Ambulatory Visit: Payer: Self-pay | Admitting: Urology

## 2015-05-30 ENCOUNTER — Other Ambulatory Visit: Payer: Self-pay | Admitting: Cardiovascular Disease

## 2015-06-17 ENCOUNTER — Other Ambulatory Visit: Payer: Self-pay | Admitting: Cardiovascular Disease

## 2015-06-17 NOTE — Telephone Encounter (Signed)
Ok to refill 

## 2015-06-17 NOTE — Telephone Encounter (Signed)
OK to refill

## 2015-07-01 NOTE — Patient Instructions (Addendum)
[name]  07/01/2015   Your procedure is scheduled on: 07-06-15  Report to [facility] Main  Entrance take [facility]  elevators to 3rd floor to  [place] at 700 AM.  Call this number if you have problems the morning of surgery [phone]   Remember: ONLY 1 PERSON MAY GO WITH YOU TO SHORT STAY TO GET  READY MORNING OF [place].  Do not eat food or drink liquids :After Midnight.     Take these medicines the morning of surgery with A SIP OF WATER: DILTIAZEM (CARDIZEM CD), ESCITALOPRAM (LEXAPRO),  PYRIDIUM IF NEEDED                               You may not have any metal on your body including hair pins and              piercings  Do not wear jewelry, make-up, lotions, powders or perfumes, deodorant             Do not wear nail polish.  Do not shave  48 hours prior to surgery.              Men may shave face and neck.   Do not bring valuables to the hospital. [place].  Contacts, dentures or bridgework may not be worn into surgery.  Leave suitcase in the car. After surgery it may be brought to your room.     Patients discharged the day of surgery will not be allowed to drive home.  Name and phone number of your driver: brother max trevillian and sisyer in law daisy to stay  Special Instructions: N/A              Please read over the following fact sheets you were given: _____________________________________________________________________             [facility] - Preparing for Surgery Before surgery, you can play an important role.  Because skin is not sterile, your skin needs to be as free of germs as possible.  You can reduce the number of germs on your skin by washing with CHG (chlorahexidine gluconate) soap before surgery.  CHG is an antiseptic cleaner which kills germs and bonds with the skin to continue killing germs even after washing. Please DO NOT use if you have an allergy to CHG  or antibacterial soaps.  If your skin becomes reddened/irritated stop using the CHG and inform your nurse when you arrive at Short Stay. Do not shave (including legs and underarms) for at least 48 hours prior to the first CHG shower.  You may shave your face/neck. Please follow these instructions carefully:  1.  Shower with CHG Soap the night before surgery and the  morning of Surgery.  2.  If you choose to wash your hair, wash your hair first as usual with your  normal  shampoo.  3.  After you shampoo, rinse your hair and body thoroughly to remove the  shampoo.                           4.  Use CHG as you would any other liquid soap.  You can apply chg directly  to the skin and wash                       Gently with a scrungie or clean washcloth.  5.  Apply the CHG Soap to your body ONLY FROM THE NECK DOWN.   Do not use on face/ open                           Wound or open sores. Avoid contact with eyes, ears mouth and genitals (private parts).                       Wash face,  Genitals (private parts) with your normal soap.             6.  Wash thoroughly, paying special attention to the area where your surgery  will be performed.  7.  Thoroughly rinse your body with warm water from the neck down.  8.  DO NOT shower/wash with your normal soap after using and rinsing off  the CHG Soap.                9.  Pat yourself dry with a clean towel.            10.  Wear clean pajamas.            11.  Place clean sheets on your bed the night of your first shower and do not  sleep with pets. Day of Surgery : Do not apply any lotions/deodorants the morning of surgery.  Please wear clean clothes to the hospital/surgery center.  FAILURE TO FOLLOW THESE INSTRUCTIONS MAY RESULT IN THE CANCELLATION OF YOUR SURGERY PATIENT SIGNATURE_________________________________  NURSE SIGNATURE__________________________________  ________________________________________________________________________

## 2015-07-01 NOTE — Progress Notes (Signed)
EKG  05-06-15 EPIC  LOV DR MCALHANY CARDIO 05-05-15 STRESS TEST 09-26-13 EPIC  ECHO 09-26-13 EPIC US CAROTID DOPPLER 05-06-14 EPIC

## 2015-07-02 ENCOUNTER — Encounter (HOSPITAL_COMMUNITY): Payer: Self-pay

## 2015-07-02 ENCOUNTER — Encounter (HOSPITAL_COMMUNITY)
Admission: RE | Admit: 2015-07-02 | Discharge: 2015-07-02 | Disposition: A | Discharge status: Home / Self Care | Payer: Medicare Other | Source: Ambulatory Visit | Attending: Urology | Admitting: Urology

## 2015-07-02 DIAGNOSIS — Z01810 Encounter for preprocedural cardiovascular examination: Secondary | ICD-10-CM | POA: Diagnosis present

## 2015-07-02 DIAGNOSIS — Z01812 Encounter for preprocedural laboratory examination: Secondary | ICD-10-CM | POA: Diagnosis present

## 2015-07-02 HISTORY — DX: Lichen planus, unspecified: L43.9

## 2015-07-02 LAB — CBC
HCT: 37.5 % (ref 36.0–46.0)
Hemoglobin: 12.5 g/dL (ref 12.0–15.0)
MCH: 30.2 pg (ref 26.0–34.0)
MCHC: 33.3 g/dL (ref 30.0–36.0)
MCV: 90.6 fL (ref 78.0–100.0)
PLATELETS: 253 10*3/uL (ref 150–400)
RBC: 4.14 MIL/uL (ref 3.87–5.11)
RDW: 13 % (ref 11.5–15.5)
WBC: 6.1 10*3/uL (ref 4.0–10.5)

## 2015-07-02 LAB — BASIC METABOLIC PANEL
Anion gap: 7 (ref 5–15)
BUN: 12 mg/dL (ref 6–20)
CALCIUM: 8.9 mg/dL (ref 8.9–10.3)
CHLORIDE: 108 mmol/L (ref 101–111)
CO2: 30 mmol/L (ref 22–32)
CREATININE: 0.88 mg/dL (ref 0.44–1.00)
GFR calc Af Amer: >60 mL/min (ref >60)
GFR calc non Af Amer: >60 mL/min (ref >60)
GLUCOSE: 95 mg/dL (ref 65–99)
Potassium: 3.8 mmol/L (ref 3.5–5.1)
Sodium: 145 mmol/L (ref 135–145)

## 2015-07-05 NOTE — Anesthesia Preprocedure Evaluation (Addendum)
Anesthesia Evaluation  Patient identified by MRN, date of birth, ID band Patient awake  General Assessment Comment:TIA (transient ischemic attack)   . Osteoporosis  . Anxiety  . Sleep disorder  . PUD (peptic ulcer disease)  . Appendicitis  . Hx of adenomatous colonic polyps  . Diverticulitis  . GERD (gastroesophageal reflux disease)  . IBS (irritable bowel syndrome)  . Fibromyalgia  . Hypertension  . Thyroid disease  . Back abscess  . PVC (premature ventricular contraction)  . Hx of cardiovascular stress test    ETT-Myoview (8/15): Poor exercise capacity, no ischemia, EF 61%-normal study       Reviewed: Allergy & Precautions, NPO status , Patient's Chart, lab work & pertinent test results  History of Anesthesia Complications Negative for: history of anesthetic complications  Airway Mallampati: III  TM Distance: >3 FB Neck ROM: Full    Dental no notable dental hx. (+) Dental Advisory Given   Pulmonary neg pulmonary ROS,    Pulmonary exam normal breath sounds clear to auscultation       Cardiovascular hypertension, Pt. on medications Normal cardiovascular exam Rhythm:Regular Rate:Normal  Hx of PVCs, followed by cardiology, no clearance needed for surgery per note   Neuro/Psych PSYCHIATRIC DISORDERS Anxiety TIA   GI/Hepatic Neg liver ROS, GERD  Medicated and Controlled,  Endo/Other  negative endocrine ROS  Renal/GU negative Renal ROS  negative genitourinary   Musculoskeletal  (+) Fibromyalgia -  Abdominal   Peds negative pediatric ROS (+)  Hematology negative hematology ROS (+)   Anesthesia Other Findings   Reproductive/Obstetrics negative OB ROS                            Anesthesia Physical Anesthesia Plan  ASA: II  Anesthesia Plan: General   Post-op Pain Management:    Induction: Intravenous  Airway Management  Planned: LMA  Additional Equipment:   Intra-op Plan:   Post-operative Plan: Extubation in OR  Informed Consent: I have reviewed the patients History and Physical, chart, labs and discussed the procedure including the risks, benefits and alternatives for the proposed anesthesia with the patient or authorized representative who has indicated his/her understanding and acceptance.   Dental advisory given  Plan Discussed with: CRNA  Anesthesia Plan Comments:         Anesthesia Quick Evaluation

## 2015-07-06 ENCOUNTER — Ambulatory Visit (HOSPITAL_COMMUNITY): Payer: Medicare Other | Admitting: Anesthesiology

## 2015-07-06 ENCOUNTER — Ambulatory Visit (HOSPITAL_COMMUNITY): Payer: Medicare Other

## 2015-07-06 ENCOUNTER — Encounter (HOSPITAL_COMMUNITY): Payer: Self-pay | Admitting: *Deleted

## 2015-07-06 ENCOUNTER — Encounter (HOSPITAL_COMMUNITY)
Admission: RE | Disposition: A | Discharge status: Home / Self Care | Payer: Self-pay | Source: Ambulatory Visit | Attending: Urology

## 2015-07-06 ENCOUNTER — Ambulatory Visit (HOSPITAL_COMMUNITY)
Admission: RE | Admit: 2015-07-06 | Discharge: 2015-07-06 | Disposition: A | Discharge status: Home / Self Care | Payer: Medicare Other | Source: Ambulatory Visit | Attending: Urology | Admitting: Urology

## 2015-07-06 DIAGNOSIS — E7800 Pure hypercholesterolemia, unspecified: Secondary | ICD-10-CM | POA: Insufficient documentation

## 2015-07-06 DIAGNOSIS — Z791 Long term (current) use of non-steroidal anti-inflammatories (NSAID): Secondary | ICD-10-CM | POA: Insufficient documentation

## 2015-07-06 DIAGNOSIS — M1990 Unspecified osteoarthritis, unspecified site: Secondary | ICD-10-CM | POA: Diagnosis not present

## 2015-07-06 DIAGNOSIS — N135 Crossing vessel and stricture of ureter without hydronephrosis: Secondary | ICD-10-CM | POA: Diagnosis not present

## 2015-07-06 DIAGNOSIS — Z79899 Other long term (current) drug therapy: Secondary | ICD-10-CM | POA: Insufficient documentation

## 2015-07-06 DIAGNOSIS — K219 Gastro-esophageal reflux disease without esophagitis: Secondary | ICD-10-CM | POA: Diagnosis not present

## 2015-07-06 DIAGNOSIS — N201 Calculus of ureter: Secondary | ICD-10-CM | POA: Diagnosis not present

## 2015-07-06 DIAGNOSIS — Z8673 Personal history of transient ischemic attack (TIA), and cerebral infarction without residual deficits: Secondary | ICD-10-CM | POA: Diagnosis not present

## 2015-07-06 DIAGNOSIS — Z79891 Long term (current) use of opiate analgesic: Secondary | ICD-10-CM | POA: Diagnosis not present

## 2015-07-06 DIAGNOSIS — Z7982 Long term (current) use of aspirin: Secondary | ICD-10-CM | POA: Insufficient documentation

## 2015-07-06 DIAGNOSIS — I493 Ventricular premature depolarization: Secondary | ICD-10-CM | POA: Diagnosis not present

## 2015-07-06 DIAGNOSIS — M797 Fibromyalgia: Secondary | ICD-10-CM | POA: Diagnosis not present

## 2015-07-06 DIAGNOSIS — I10 Essential (primary) hypertension: Secondary | ICD-10-CM | POA: Insufficient documentation

## 2015-07-06 HISTORY — PX: CYSTOSCOPY WITH RETROGRADE PYELOGRAM, URETEROSCOPY AND STENT PLACEMENT: SHX5789

## 2015-07-06 SURGERY — CYSTOURETEROSCOPY, WITH RETROGRADE PYELOGRAM AND STENT INSERTION
Anesthesia: General | Laterality: Left

## 2015-07-06 MED ORDER — PROPOFOL 10 MG/ML IV BOLUS
INTRAVENOUS | Status: DC | PRN
  Administered 2015-07-06: 120 mg via INTRAVENOUS

## 2015-07-06 MED ORDER — BELLADONNA ALKALOIDS-OPIUM 16.2-60 MG RE SUPP
RECTAL | Status: DC | PRN
  Administered 2015-07-06: 1 via RECTAL

## 2015-07-06 MED ORDER — ONDANSETRON HCL 4 MG/2ML IJ SOLN
INTRAMUSCULAR | Status: AC
  Filled 2015-07-06: qty 2

## 2015-07-06 MED ORDER — OXYBUTYNIN CHLORIDE 5 MG PO TABS
5.0000 mg | ORAL_TABLET | Freq: Two times a day (BID) | ORAL | Status: DC | PRN
  Administered 2015-07-06: 5 mg via ORAL
  Filled 2015-07-06: qty 1

## 2015-07-06 MED ORDER — DIATRIZOATE MEGLUMINE 30 % UR SOLN
URETHRAL | Status: DC | PRN
  Administered 2015-07-06: 20 mL via URETHRAL

## 2015-07-06 MED ORDER — PHENYLEPHRINE HCL 10 MG/ML IJ SOLN
INTRAMUSCULAR | Status: DC | PRN
  Administered 2015-07-06: 80 ug via INTRAVENOUS

## 2015-07-06 MED ORDER — BELLADONNA ALKALOIDS-OPIUM 16.2-60 MG RE SUPP
RECTAL | Status: AC
  Filled 2015-07-06: qty 1

## 2015-07-06 MED ORDER — PROPOFOL 10 MG/ML IV BOLUS
INTRAVENOUS | Status: AC
  Filled 2015-07-06: qty 20

## 2015-07-06 MED ORDER — LIDOCAINE HCL 2 % EX GEL
CUTANEOUS | Status: AC
  Filled 2015-07-06: qty 5

## 2015-07-06 MED ORDER — CIPROFLOXACIN IN D5W 400 MG/200ML IV SOLN
INTRAVENOUS | Status: AC
  Filled 2015-07-06: qty 200

## 2015-07-06 MED ORDER — MIDAZOLAM HCL 2 MG/2ML IJ SOLN
INTRAMUSCULAR | Status: AC
  Filled 2015-07-06: qty 2

## 2015-07-06 MED ORDER — FENTANYL CITRATE (PF) 100 MCG/2ML IJ SOLN: 25.0000 ug | INTRAMUSCULAR | Status: DC | PRN

## 2015-07-06 MED ORDER — DIATRIZOATE MEGLUMINE 30 % UR SOLN
URETHRAL | Status: AC
  Filled 2015-07-06: qty 100

## 2015-07-06 MED ORDER — CIPROFLOXACIN IN D5W 400 MG/200ML IV SOLN
400.0000 mg | INTRAVENOUS | Status: AC
  Administered 2015-07-06: 400 mg via INTRAVENOUS

## 2015-07-06 MED ORDER — LACTATED RINGERS IV SOLN
INTRAVENOUS | Status: DC
  Administered 2015-07-06: 1000 mL via INTRAVENOUS

## 2015-07-06 MED ORDER — PHENAZOPYRIDINE HCL 200 MG PO TABS: 200.0000 mg | ORAL_TABLET | Freq: Three times a day (TID) | ORAL | Status: DC | PRN

## 2015-07-06 MED ORDER — PHENYLEPHRINE 40 MCG/ML (10ML) SYRINGE FOR IV PUSH (FOR BLOOD PRESSURE SUPPORT)
PREFILLED_SYRINGE | INTRAVENOUS | Status: AC
  Filled 2015-07-06: qty 10

## 2015-07-06 MED ORDER — LIDOCAINE HCL (CARDIAC) 20 MG/ML IV SOLN
INTRAVENOUS | Status: AC
  Filled 2015-07-06: qty 5

## 2015-07-06 MED ORDER — FENTANYL CITRATE (PF) 250 MCG/5ML IJ SOLN
INTRAMUSCULAR | Status: DC | PRN
  Administered 2015-07-06 (×2): 25 ug via INTRAVENOUS
  Administered 2015-07-06: 100 ug via INTRAVENOUS

## 2015-07-06 MED ORDER — ONDANSETRON HCL 4 MG/2ML IJ SOLN: 4.0000 mg | Freq: Once | INTRAMUSCULAR | Status: DC | PRN

## 2015-07-06 MED ORDER — NITROFURANTOIN MONOHYD MACRO 100 MG PO CAPS: 100.0000 mg | ORAL_CAPSULE | Freq: Two times a day (BID) | ORAL | Status: DC

## 2015-07-06 MED ORDER — MIDAZOLAM HCL 2 MG/2ML IJ SOLN
INTRAMUSCULAR | Status: DC | PRN
  Administered 2015-07-06 (×2): 0.5 mg via INTRAVENOUS

## 2015-07-06 MED ORDER — OXYBUTYNIN CHLORIDE 5 MG PO TABS: ORAL_TABLET | ORAL | Status: DC

## 2015-07-06 MED ORDER — MELOXICAM 15 MG PO TABS: 15.0000 mg | ORAL_TABLET | Freq: Every day | ORAL | Status: DC

## 2015-07-06 MED ORDER — SODIUM CHLORIDE 0.9 % IR SOLN
Status: DC | PRN
  Administered 2015-07-06: 3000 mL

## 2015-07-06 MED ORDER — ONDANSETRON HCL 4 MG/2ML IJ SOLN
INTRAMUSCULAR | Status: DC | PRN
  Administered 2015-07-06: 4 mg via INTRAVENOUS

## 2015-07-06 MED ORDER — FENTANYL CITRATE (PF) 250 MCG/5ML IJ SOLN
INTRAMUSCULAR | Status: AC
  Filled 2015-07-06: qty 5

## 2015-07-06 MED ORDER — TRAMADOL-ACETAMINOPHEN 37.5-325 MG PO TABS: 1.0000 | ORAL_TABLET | Freq: Four times a day (QID) | ORAL | Status: DC | PRN

## 2015-07-06 MED ORDER — LIDOCAINE HCL (CARDIAC) 20 MG/ML IV SOLN
INTRAVENOUS | Status: DC | PRN
  Administered 2015-07-06: 40 mg via INTRAVENOUS

## 2015-07-06 SURGICAL SUPPLY — 25 items
APL SKNCLS STERI-STRIP NONHPOA (GAUZE/BANDAGES/DRESSINGS) ×2
BAG URO CATCHER STRL LF (MISCELLANEOUS) ×3 IMPLANT
BASKET STNLS GEMINI 4WIRE 3FR (BASKET) IMPLANT
BASKET ZERO TIP NITINOL 2.4FR (BASKET) ×2 IMPLANT
BENZOIN TINCTURE PRP APPL 2/3 (GAUZE/BANDAGES/DRESSINGS) ×2 IMPLANT
BSKT STON RTRVL GEM 120X11 3FR (BASKET)
BSKT STON RTRVL ZERO TP 2.4FR (BASKET) ×2
CATH INTERMIT  6FR 70CM (CATHETERS) ×3 IMPLANT
CATH URET DUAL LUMEN 6-10FR 50 (CATHETERS) ×3 IMPLANT
CLOTH BEACON ORANGE TIMEOUT ST (SAFETY) ×3 IMPLANT
DRSG TEGADERM 2-3/8X2-3/4 SM (GAUZE/BANDAGES/DRESSINGS) ×2 IMPLANT
FIBER LASER FLEXIVA 1000 (UROLOGICAL SUPPLIES) IMPLANT
FIBER LASER FLEXIVA 365 (UROLOGICAL SUPPLIES) IMPLANT
FIBER LASER FLEXIVA 550 (UROLOGICAL SUPPLIES) IMPLANT
FIBER LASER TRAC TIP (UROLOGICAL SUPPLIES) IMPLANT
GLOVE BIOGEL M STRL SZ7.5 (GLOVE) ×3 IMPLANT
GOWN STRL REUS W/TWL XL LVL3 (GOWN DISPOSABLE) ×5 IMPLANT
GUIDEWIRE STR DUAL SENSOR (WIRE) ×5 IMPLANT
IV NS 1000ML (IV SOLUTION) ×3
IV NS 1000ML BAXH (IV SOLUTION) ×2 IMPLANT
MANIFOLD NEPTUNE II (INSTRUMENTS) ×3 IMPLANT
PACK CYSTO (CUSTOM PROCEDURE TRAY) ×3 IMPLANT
SCRUB PCMX 4 OZ (MISCELLANEOUS) ×3 IMPLANT
STENT URET 6FRX24 CONTOUR (STENTS) ×2 IMPLANT
TUBING CONNECTING 10 (TUBING) ×3 IMPLANT

## 2015-07-06 NOTE — Anesthesia Procedure Notes (Signed)
Procedure Name: LMA Insertion Date/Time: 07/06/2015 9:03 AM Performed by: Cynda Familia Pre-anesthesia Checklist: Patient identified, Emergency Drugs available, Suction available and Patient being monitored Patient Re-evaluated:Patient Re-evaluated prior to inductionOxygen Delivery Method: Circle System Utilized Preoxygenation: Pre-oxygenation with 100% oxygen Intubation Type: IV induction Ventilation: Mask ventilation without difficulty LMA: LMA inserted LMA Size: 4.0 Tube type: Oral Number of attempts: 1 Placement Confirmation: positive ETCO2 Tube secured with: Tape Dental Injury: Teeth and Oropharynx as per pre-operative assessment  Comments: Smooth IV induction- LMA inserted atraumatic--- teeth and mouth as preop--- Jillyn Hidden present and assisted with induction- bilat BS

## 2015-07-06 NOTE — Transfer of Care (Signed)
Immediate Anesthesia Transfer of Care Note  Patient: [name]  Procedure(s) Performed: Procedure(s): CYSTOSCOPY WITH LEFT RETROGRADE PYELOGRAM AND INTERPRETATION, LEFT URETEROSCOPY WITH DUAL LUMEN PYELOSCOPY, IDENTIFCATION AND DILATION OF URETERAL STRICTURE, JJ STENT REMOVAL, BASKET EXTRACTION OF RENAL PELVIS STONE, EXTRACTION OF ORGANIZED RENAL PELVIC BLOOD CLOT (Left)  Patient Location: PACU  Anesthesia Type:General  Level of Consciousness: sedated  Airway & Oxygen Therapy: Patient Spontanous Breathing and Patient connected to face mask oxygen  Post-op Assessment: Report given to RN and Post -op Vital signs reviewed and stable  Post vital signs: Reviewed and stable  Last Vitals:  Filed Vitals:   07/06/15 0801  BP: 138/70  Pulse: 71  Temp: 36.8 C  Resp: 20    Last Pain: There were no vitals filed for this visit.    Patients Stated Pain Goal: 4 ([id number])  Complications: No apparent anesthesia complications

## 2015-07-06 NOTE — H&P (Signed)
Reason For Visit 1 week f/u   Active Problems Problems  1. Left ureteral stone (N20.1)   Assessed By: Carolan Clines (Urology); Last Assessed: 15 May 2015  History of Present Illness     75 yo Mendoza returns today for a 1 week post op f/u. She is s/p cysto/Lt RPG/Lt ureteroscopy/Lt JJ stent on 05/08/15. There was a Lt lower ureteral stricture & concern that the stone had grown into the wall of the ureter. She will need to have repeat cysto/Lt ureteroscopy in 6 weeks to allow Lt ureter to dilate.     seen today in follow-up of a kidney stone, found on ER visit of 04/28/15 for Lt flank pain. She originally noted gross hematuria 2 weeks ago, and was treated for hemorrhagic cystitis with cipro for 5 days. She subsequently developed severe L flank pain last Tuesday, and gross hematuria. CT stone protocol ( Allergy IV contrast) at [facility] showed a 4 x 5 mm obstructing Lt proximal ureteral stone.       She has been treated with tamsulosin, oxycodone/Zofran 4mg . She was feeling well until Sunday ,when she developed a second episode of nausea and severe L flank pain. She is seen today for KUB after bowel clean-out to see if I can see the stone, and if lithotripsy is possible.   Past Medical History Problems  1. History of Anxiety (F41.9) 2. History of arthritis (Z87.39) 3. History of cardiac disorder (Z86.79) 4. History of cardiac murmur (Z86.79) 5. History of diverticulitis of colon (Z87.19) 6. History of esophageal reflux (Z87.19) 7. History of fibromyalgia (Z87.39) 8. History of hypercholesterolemia (Z86.39) 9. History of hypertension (Z86.79) 10. History of osteoporosis (Z87.39) 11. History of peptic ulcer (Z87.11) 12. History of thyroid disease (Z86.39) 13. History of transient cerebral ischemia (Z86.73) 14. History of PVC (premature ventricular contraction) (I49.3)  Surgical History Problems  1. History of Appendectomy 2. History of Neuroplasty Median Nerve At Carpal  Tunnel 3. History of Total Abdominal Hysterectomy 4. History of Wrist Surgery Left  Current Meds 1. Acetaminophen 500 MG Oral Tablet;  Therapy: (Recorded:10Mar2017) to Recorded 2. Aspirin 81 MG TABS;  Therapy: (Recorded:10Mar2017) to Recorded 3. Desoximetasone CREA;  Therapy: (Recorded:10Mar2017) to Recorded 4. Dicyclomine HCl - 10 MG Oral Capsule;  Therapy: (Recorded:10Mar2017) to Recorded 5. DiltiaZEM HCl - 120 MG Oral Tablet;  Therapy: (Recorded:10Mar2017) to Recorded 6. Ensure Plus LIQD;  Therapy: (Recorded:10Mar2017) to Recorded 7. Escitalopram Oxalate 10 MG Oral Tablet;  Therapy: (Recorded:10Mar2017) to Recorded 8. Esomeprazole Magnesium 20 MG Oral Capsule Delayed Release;  Therapy: (Recorded:10Mar2017) to Recorded 9. Ibuprofen 200 MG Oral Capsule;  Therapy: (Recorded:10Mar2017) to Recorded 10. Maalox Max SUSP;   Therapy: (Recorded:10Mar2017) to Recorded 11. Macrobid 100 MG Oral Capsule;   Therapy: (Recorded:24Mar2017) to Recorded 12. Meclizine HCl - 25 MG Oral Tablet;   Therapy: (Recorded:10Mar2017) to Recorded 13. Ondansetron 4 MG Oral Tablet Dispersible; TAKE 1 TABLET 4 times daily PRN nausea;   Therapy: [id number] to (Evaluate:27Mar2017)  Requested for: [id number]; Last   Rx:13Mar2017 Ordered 14. Ondansetron 4 MG Oral Tablet Dispersible;   Therapy: (Recorded:10Mar2017) to Recorded 15. Oxybutynin Chloride 5 MG Oral Tablet;   Therapy: (Recorded:24Mar2017) to Recorded 16. OxyCODONE HCl - 5 MG Oral Tablet;   Therapy: (Recorded:10Mar2017) to Recorded 17. Pravastatin Sodium 20 MG Oral Tablet;   Therapy: (Recorded:10Mar2017) to Recorded 18. Pyridium 200 MG Oral Tablet;   Therapy: (Recorded:24Mar2017) to Recorded 19. Tamsulosin HCl - 0.4 MG Oral Capsule; TAKE 1 CAPSULE Bedtime;   Therapy: [id number] to (Last Rx:13Mar2017)  Requested for: [id number] Ordered 20. Tamsulosin HCl - 0.4 MG Oral Capsule;   Therapy: (Recorded:10Mar2017) to Recorded 21. Tramadol-Acetaminophen  37.5-325 MG Oral Tablet;   Therapy: (Recorded:24Mar2017) to Recorded 22. Tums CHEW;   Therapy: (Recorded:10Mar2017) to Recorded  Allergies Medication  1. Penicillins 2. Sulfa Drugs 3. Iodine SOLN 4. MetroNIDAZOLE CAPS 5. PredniSONE (Pak) TABS Non-Medication  6. Contrast Dye  Family History Problems  1. Family history of Deceased : Mother, Father 2. Family history of acute myocardial infarction (Z82.49) : Father, Maternal Grandfather 3. Family history of colon cancer (Z80.0) : Paternal Grandfather 31. Family history of dementia (Z81.8) : Mother 5. Family history of diabetes mellitus (Z83.3) : Brother 41. Family history of glaucoma (Z83.511) : Mother 7. Family history of hypertension (Z82.49) : Mother, Brother 64. Family history of lung cancer (Z80.1) : Father, Brother 66. Family history of macular degeneration [id number]) : Mother Kaitlyn. Family history of malignant neoplasm (Z80.9) : Paternal Grandmother 28. Family history of stroke (Z82.3) : Mother 63. Family history of tuberculosis (Z67.1) : Paternal Grandfather  Social History Problems  1. Has no children 2. Never a smoker 3. No alcohol use 4. No caffeine use 5. Retired 6. Widowed (Z63.4)  Review of Systems Genitourinary, eye, otolaryngeal, hematologic/lymphatic, cardiovascular, pulmonary, endocrine, musculoskeletal, neurological and psychiatric system(s) were reviewed and pertinent findings if present are noted and are otherwise negative.  Genitourinary: hematuria.  Gastrointestinal: nausea, vomiting, flank pain and abdominal pain, but no heartburn, no diarrhea, no constipation and no melena.  Constitutional: feeling poorly (malaise) and feeling tired (fatigue), but no fever and no night sweats.    Vitals Vital Signs [Data Includes: Last 1 Day]  Recorded: [id number] 08:32AM  Blood Pressure: 132 / 81 Temperature: 97.8 F Heart Rate: 79  Physical Exam Constitutional: Well nourished and well developed . No acute distress.   ENT:. The ears and nose are normal in appearance.  Pulmonary: No respiratory distress and normal respiratory rhythm and effort.  Cardiovascular: Heart rate and rhythm are normal . No peripheral edema.  Abdomen: The abdomen is mildly obese, but not distended. The abdomen is soft and nontender. No masses are palpated. Mild tenderness in the LLQ is present. mild left CVA tenderness. Bowel sounds are normal. No hernias are palpable. No hepatosplenomegaly noted.  Lymphatics: The femoral and inguinal nodes are not enlarged or tender.  Skin: Normal skin turgor, no visible rash and no visible skin lesions.  Neuro/Psych:. Mood and affect are appropriate.    Results/Data  15 May 2015 8:10 AM  UA With REFLEX    COLOR ORANGE     APPEARANCE CLOUDY     SPECIFIC GRAVITY     pH TNP     GLUCOSE TNP     BILIRUBIN TNP     KETONE TNP     BLOOD TNP     PROTEIN TNP     NITRITE TNP     LEUKOCYTE ESTERASE TNP     WBC 6-10     RBC >60     SQUAMOUS EPITHELIAL/HPF 6-10     BACTERIA FEW     CRYSTALS NONE SEEN     CASTS NONE SEEN     Yeast NONE SEEN  Assessment Assessed  1. Left ureteral stone (N20.1)  L Lower ureteral stone, grown into the Left Lower ureter, with Left Lower ureteral stricture. She will have the Left JJ stent in place for 6 weeks, then 2nd attempt with ureteral dilation and ureteroscopy. She may eventually need Left ureteral re-implantation.  Plan Left ureteral stone  1. Follow-up Schedule Surgery Office  Follow-up  Status: Hold For - Appointment   Requested for: [phone]  Schedule 2nd surgery for 6 weeks.   Discussion/Summary cc: Dr. Kathleen Lime     Signatures Electronically signed by : Carolan Clines, M.D.; May 15 2015  9:02AM EST

## 2015-07-06 NOTE — Op Note (Signed)
Pre-operative diagnosis :  Left lower ureteral stricture; Left UPJ stone  Postoperative diagnosis:  Same  Operation: Cystoscopy, removal of Left JJ stent; Left retrograde pyelogram with interpretation; left ureteroscopy, pyeloscopy, basket extraction of Left UPJ stone ( 89mm), extraction of Left renal pelvic organized blood clot; Left JJ stent placement, with suture attached. Photodocumentation.   Surgeon:  Chauncey Cruel. Gaynelle Arabian, MD  First assistant:  None  Anesthesia: general  Preparation:  After appropriate preanesthesia, the patient was brought to the operating room, placed on the operating table in the dorsal supine position where general LMA anesthesia was introduced. She was then replaced in the dorsal lithotomy position with pubis was prepped with Betadine solution and draped in usual fashion.  Review history:  1. Left ureteral stone (N20.1)  Assessed By: Carolan Clines (Urology); Last Assessed: 15 May 2015  History of Present Illness    75 yo female returns today for a 1 week post op f/u. She is s/p cysto/Lt RPG/Lt ureteroscopy/Lt JJ stent on 05/08/15. There was a Lt lower ureteral stricture & concern that the stone had grown into the wall of the ureter. She will need to have repeat cysto/Lt ureteroscopy in 6 weeks to allow Lt ureter to dilate.    seen today in follow-up of a kidney stone, found on ER visit of 04/28/15 for Lt flank pain. She originally noted gross hematuria 2 weeks ago, and was treated for hemorrhagic cystitis with cipro for 5 days. She subsequently developed severe L flank pain last Tuesday, and gross hematuria. CT stone protocol ( Allergy IV contrast) at [facility] showed a 4 x 5 mm obstructing Lt proximal ureteral stone.      She has been treated with tamsulosin, oxycodone/Zofran 4mg . She was feeling well until Sunday ,when she developed a second episode of nausea and severe L flank pain. She is seen today for KUB after bowel clean-out to see if I can see the  stone, and if lithotripsy is possible.   Statement of  Likelihood of Success: Excellent. TIME-OUT observed.:  Procedure: Cystourethroscopy was accomplished showing early encrustation of the left double-J stent, which had been in place for 8 weeks. The left stent was still pliable, and was removed using the alligator forceps. The lumen of the double-J stent was still intact, and through the lumen, a 0.038 guidewire was passed, under fluoroscopic control, and coiled in the renal pelvis. Retrograde pyelogram was then performed using a 6 [place] open-ended catheter, showing no true hydronephrotic ureter, but distended renal pelvis. The renal calyces were widely patent, although not a diagnosis for retrograde pyelogram.  A dual lumen catheter was then passed over the guidewire into the renal pelvis, and a second guidewire, 0.038 floppy tip guidewire, was passed without difficulty. The catheter was removed, and one wire was used as a Chiropodist. The dual lumen ureteroscope was then passed without if occult he in today ureter. At the level of the pelvic vessels, the previously identified stricture was identified again. Thickening of the ureter was identified. Previous false passage was identified. The stricture was identified and had been dilated. The dual lumen scope was passed through the area without difficulty. The scope was passed under fluoroscopic control and direct vision through the entire ureter, and no stones identified. However, at the level of the UPJ, stone was then notified covered in blood clot. This was captured in a 0 tip basket. The stone was dragged through the ureter, and did not get caught at the site of previous stricture. It  was able to be removed without using the laser. It was saved for examination. Repeat ureteroscopy was accomplished, and extraction was accomplished of organized blood clot within the renal pelvis. The renal pelvis was thoroughly examined, with irrigation of the calyces. No  further stone was identified. I elected to place a double-J stent, and leave the suture attached to the stent. This was accomplished by removing the ureteroscope, and removing one of the wires. Over the safety wire, the scope was passed, and a 2678 or by 6 [place] double-J stent with suture attached was passed, coiled in the renal pelvis under fluoroscopic control, and in the bladder by cystoscopic control. Care was taken to leave the suture attached, and this was taped to the pubis. The patient was given a B and O suppository, as well as IV Toradol. She was awakened, taken to recovery room in excellent condition.

## 2015-07-06 NOTE — Interval H&P Note (Signed)
History and Physical Interval Note:  07/06/2015 8:58 AM  [name]  has presented today for surgery, with the diagnosis of LEFT URETERAL STONE, LEFT URETERAL STRICTURE  The various methods of treatment have been discussed with the patient and family. After consideration of risks, benefits and other options for treatment, the patient has consented to  Procedure(s): CYSTOSCOPY WITH LEFT RETROGRADE PYELOGRAM, URETEROSCOPY AND STENT PLACEMENT (Left) HOLMIUM Mendoza APPLICATION (N/A) CYSTOSCOPY WITH URETHRAL DILATATION (Left) as a surgical intervention .  The patient's history has been reviewed, patient examined, no change in status, stable for surgery.  I have reviewed the patient's chart and labs.  Questions were answered to the patient's satisfaction.    Note: Pt did not tolerate Flomax, with increase in Heart Rate and decrease in her blood pressure. Flomax stopped, and symptoms abated.    [name] I Shamon Lobo

## 2015-07-06 NOTE — Anesthesia Postprocedure Evaluation (Signed)
Anesthesia Post Note  Patient: [name]  Procedure(s) Performed: Procedure(s) (LRB): CYSTOSCOPY WITH LEFT RETROGRADE PYELOGRAM AND INTERPRETATION, LEFT URETEROSCOPY WITH DUAL LUMEN PYELOSCOPY, IDENTIFCATION AND DILATION OF URETERAL STRICTURE, JJ STENT REMOVAL, BASKET EXTRACTION OF RENAL PELVIS STONE, EXTRACTION OF ORGANIZED RENAL PELVIC BLOOD CLOT (Left)  Patient location during evaluation: PACU Anesthesia Type: General Level of consciousness: awake and alert Pain management: pain level controlled Vital Signs Assessment: post-procedure vital signs reviewed and stable Respiratory status: spontaneous breathing, nonlabored ventilation, respiratory function stable and patient connected to nasal cannula oxygen Cardiovascular status: blood pressure returned to baseline and stable Postop Assessment: no signs of nausea or vomiting Anesthetic complications: no    Last Vitals:  Filed Vitals:   07/06/15 1110 07/06/15 1233  BP: 129/70 113/59  Pulse: 75 68  Temp: 36.3 C 36.7 C  Resp: 12 16    Last Pain:  Filed Vitals:   07/06/15 1239  PainSc: 1                  Adie Vilar JENNETTE

## 2015-07-06 NOTE — Discharge Instructions (Addendum)
Kidney Stones °Kidney stones (urolithiasis) are deposits that form inside your kidneys. The intense pain is caused by the stone moving through the urinary tract. When the stone moves, the ureter goes into spasm around the stone. The stone is usually passed in the urine.  °CAUSES  °· A disorder that makes certain neck glands produce too much parathyroid hormone (primary hyperparathyroidism). °· A buildup of uric acid crystals, similar to gout in your joints. °· Narrowing (stricture) of the ureter. °· A kidney obstruction present at birth (congenital obstruction). °· Previous surgery on the kidney or ureters. °· Numerous kidney infections. °SYMPTOMS  °· Feeling sick to your stomach (nauseous). °· Throwing up (vomiting). °· Blood in the urine (hematuria). °· Pain that usually spreads (radiates) to the groin. °· Frequency or urgency of urination. °DIAGNOSIS  °· Taking a history and physical exam. °· Blood or urine tests. °· CT scan. °· Occasionally, an examination of the inside of the urinary bladder (cystoscopy) is performed. °TREATMENT  °· Observation. °· Increasing your fluid intake. °· Extracorporeal shock wave lithotripsy--This is a noninvasive procedure that uses shock waves to break up kidney stones. °· Surgery may be needed if you have severe pain or persistent obstruction. There are various surgical procedures. Most of the procedures are performed with the use of small instruments. Only small incisions are needed to accommodate these instruments, so recovery time is minimized. °The size, location, and chemical composition are all important variables that will determine the proper choice of action for you. Talk to your health care provider to better understand your situation so that you will minimize the risk of injury to yourself and your kidney.  °HOME CARE INSTRUCTIONS  °· Drink enough water and fluids to keep your urine clear or pale yellow. This will help you to pass the stone or stone fragments. °· Strain  all urine through the provided strainer. Keep all particulate matter and stones for your health care provider to see. The stone causing the pain may be as small as a grain of salt. It is very important to use the strainer each and every time you pass your urine. The collection of your stone will allow your health care provider to analyze it and verify that a stone has actually passed. The stone analysis will often identify what you can do to reduce the incidence of recurrences. °· Only take over-the-counter or prescription medicines for pain, discomfort, or fever as directed by your health care provider. °· Keep all follow-up visits as told by your health care provider. This is important. °· Get follow-up X-rays if required. The absence of pain does not always mean that the stone has passed. It may have only stopped moving. If the urine remains completely obstructed, it can cause loss of kidney function or even complete destruction of the kidney. It is your responsibility to make sure X-rays and follow-ups are completed. Ultrasounds of the kidney can show blockages and the status of the kidney. Ultrasounds are not associated with any radiation and can be performed easily in a matter of minutes. °· Make changes to your daily diet as told by your health care provider. You may be told to: °¨ Limit the amount of salt that you eat. °¨ Eat 5 or more servings of fruits and vegetables each day. °¨ Limit the amount of meat, poultry, fish, and eggs that you eat. °· Collect a 24-hour urine sample as told by your health care provider. You may need to collect another urine sample every 6-12   months. SEEK MEDICAL CARE IF:  You experience pain that is progressive and unresponsive to any pain medicine you have been prescribed. SEEK IMMEDIATE MEDICAL CARE IF:   Pain cannot be controlled with the prescribed medicine.  You have a fever or shaking chills.  The severity or intensity of pain increases over 18 hours and is not  relieved by pain medicine.  You develop a new onset of abdominal pain.  You feel faint or pass out.  You are unable to urinate.   This information is not intended to replace advice given to you by your health care provider. Make sure you discuss any questions you have with your health care provider.   May remnove JJ stent in 7 days.  Document Released: 02/07/2005 Document Revised: 10/29/2014 Document Reviewed: 07/11/2012 Elsevier Interactive Patient Education [employer].

## 2015-07-27 ENCOUNTER — Ambulatory Visit (INDEPENDENT_AMBULATORY_CARE_PROVIDER_SITE_OTHER): Payer: Medicare Other | Admitting: Cardiovascular Disease

## 2015-07-27 ENCOUNTER — Encounter: Payer: Self-pay | Admitting: Cardiovascular Disease

## 2015-07-27 VITALS — BP 140/80 | HR 68 | Ht 63.0 in | Wt 156.8 lb

## 2015-07-27 DIAGNOSIS — I6523 Occlusion and stenosis of bilateral carotid arteries: Secondary | ICD-10-CM | POA: Diagnosis not present

## 2015-07-27 DIAGNOSIS — I340 Nonrheumatic mitral (valve) insufficiency: Secondary | ICD-10-CM | POA: Diagnosis not present

## 2015-07-27 DIAGNOSIS — E785 Hyperlipidemia, unspecified: Secondary | ICD-10-CM

## 2015-07-27 DIAGNOSIS — I10 Essential (primary) hypertension: Secondary | ICD-10-CM | POA: Diagnosis not present

## 2015-07-27 DIAGNOSIS — I493 Ventricular premature depolarization: Secondary | ICD-10-CM

## 2015-07-27 NOTE — Progress Notes (Signed)
Chief Complaint  Patient presents with  . Cardiomyopathy    no cp,sob,lee,or claudication      History of Present Illness: 75 yo WF with history of severe anxiety, PVCs, hyperlipidemia, HTN, and multiple GI issues including diverticulosis, PUD and GERD along with IBS and fibromyalgia who is here today for cardiac follow up. She was seen as a new patient for further evaluation of palpitations on 05/18/09. She has had known PVCs for many years but no other arrhythmias. Echo in October 2011 with mild global hypokinesis with EF of 45-50%. Her 48 hour Holter monitor showed frequent PVCs with some trigeminy and bigeminy. Her beta blocker was increased and she cut out caffeine.  She has been on Lisinopril. In May 2013, the patient was cleaning her mother's house in [place] and developed 6 days of dizziness with moving around and ringing in her ears. She started keeping track of her blood pressure and it would fluctuate between 105/60 and 143/93. She was diagnosed with vertigo and she was placed on meclizine. CT of her brain was normal. Repeat echo 10/12 with LVEF 55%, mild LVH, mild MR, mild AI. She was seen in the emergency room 09/22/13 with dizziness associated with diaphoresis and near syncope while picking up sticks in her yard. EKG in the emergency room demonstrated sinus rhythm with frequent PVCs and no significant ST changes. She was seen in our office by Richardson Dopp, PA-C on 09/24/13. Stress myoview normal 09/26/13. 30 day event monitor with PVCs but no SVT, atrial fib, VT. No pauses or bradycardia. Metoprolol was changed to Cardizem. She was seen by Dr. Caryl Comes 12/12/13 and no changes were made. He reassured her that her PVCs are not dangerous. Most recent echo March 2016 with normal LV systolic function, mild MR. Carotid artery dopplers March 2016 with mild bilateral disease.   She is here today for cardiac follow up. No chest pain or SOB. BP has been well controlled at home. HR has been well  controlled. Rare palpitations. She has had no dizziness, near syncope or syncope. She brings a 3 page paper today outlining her urology issues and recent treatment by Dr. Gaynelle Arabian. She is feeling much better after recent urology procedure.   Primary Care Physician: Jerlyn Ly, MD   Past Medical History  Diagnosis Date  . TIA (transient ischemic attack) summer 2015  . Osteoporosis   . Anxiety   . PUD (peptic ulcer disease)   . Appendicitis   . Hx of adenomatous colonic polyps   . Diverticulitis   . GERD (gastroesophageal reflux disease)   . IBS (irritable bowel syndrome)   . Hypertension   . Thyroid disease   . Back abscess   . PVC (premature ventricular contraction)   . Hx of cardiovascular stress test     ETT-Myoview (8/15): Poor exercise capacity, no ischemia, EF 61%-normal study  . History of kidney stones     first stone now  . Lichen planus   . Fibromyalgia     Past Surgical History  Procedure Laterality Date  . Appendectomy    . Wrist surgery      left  . Carpal tunnel release      left  . Cystoscopy/retrograde/ureteroscopy/stone extraction with basket Left 05/08/2015    Procedure: CYSTOSCOPY/RETROGRADE/URETEROSCOPY/JJ STENT PLACEMENT;  Surgeon: Carolan Clines, MD;  Location: [facility];  Service: Urology;  Laterality: Left;  . Abdominal hysterectomy      complete  . Cystoscopy with retrograde pyelogram, ureteroscopy and stent placement Left  07/06/2015    Procedure: CYSTOSCOPY WITH LEFT RETROGRADE PYELOGRAM AND INTERPRETATION, LEFT URETEROSCOPY WITH DUAL LUMEN PYELOSCOPY, IDENTIFCATION AND DILATION OF URETERAL STRICTURE, JJ STENT REMOVAL, BASKET EXTRACTION OF RENAL PELVIS STONE, EXTRACTION OF ORGANIZED RENAL PELVIC BLOOD CLOT;  Surgeon: Carolan Clines, MD;  Location: [facility];  Service: Urology;  Laterality: Left;  PLACEMENT OF LEFT DOUBLE J STENT    Current Outpatient Prescriptions  Medication Sig Dispense Refill  . acetaminophen (TYLENOL) 500 MG tablet Take  500 mg by mouth every 6 (six) hours as needed (pain).     Marland Kitchen alum & mag hydroxide-simeth (MAALOX PLUS) 400-400-40 MG/5ML suspension Take 5 mLs by mouth every 6 (six) hours as needed for indigestion.    Marland Kitchen aspirin EC 81 MG tablet Take 1 tablet (81 mg total) by mouth daily. 30 tablet 3  . calcium carbonate (TUMS - DOSED IN MG ELEMENTAL CALCIUM) 500 MG chewable tablet Chew 1 tablet by mouth daily as needed for indigestion or heartburn.    . desoximetasone (TOPICORT) 0.25 % cream Apply 1 application topically 2 (two) times daily as needed (lichen planus on legs).     Marland Kitchen dicyclomine (BENTYL) 10 MG capsule Take 10 mg by mouth daily as needed for spasms.     Marland Kitchen diltiazem (CARDIZEM CD) 120 MG 24 hr capsule TAKE 1 CAPSULE (120 MG TOTAL) BY MOUTH DAILY. 90 capsule 0  . ENSURE PLUS (ENSURE PLUS) LIQD Take 237 mLs by mouth daily.    . ergocalciferol (VITAMIN D2) 50000 UNITS capsule Take 50,000 Units by mouth See admin instructions. Takes one capsule on weeks 1 through 3 of each month on Saturdays and then one capsule on week 4 on Mondays and Thursdays. (for a total of 5 capsules per month).    . escitalopram (LEXAPRO) 10 MG tablet Take 5 mg by mouth daily.     Marland Kitchen esomeprazole (NEXIUM) 20 MG capsule Take 20 mg by mouth at bedtime.    . Lactobacillus (PROBIOTIC ACIDOPHILUS PO) Take 1 Dose by mouth daily as needed (intestinal flares).     . meclizine (ANTIVERT) 25 MG tablet Take 25 mg by mouth daily as needed for dizziness.    . meloxicam (MOBIC) 15 MG tablet Take 1 tablet (15 mg total) by mouth daily. 30 tablet 0  . oxybutynin (DITROPAN) 5 MG tablet Stop if drug  causes constipation or confusion May take 2x/day for bladder spasms 30 tablet 0  . polyethylene glycol (MIRALAX / GLYCOLAX) packet Take 17 g by mouth as needed for mild constipation.     . pravastatin (PRAVACHOL) 20 MG tablet Take 20 mg by mouth at bedtime.     No current facility-administered medications for this visit.    Allergies  Allergen Reactions   . Penicillins Swelling    By second day there was swelling of tongue and could not breathe Has patient had a PCN reaction causing immediate rash, facial/tongue/throat swelling, SOB or lightheadedness with hypotension: yes Has patient had a PCN reaction causing severe rash involving mucus membranes or skin necrosis: no Has patient had a PCN reaction that required hospitalization no Has patient had a PCN reaction occurring within the last 10 years:no If all of the above answers are "NO", then may proceed with Cephalosporin use  . Sulfonamide Derivatives Hives and Rash    Where applied.  . Iodinated Diagnostic Agents Other (See Comments)    Unknown allergy to unknown contrast during gb test many yrs ago//a.calhoun  . Metronidazole Diarrhea and Other (See Comments)  Hyper, decreases appetite  . Prednisone Other (See Comments)    Due to cataract on eye and history of PVC's    Social History   Social History  . Marital Status: Widowed    Spouse Name: N/A  . Number of Children: N/A  . Years of Education: N/A   Occupational History  . Not on file.   Social History Main Topics  . Smoking status: Never Smoker   . Smokeless tobacco: Never Used  . Alcohol Use: No  . Drug Use: No  . Sexual Activity: Not on file   Other Topics Concern  . Not on file   Social History Narrative    Family History  Problem Relation Age of Onset  . Heart attack Maternal Grandfather 72  . Glaucoma Mother   . Macular degeneration Mother   . Lung cancer Father   . Lung cancer Brother   . Tuberculosis Paternal Grandfather   . Colon cancer Paternal Grandmother 61  . Heart attack Father   . Cancer Father   . Stroke Mother   . Hypertension Mother   . Hypertension Brother   . Diabetes Brother   . Cancer Brother   . Cancer    . Cancer Paternal Grandmother     Review of Systems:  As stated in the HPI and otherwise negative.   BP 140/80 mmHg  Pulse 68  Ht 5\' 3"  (1.6 m)  Wt 156 lb 12.8 oz  (71.124 kg)  BMI 27.78 kg/m2  Physical Examination: General: Well developed, well nourished, NAD HEENT: OP clear, mucus membranes moist SKIN: warm, dry. No rashes. Neuro: No focal deficits Musculoskeletal: Muscle strength 5/5 all ext Psychiatric: Mood and affect normal Neck: No JVD, no carotid bruits, no thyromegaly, no lymphadenopathy. Lungs:Clear bilaterally, no wheezes, rhonci, crackles Cardiovascular: Regular rate and rhythm. No murmurs, gallops or rubs. Abdomen:Soft. Bowel sounds present. Non-tender.  Extremities: No lower extremity edema. Pulses are 2 + in the bilateral DP/PT.  Echo March 2016: Left ventricle: The cavity size was normal. Systolic function was normal. The estimated ejection fraction was in the range of 60% to 65%. Wall motion was normal; there were no regional wall motion abnormalities. Doppler parameters are consistent with abnormal left ventricular relaxation (grade 1 diastolic dysfunction). - Aortic valve: There was mild regurgitation. - Mitral valve: There was mild regurgitation.  Impressions:  - Compared to the prior study, there has been no significant interval change.  EKG:  EKG is not ordered today. The ekg ordered today demonstrates   Recent Labs: 04/28/2015: ALT 26 07/02/2015: BUN 12; Creatinine, Ser 0.88; Hemoglobin 12.5; Platelets 253; Potassium 3.8; Sodium 145   Lipid Panel No results found for: CHOL, TRIG, HDL, CHOLHDL, VLDL, LDLCALC, LDLDIRECT   Wt Readings from Last 3 Encounters:  07/27/15 156 lb 12.8 oz (71.124 kg)  07/06/15 154 lb (69.854 kg)  07/02/15 154 lb 3.2 oz (69.945 kg)     Other studies Reviewed: Additional studies/ records that were reviewed today include: . Review of the above records demonstrates:    Assessment and Plan:   1. PVCs: Rare palpitations now on Cardizem. She has seen Dr. Caryl Comes in the EP clinic and he agreed with Cardizem therapy without further workup or addition of anti-arrhythmic therapy.  I think her PVCs are driven by anxiety. Will continue Cardizem CD 120 mg daily. I have once again reassured her that her PVCs are benign. I have answered all of her questions today.   2. HLD: Followed in primary care.  3. HTN: BP is controlled. No changes today.   4. Mitral regurgitation: Mild by echo March 2016.  Repeat March 2019.  6. Carotid artery disease: Mild (0-39% bilaterally) by dopplers March 2016.   Current medicines are reviewed at length with the patient today.  The patient does not have concerns regarding medicines.  The following changes have been made:  no change  Labs/ tests ordered today include:  No orders of the defined types were placed in this encounter.    Disposition:   FU with me in 12  months  Signed, Lauree Chandler, MD 07/27/2015 3:28 PM    [facility] [address], [place], [place]  [address] Phone: [phone]; Fax: [phone]

## 2015-07-27 NOTE — Patient Instructions (Signed)

## 2015-08-28 ENCOUNTER — Other Ambulatory Visit: Payer: Self-pay | Admitting: *Deleted

## 2015-08-28 MED ORDER — DILTIAZEM HCL ER COATED BEADS 120 MG PO CP24: ORAL_CAPSULE | ORAL | Status: DC

## 2015-09-12 ENCOUNTER — Other Ambulatory Visit: Payer: Self-pay | Admitting: Cardiovascular Disease

## 2015-09-16 NOTE — Telephone Encounter (Signed)
OK to refill

## 2016-04-27 ENCOUNTER — Other Ambulatory Visit: Payer: Self-pay | Admitting: Internal Medicine

## 2016-04-27 DIAGNOSIS — Z1231 Encounter for screening mammogram for malignant neoplasm of breast: Secondary | ICD-10-CM

## 2016-05-18 ENCOUNTER — Ambulatory Visit
Admission: RE | Admit: 2016-05-18 | Discharge: 2016-05-18 | Disposition: A | Discharge status: Home / Self Care | Payer: Medicare Other | Source: Ambulatory Visit | Attending: Internal Medicine | Admitting: Internal Medicine

## 2016-05-18 DIAGNOSIS — Z1231 Encounter for screening mammogram for malignant neoplasm of breast: Secondary | ICD-10-CM

## 2016-08-13 ENCOUNTER — Other Ambulatory Visit: Payer: Self-pay | Admitting: Cardiovascular Disease

## 2016-09-02 ENCOUNTER — Ambulatory Visit: Payer: Medicare Other | Admitting: Cardiovascular Disease

## 2016-10-26 ENCOUNTER — Ambulatory Visit (INDEPENDENT_AMBULATORY_CARE_PROVIDER_SITE_OTHER): Payer: Medicare Other | Admitting: Cardiovascular Disease

## 2016-10-26 ENCOUNTER — Encounter: Payer: Self-pay | Admitting: Cardiovascular Disease

## 2016-10-26 VITALS — BP 112/70 | HR 72 | Ht 63.0 in | Wt 167.8 lb

## 2016-10-26 DIAGNOSIS — I340 Nonrheumatic mitral (valve) insufficiency: Secondary | ICD-10-CM | POA: Diagnosis not present

## 2016-10-26 DIAGNOSIS — I493 Ventricular premature depolarization: Secondary | ICD-10-CM | POA: Diagnosis not present

## 2016-10-26 DIAGNOSIS — I10 Essential (primary) hypertension: Secondary | ICD-10-CM

## 2016-10-26 NOTE — Patient Instructions (Signed)
Medication Instructions:  Your physician recommends that you continue on your current medications as directed. Please refer to the Current Medication list given to you today.   Labwork: none  Testing/Procedures: Your physician has requested that you have an echocardiogram. Echocardiography is a painless test that uses sound waves to create images of your heart. It provides your doctor with information about the size and shape of your heart and how well your heart's chambers and valves are working. This procedure takes approximately one hour. There are no restrictions for this procedure.  To be done in March 2019  Follow-Up: Your physician recommends that you schedule a follow-up appointment in: 12 months.  Please call our office in about 9 months to schedule this appointment.     Any Other Special Instructions Will Be Listed Below (If Applicable).     If you need a refill on your cardiac medications before your next appointment, please call your pharmacy.

## 2016-10-26 NOTE — Progress Notes (Signed)
Chief Complaint  Patient presents with  . Follow-up    PVCs   History of Present Illness: 76 yo WF with history of severe anxiety, PVCs, hyperlipidemia, HTN, and multiple GI issues including diverticulosis, PUD and GERD along with IBS and fibromyalgia who is here today for cardiac follow up. She is followed in my office for PVCs. I saw her in 2011 for evaluation of palpitations. 48 hour Holter monitor in 2011 showed frequent PVCs with some trigeminy. Echo March 2016 with LVEF 60%, mild MR, mild AI. Stress myoview in August 2015 with no ischemia. 30 day event monitor August 2015 with PVCs but no SVT, atrial fib, VT. No pauses or bradycardia. Metoprolol was changed to Cardizem. She was seen by Dr. Caryl Comes 12/12/13 and no changes were made. He reassured her that her PVCs are not dangerous. Carotid artery dopplers March 2016 with mild bilateral disease.   She is here today for follow up. The patient denies any chest pain, dyspnea, palpitations, lower extremity edema, orthopnea, PND, dizziness, near syncope or syncope.    Primary Care Physician: Crist Infante, MD   Past Medical History:  Diagnosis Date  . Anxiety   . Appendicitis   . Back abscess   . Diverticulitis   . Fibromyalgia   . GERD (gastroesophageal reflux disease)   . History of kidney stones    first stone now  . Hx of adenomatous colonic polyps   . Hx of cardiovascular stress test    ETT-Myoview (8/15): Poor exercise capacity, no ischemia, EF 61%-normal study  . Hypertension   . IBS (irritable bowel syndrome)   . Lichen planus   . Osteoporosis   . PUD (peptic ulcer disease)   . PVC (premature ventricular contraction)   . Thyroid disease   . TIA (transient ischemic attack) summer 2015    Past Surgical History:  Procedure Laterality Date  . ABDOMINAL HYSTERECTOMY     complete  . APPENDECTOMY    . CARPAL TUNNEL RELEASE     left  . CYSTOSCOPY WITH RETROGRADE PYELOGRAM, URETEROSCOPY AND STENT PLACEMENT Left 07/06/2015   Procedure: CYSTOSCOPY WITH LEFT RETROGRADE PYELOGRAM AND INTERPRETATION, LEFT URETEROSCOPY WITH DUAL LUMEN PYELOSCOPY, IDENTIFCATION AND DILATION OF URETERAL STRICTURE, JJ STENT REMOVAL, BASKET EXTRACTION OF RENAL PELVIS STONE, EXTRACTION OF ORGANIZED RENAL PELVIC BLOOD CLOT;  Surgeon: Carolan Clines, MD;  Location: [facility];  Service: Urology;  Laterality: Left;  PLACEMENT OF LEFT DOUBLE J STENT  . CYSTOSCOPY/RETROGRADE/URETEROSCOPY/STONE EXTRACTION WITH BASKET Left 05/08/2015   Procedure: CYSTOSCOPY/RETROGRADE/URETEROSCOPY/JJ STENT PLACEMENT;  Surgeon: Carolan Clines, MD;  Location: [facility];  Service: Urology;  Laterality: Left;  . WRIST SURGERY     left    Current Outpatient Prescriptions  Medication Sig Dispense Refill  . acetaminophen (TYLENOL) 500 MG tablet Take 500 mg by mouth every 6 (six) hours as needed (pain).     Marland Kitchen alum & mag hydroxide-simeth (MAALOX PLUS) 400-400-40 MG/5ML suspension Take 5 mLs by mouth every 6 (six) hours as needed for indigestion.    Marland Kitchen aspirin EC 81 MG tablet Take 1 tablet (81 mg total) by mouth daily. 30 tablet 3  . calcium carbonate (TUMS - DOSED IN MG ELEMENTAL CALCIUM) 500 MG chewable tablet Chew 1 tablet by mouth daily as needed for indigestion or heartburn.    . desoximetasone (TOPICORT) 0.25 % cream Apply 1 application topically 2 (two) times daily as needed (lichen planus on legs).     Marland Kitchen dicyclomine (BENTYL) 10 MG capsule Take 10 mg by mouth daily  as needed for spasms.     Marland Kitchen diltiazem (CARDIZEM CD) 120 MG 24 hr capsule Take 1 capsule (120 mg total) by mouth daily. 90 capsule 0  . ENSURE PLUS (ENSURE PLUS) LIQD Take 350 mLs by mouth daily.     . ergocalciferol (VITAMIN D2) 50000 UNITS capsule Take 50,000 Units by mouth See admin instructions. Takes one capsule on weeks 1 through 3 of each month on Saturdays and then one capsule on week 4 on Mondays and Thursdays. (for a total of 5 capsules per month).    . escitalopram (LEXAPRO) 10 MG tablet Take 5 mg by  mouth daily.     Marland Kitchen esomeprazole (NEXIUM) 20 MG capsule Take 20 mg by mouth at bedtime.    . meclizine (ANTIVERT) 25 MG tablet Take 1 tablet (25 mg total) by mouth daily as needed for dizziness or nausea. 90 tablet 3  . polyethylene glycol (MIRALAX / GLYCOLAX) packet Take 17 g by mouth as needed for mild constipation.     . pravastatin (PRAVACHOL) 20 MG tablet Take 20 mg by mouth at bedtime.     No current facility-administered medications for this visit.     Allergies  Allergen Reactions  . Penicillins Swelling    By second day there was swelling of tongue and could not breathe Has patient had a PCN reaction causing immediate rash, facial/tongue/throat swelling, SOB or lightheadedness with hypotension: yes Has patient had a PCN reaction causing severe rash involving mucus membranes or skin necrosis: no Has patient had a PCN reaction that required hospitalization no Has patient had a PCN reaction occurring within the last 10 years:no If all of the above answers are "NO", then may proceed with Cephalosporin use  . Sulfonamide Derivatives Hives and Rash    Where applied.  . Iodinated Diagnostic Agents Other (See Comments)    Unknown allergy to unknown contrast during gb test many yrs ago//a.calhoun  . Metronidazole Diarrhea and Other (See Comments)    Hyper, decreases appetite  . Prednisone Other (See Comments)    Due to cataract on eye and history of PVC's    Social History   Social History  . Marital status: Widowed    Spouse name: N/A  . Number of children: N/A  . Years of education: N/A   Occupational History  . Not on file.   Social History Main Topics  . Smoking status: Never Smoker  . Smokeless tobacco: Never Used  . Alcohol use No  . Drug use: No  . Sexual activity: Not on file   Other Topics Concern  . Not on file   Social History Narrative  . No narrative on file    Family History  Problem Relation Age of Onset  . Glaucoma Mother   . Macular degeneration  Mother   . Stroke Mother   . Hypertension Mother   . Lung cancer Father   . Heart attack Father   . Cancer Father   . Lung cancer Brother   . Tuberculosis Paternal Grandfather   . Colon cancer Paternal Grandmother 62  . Cancer Paternal Grandmother   . Heart attack Maternal Grandfather 72  . Hypertension Brother   . Diabetes Brother   . Cancer Brother   . Cancer Unknown     Review of Systems:  As stated in the HPI and otherwise negative.   BP 112/70   Pulse 72   Ht 5\' 3"  (1.6 m)   Wt 167 lb 12.8 oz (76.1 kg)  SpO2 95%   BMI 29.72 kg/m   Physical Examination:  General: Well developed, well nourished, NAD  HEENT: OP clear, mucus membranes moist  SKIN: warm, dry. No rashes. Neuro: No focal deficits  Musculoskeletal: Muscle strength 5/5 all ext  Psychiatric: Mood and affect normal  Neck: No JVD, no carotid bruits, no thyromegaly, no lymphadenopathy.  Lungs:Clear bilaterally, no wheezes, rhonci, crackles Cardiovascular: Regular rate and rhythm. No murmurs, gallops or rubs. Abdomen:Soft. Bowel sounds present. Non-tender.  Extremities: No lower extremity edema. Pulses are 2 + in the bilateral DP/PT.  Echo March 2016: Left ventricle: The cavity size was normal. Systolic function was normal. The estimated ejection fraction was in the range of 60% to 65%. Wall motion was normal; there were no regional wall motion abnormalities. Doppler parameters are consistent with abnormal left ventricular relaxation (grade 1 diastolic dysfunction). - Aortic valve: There was mild regurgitation. - Mitral valve: There was mild regurgitation.  Impressions:  - Compared to the prior study, there has been no significant interval change.  EKG:  EKG is ordered today. The ekg ordered today demonstrates NSR, rate 62 bpm. 1st degree AV block. LAFB.   Recent Labs: No results found for requested labs within last 8760 hours.   Lipid Panel No results found for: CHOL, TRIG, HDL,  CHOLHDL, VLDL, LDLCALC, LDLDIRECT   Wt Readings from Last 3 Encounters:  10/26/16 167 lb 12.8 oz (76.1 kg)  07/27/15 156 lb 12.8 oz (71.1 kg)  07/06/15 154 lb (69.9 kg)     Other studies Reviewed: Additional studies/ records that were reviewed today include: . Review of the above records demonstrates:    Assessment and Plan:   1. PVCs: She is doing well with rare palpitations. Will continue Cardizem.   2. HTN: BP controlled. No changes.   3. Mitral regurgitation: Mild by echo in 2016. Repeat in March 2019.   4. Aortic valve insufficiency: Mild by echo in 2016. Repeat echo March 2019.   5. Carotid artery disease: Mild bilateral disease by dopplers March 2016. Will repeat in 2019.    Current medicines are reviewed at length with the patient today.  The patient does not have concerns regarding medicines.  The following changes have been made:  no change  Labs/ tests ordered today include:   Orders Placed This Encounter  Procedures  . EKG 12-Lead  . ECHOCARDIOGRAM COMPLETE    Disposition:   FU with me in 12  months  Signed, Lauree Chandler, MD 10/26/2016 1:02 PM    [facility] [address], [place], [place]  [address] Phone: [phone]; Fax: [phone]

## 2016-11-10 ENCOUNTER — Other Ambulatory Visit: Payer: Self-pay | Admitting: Cardiovascular Disease

## 2017-04-26 ENCOUNTER — Other Ambulatory Visit: Payer: Self-pay

## 2017-04-26 ENCOUNTER — Ambulatory Visit (HOSPITAL_COMMUNITY): Payer: Medicare Other | Attending: Cardiology

## 2017-04-26 DIAGNOSIS — E785 Hyperlipidemia, unspecified: Secondary | ICD-10-CM | POA: Diagnosis not present

## 2017-04-26 DIAGNOSIS — I351 Nonrheumatic aortic (valve) insufficiency: Secondary | ICD-10-CM | POA: Diagnosis not present

## 2017-04-26 DIAGNOSIS — I493 Ventricular premature depolarization: Secondary | ICD-10-CM | POA: Diagnosis not present

## 2017-04-26 DIAGNOSIS — I340 Nonrheumatic mitral (valve) insufficiency: Secondary | ICD-10-CM | POA: Diagnosis not present

## 2017-04-26 DIAGNOSIS — I429 Cardiomyopathy, unspecified: Secondary | ICD-10-CM | POA: Insufficient documentation

## 2017-06-19 ENCOUNTER — Other Ambulatory Visit: Payer: Self-pay | Admitting: Internal Medicine

## 2017-06-19 DIAGNOSIS — Z1231 Encounter for screening mammogram for malignant neoplasm of breast: Secondary | ICD-10-CM

## 2017-07-07 ENCOUNTER — Ambulatory Visit: Payer: Medicare Other

## 2017-08-08 ENCOUNTER — Ambulatory Visit: Payer: Medicare Other

## 2017-08-23 ENCOUNTER — Ambulatory Visit: Payer: Medicare Other

## 2017-09-08 ENCOUNTER — Encounter: Payer: Self-pay | Admitting: Gastroenterology

## 2017-09-11 ENCOUNTER — Ambulatory Visit
Admission: RE | Admit: 2017-09-11 | Discharge: 2017-09-11 | Disposition: A | Discharge status: Home / Self Care | Payer: Medicare Other | Source: Ambulatory Visit | Attending: Internal Medicine | Admitting: Internal Medicine

## 2017-09-11 DIAGNOSIS — Z1231 Encounter for screening mammogram for malignant neoplasm of breast: Secondary | ICD-10-CM

## 2017-10-07 ENCOUNTER — Other Ambulatory Visit: Payer: Self-pay

## 2017-10-07 ENCOUNTER — Encounter (HOSPITAL_COMMUNITY): Payer: Self-pay | Admitting: Emergency Medicine

## 2017-10-07 ENCOUNTER — Emergency Department (HOSPITAL_COMMUNITY)
Admission: EM | Admit: 2017-10-07 | Discharge: 2017-10-07 | Disposition: A | Discharge status: Home / Self Care | Payer: Medicare Other | Attending: Emergency Medicine | Admitting: Emergency Medicine

## 2017-10-07 DIAGNOSIS — R51 Headache: Secondary | ICD-10-CM | POA: Diagnosis not present

## 2017-10-07 DIAGNOSIS — E079 Disorder of thyroid, unspecified: Secondary | ICD-10-CM | POA: Diagnosis not present

## 2017-10-07 DIAGNOSIS — I10 Essential (primary) hypertension: Secondary | ICD-10-CM | POA: Diagnosis not present

## 2017-10-07 DIAGNOSIS — R002 Palpitations: Secondary | ICD-10-CM

## 2017-10-07 DIAGNOSIS — Z79899 Other long term (current) drug therapy: Secondary | ICD-10-CM | POA: Insufficient documentation

## 2017-10-07 DIAGNOSIS — R519 Headache, unspecified: Secondary | ICD-10-CM

## 2017-10-07 LAB — COMPREHENSIVE METABOLIC PANEL
ALK PHOS: 48 U/L (ref 38–126)
ALT: 21 U/L (ref 0–44)
AST: 20 U/L (ref 15–41)
Albumin: 3.6 g/dL (ref 3.5–5.0)
Anion gap: 11 (ref 5–15)
BILIRUBIN TOTAL: 0.6 mg/dL (ref 0.3–1.2)
BUN: 11 mg/dL (ref 8–23)
CALCIUM: 9.1 mg/dL (ref 8.9–10.3)
CHLORIDE: 108 mmol/L (ref 98–111)
CO2: 22 mmol/L (ref 22–32)
CREATININE: 0.85 mg/dL (ref 0.44–1.00)
GFR calc Af Amer: >60 mL/min (ref >60)
Glucose, Bld: 102 mg/dL — ABNORMAL HIGH (ref 70–99)
Potassium: 4 mmol/L (ref 3.5–5.1)
Sodium: 141 mmol/L (ref 135–145)
Total Protein: 6.7 g/dL (ref 6.5–8.1)

## 2017-10-07 LAB — CBC
HEMATOCRIT: 46.5 % — AB (ref 36.0–46.0)
HEMOGLOBIN: 14.6 g/dL (ref 12.0–15.0)
MCH: 29.9 pg (ref 26.0–34.0)
MCHC: 31.4 g/dL (ref 30.0–36.0)
MCV: 95.3 fL (ref 78.0–100.0)
PLATELETS: 220 10*3/uL (ref 150–400)
RBC: 4.88 MIL/uL (ref 3.87–5.11)
RDW: 12.4 % (ref 11.5–15.5)
WBC: 7.4 10*3/uL (ref 4.0–10.5)

## 2017-10-07 LAB — TROPONIN I

## 2017-10-07 MED ORDER — SODIUM CHLORIDE 0.9 % IV BOLUS
500.0000 mL | Freq: Once | INTRAVENOUS | Status: AC
  Administered 2017-10-07: 500 mL via INTRAVENOUS

## 2017-10-07 NOTE — ED Triage Notes (Signed)
Pt. Came in by EMS from home with reports of feeling palpitations and her BP has been high. Pt. Reports taking her BP at home and it read over 072 systolic. Pt. Has hx. Of hypertension, anxiety, and afib. Currently taking cardizem. Pt. Also reports weakness and a headache X3 days and fell a couple days ago. Fall was unwitnessed and pt. Reports hitting head but did not lose consciousness. A/O X4

## 2017-10-07 NOTE — ED Notes (Signed)
ED Provider at bedside. 

## 2017-10-07 NOTE — ED Provider Notes (Signed)
[facility] EMERGENCY DEPARTMENT Provider Note   CSN: [id number] Arrival date & time: 10/07/17  4081     History   Chief Complaint No chief complaint on file.   HPI [name] is a 77 y.o. female.  HPI 77 year old female presents the emergency department complaints of palpitations which began tonight as well as elevated blood pressure.  Her blood pressure was reading over 448 systolic.  She reports a history of atrial fibrillation but she is normally in sinus rhythm.  She does wear a new aged apple watch and her watch did not states she had any A. fib.  She reports headache and generalized weakness over the past 2 to 3 days.  Denies unilateral arm or leg weakness.  Headache is mild.  Minor head injury several days ago.  No vomiting.  No nausea.  No fevers or chills.  Symptoms are mild in severity.  No chest pain or shortness of breath.  She does have a history of PVCs per the medical record.  She is compliant with her medications.  Headache is mild in severity at this time    Past Medical History:  Diagnosis Date  . Anxiety   . Appendicitis   . Back abscess   . Diverticulitis   . Fibromyalgia   . GERD (gastroesophageal reflux disease)   . History of kidney stones    first stone now  . Hx of adenomatous colonic polyps   . Hx of cardiovascular stress test    ETT-Myoview (8/15): Poor exercise capacity, no ischemia, EF 61%-normal study  . Hypertension   . IBS (irritable bowel syndrome)   . Lichen planus   . Osteoporosis   . PUD (peptic ulcer disease)   . PVC (premature ventricular contraction)   . Thyroid disease   . TIA (transient ischemic attack) summer 2015    Patient Active Problem List   Diagnosis Date Noted  . Non-rheumatic mitral regurgitation 10/26/2016  . ATRIAL PREMATURE BEATS 06/17/2009  . CARDIOMYOPATHY, SECONDARY 06/09/2009  . PALPITATIONS 05/18/2009  . HYPERCHOLESTEROLEMIA 07/17/2007  . ANXIETY 07/17/2007  . PREMATURE  VENTRICULAR CONTRACTIONS 07/17/2007  . GERD 07/17/2007  . PUD 07/17/2007  . DIVERTICULITIS OF COLON 07/17/2007  . OSTEOPOROSIS 07/17/2007  . SLEEP DISORDER 07/17/2007  . COLONIC POLYPS, HX OF 07/17/2007  . APPENDICITIS, HX OF 07/17/2007    Past Surgical History:  Procedure Laterality Date  . ABDOMINAL HYSTERECTOMY     complete  . APPENDECTOMY    . CARPAL TUNNEL RELEASE     left  . CYSTOSCOPY WITH RETROGRADE PYELOGRAM, URETEROSCOPY AND STENT PLACEMENT Left 07/06/2015   Procedure: CYSTOSCOPY WITH LEFT RETROGRADE PYELOGRAM AND INTERPRETATION, LEFT URETEROSCOPY WITH DUAL LUMEN PYELOSCOPY, IDENTIFCATION AND DILATION OF URETERAL STRICTURE, JJ STENT REMOVAL, BASKET EXTRACTION OF RENAL PELVIS STONE, EXTRACTION OF ORGANIZED RENAL PELVIC BLOOD CLOT;  Surgeon: Carolan Clines, MD;  Location: [facility];  Service: Urology;  Laterality: Left;  PLACEMENT OF LEFT DOUBLE J STENT  . CYSTOSCOPY/RETROGRADE/URETEROSCOPY/STONE EXTRACTION WITH BASKET Left 05/08/2015   Procedure: CYSTOSCOPY/RETROGRADE/URETEROSCOPY/JJ STENT PLACEMENT;  Surgeon: Carolan Clines, MD;  Location: [facility];  Service: Urology;  Laterality: Left;  . WRIST SURGERY     left     OB History   None      Home Medications    Prior to Admission medications   Medication Sig Start Date End Date Taking? Authorizing Provider  acetaminophen (TYLENOL) 500 MG tablet Take 500-1,000 mg by mouth every 6 (six) hours as needed (pain).  Yes [provider]  alum & mag hydroxide-simeth (MAALOX PLUS) 400-400-40 MG/5ML suspension Take 5 mLs by mouth every 6 (six) hours as needed for indigestion.   Yes [provider]  aspirin EC 81 MG tablet Take 1 tablet (81 mg total) by mouth daily. 12/18/12  Yes Burnell Blanks, MD  calcium carbonate (TUMS - DOSED IN MG ELEMENTAL CALCIUM) 500 MG chewable tablet Chew 1 tablet by mouth daily as needed for indigestion or heartburn.   Yes [provider]  desoximetasone (TOPICORT)  0.25 % cream Apply 1 application topically 2 (two) times daily as needed (lichen planus on legs).    Yes [provider]  dicyclomine (BENTYL) 10 MG capsule Take 10 mg by mouth daily as needed for spasms.  07/26/11  Yes Lafayette Dragon, MD  diltiazem (CARDIZEM CD) 120 MG 24 hr capsule TAKE 1 CAPSULE BY MOUTH EVERY DAY 11/10/16  Yes Burnell Blanks, MD  ENSURE PLUS (ENSURE PLUS) LIQD Take 350 mLs by mouth daily.    Yes [provider]  ergocalciferol (VITAMIN D2) 50000 UNITS capsule Take 50,000 Units by mouth See admin instructions. Takes one capsule on weeks 1 through 3 of each month on Saturdays and then one capsule on week 4 on Mondays and Thursdays. (for a total of 5 capsules per month).   Yes [provider]  escitalopram (LEXAPRO) 10 MG tablet Take 5 mg by mouth daily.    Yes [provider]  esomeprazole (NEXIUM) 20 MG capsule Take 20 mg by mouth at bedtime.   Yes [provider]  meclizine (ANTIVERT) 25 MG tablet Take 1 tablet (25 mg total) by mouth daily as needed for dizziness or nausea. 09/16/15  Yes Burnell Blanks, MD  polyethylene glycol (MIRALAX / GLYCOLAX) packet Take 17 g by mouth as needed for mild constipation.    Yes [provider]  pravastatin (PRAVACHOL) 20 MG tablet Take 20 mg by mouth at bedtime.   Yes [provider]    Family History Family History  Problem Relation Age of Onset  . Glaucoma Mother   . Macular degeneration Mother   . Stroke Mother   . Hypertension Mother   . Lung cancer Father   . Heart attack Father   . Cancer Father   . Lung cancer Brother   . Tuberculosis Paternal Grandfather   . Colon cancer Paternal Grandmother 39  . Cancer Paternal Grandmother   . Heart attack Maternal Grandfather 72  . Hypertension Brother   . Diabetes Brother   . Cancer Brother   . Cancer Unknown   . Breast cancer Neg Hx     Social History Social History   Tobacco Use  . Smoking status:  Never Smoker  . Smokeless tobacco: Never Used  Substance Use Topics  . Alcohol use: No  . Drug use: No     Allergies   Penicillins; Sulfonamide derivatives; Iodinated diagnostic agents; Metronidazole; and Prednisone   Review of Systems Review of Systems  All other systems reviewed and are negative.    Physical Exam Updated Vital Signs BP 134/72   Pulse 66   Temp 97.6 F (36.4 C) (Oral)   Resp 13   Ht 5\' 3"  (1.6 m)   Wt 75.8 kg   SpO2 97%   BMI 29.58 kg/m   Physical Exam  Constitutional: She is oriented to person, place, and time. She appears well-developed and well-nourished. No distress.  HENT:  Head: Normocephalic and atraumatic.  Eyes:  EOM are normal.  Neck: Normal range of motion.  Cardiovascular: Normal rate, regular rhythm and normal heart sounds.  Pulmonary/Chest: Effort normal and breath sounds normal.  Abdominal: Soft. She exhibits no distension. There is no tenderness.  Musculoskeletal: Normal range of motion.  Neurological: She is alert and oriented to person, place, and time.  Skin: Skin is warm and dry.  Psychiatric: She has a normal mood and affect. Judgment normal.  Nursing note and vitals reviewed.    ED Treatments / Results  Labs (all labs ordered are listed, but only abnormal results are displayed) Labs Reviewed  CBC - Abnormal; Notable for the following components:      Result Value   HCT 46.5 (*)    All other components within normal limits  COMPREHENSIVE METABOLIC PANEL - Abnormal; Notable for the following components:   Glucose, Bld 102 (*)    All other components within normal limits  TROPONIN I    EKG EKG Interpretation  Date/Time:  Saturday October 07 2017 04:05:03 EDT Ventricular Rate:  66 PR Interval:    QRS Duration: 99 QT Interval:  442 QTC Calculation: 464 R Axis:   -39 Text Interpretation:  Sinus rhythm Left axis deviation Low voltage, precordial leads Abnormal R-wave progression, early transition No significant  change was found Confirmed by Jola Schmidt [phone]) on 10/07/2017 5:56:10 AM   Radiology No results found.  Procedures Procedures (including critical care time)  Medications Ordered in ED Medications  sodium chloride 0.9 % bolus 500 mL (0 mLs Intravenous Stopped 10/07/17 0545)     Initial Impression / Assessment and Plan / ED Course  I have reviewed the triage vital signs and the nursing notes.  Pertinent labs & imaging results that were available during my care of the patient were reviewed by me and considered in my medical decision making (see chart for details).     Fluids given.  Headache resolved.  No use of anticoagulants.  Patient with multiple PVCs on monitor.  With IV fluids these seem to have lessened.  Discharged home in good condition.  Primary care follow-up.  She understands return the emergency department for new or worsening symptoms  Final Clinical Impressions(s) / ED Diagnoses   Final diagnoses:  Acute nonintractable headache, unspecified headache type  Palpitations    ED Discharge Orders    None       Jola Schmidt, MD 10/07/17 [phone]

## 2017-10-07 NOTE — ED Notes (Signed)
Pt reports mild dizziness upon standing and transferring to wheelchair in order to go to bathroom.

## 2017-10-31 ENCOUNTER — Ambulatory Visit (AMBULATORY_SURGERY_CENTER): Payer: Self-pay

## 2017-10-31 ENCOUNTER — Encounter: Payer: Self-pay | Admitting: Gastroenterology

## 2017-10-31 VITALS — Ht 63.0 in | Wt 168.4 lb

## 2017-10-31 DIAGNOSIS — Z86010 Personal history of colonic polyps: Secondary | ICD-10-CM

## 2017-10-31 DIAGNOSIS — B880 Other acariasis: Secondary | ICD-10-CM

## 2017-10-31 HISTORY — DX: Other acariasis: B88.0

## 2017-10-31 MED ORDER — NA SULFATE-K SULFATE-MG SULF 17.5-3.13-1.6 GM/177ML PO SOLN: 1.0000 | Freq: Once | ORAL | 0 refills | Status: AC

## 2017-10-31 NOTE — Progress Notes (Signed)
Per pt, no allergies to soy or egg products.Pt not taking any weight loss meds or using  O2 at home.  Pt refused emmi video. 

## 2017-11-10 ENCOUNTER — Telehealth: Payer: Self-pay | Admitting: Gastroenterology

## 2017-11-10 ENCOUNTER — Telehealth: Payer: Self-pay | Admitting: Cardiovascular Disease

## 2017-11-10 NOTE — Telephone Encounter (Signed)
Spoke with patient and she had questions about the side effects with the bowel prep and causing heart problems. Reviewed prep instructions with pt and answered questions about her medications. Informed pt to follow the prep instructions and drink a lot liquids, but call us if she has any other concerns. Pt understood. Gwyndolyn Saxon

## 2017-11-10 NOTE — Telephone Encounter (Signed)
New Message   Pt c/o medication issue:  1. Name of Medication: suprep  2. How are you currently taking this medication (dosage and times per day)?   3. Are you having a reaction (difficulty breathing--STAT)?   4. What is your medication issue? Patient is calling because she is having a colonoscopy next Tuesday. She read on the instructions for the prep that it can cause irregular heartbeat. She is wanting to know will she be okay.

## 2017-11-10 NOTE — Telephone Encounter (Signed)
Pt called to report that she was concerned about the prep for her colonoscopy next week.Manus Gunning. It had listed a side effect of irregular heart beat. I spoke with the Pharmacist and she noted that there was a very small chance of irregular heart beat and it was mainly form the dehydration that can occur with such a prep. Pt verbalized understanding and I urged her to call the Endoscopy dept and talk with them about her concerns and she agreed and will call them today.

## 2017-11-14 ENCOUNTER — Ambulatory Visit (AMBULATORY_SURGERY_CENTER): Payer: Medicare Other | Admitting: Gastroenterology

## 2017-11-14 ENCOUNTER — Encounter: Payer: Self-pay | Admitting: Gastroenterology

## 2017-11-14 VITALS — BP 102/66 | HR 65 | Temp 98.0°F | Resp 18 | Ht 63.0 in | Wt 168.0 lb

## 2017-11-14 DIAGNOSIS — D120 Benign neoplasm of cecum: Secondary | ICD-10-CM

## 2017-11-14 DIAGNOSIS — K5730 Diverticulosis of large intestine without perforation or abscess without bleeding: Secondary | ICD-10-CM | POA: Diagnosis not present

## 2017-11-14 DIAGNOSIS — Z86010 Personal history of colonic polyps: Secondary | ICD-10-CM | POA: Diagnosis present

## 2017-11-14 MED ORDER — SODIUM CHLORIDE 0.9 % IV SOLN: 500.0000 mL | Freq: Once | INTRAVENOUS | Status: DC

## 2017-11-14 NOTE — Progress Notes (Signed)
Called to room to assist during endoscopic procedure.  Patient ID and intended procedure confirmed with present staff. Received instructions for my participation in the procedure from the performing physician.  

## 2017-11-14 NOTE — Op Note (Signed)
[place] Patient Name: [name] Procedure Date: 11/14/2017 9:48 AM MRN: [id number] Endoscopist: Thornton Park MD, MD Age: 77 Referring MD:  Date of Birth: 1940-03-19 Gender: Female Account #: [id number] Procedure:                Colonoscopy Indications:              Surveillance: Personal history of adenomatous                            polyps on last colonoscopy 5 years ago.                            Precancerous polyps removed on colonoscopies in                            1999, 2003, and 2009. No polyps on colonoscopy                            2014. No baseline GI symptoms. Medicines:                See the Anesthesia note for documentation of the                            administered medications Procedure:                Pre-Anesthesia Assessment:                           - Prior to the procedure, a History and Physical                            was performed, and patient medications and                            allergies were reviewed. The patient's tolerance of                            previous anesthesia was also reviewed. The risks                            and benefits of the procedure and the sedation                            options and risks were discussed with the patient.                            All questions were answered, and informed consent                            was obtained. Prior Anticoagulants: The patient has                            taken no previous anticoagulant or antiplatelet  agents. ASA Grade Assessment: III - A patient with                            severe systemic disease. After reviewing the risks                            and benefits, the patient was deemed in                            satisfactory condition to undergo the procedure.                           After obtaining informed consent, the colonoscope                            was passed under direct vision.  Throughout the                            procedure, the patient's blood pressure, pulse, and                            oxygen saturations were monitored continuously. The                            Colonoscope was introduced through the anus and                            advanced to the the terminal ileum, with                            identification of the appendiceal orifice and IC                            valve. The colonoscopy was technically difficult                            and complex due to restricted mobility of the colon                            and significant looping. Successful completion of                            the procedure was aided by withdrawing the scope                            and replacing with the pediatric colonoscope. The                            patient tolerated the procedure well. The quality                            of the bowel preparation was good. Scope In: 10:09:17 AM Scope Out: 10:22:00 AM Scope Withdrawal Time: 0 hours 10 minutes 26  seconds  Total Procedure Duration: 0 hours 12 minutes 43 seconds  Findings:                 The perianal and digital rectal examinations were                            normal.                           Multiple small and large-mouthed diverticula were                            found in the sigmoid colon, descending colon and                            ascending colon.                           A 10 mm multilobulated polyp was found in the                            cecum. The polyp was flat. Polypectomy was                            attempted, initially using a cold snare. Polyp                            resection was incomplete with this device with a                            small piece of the lateral margin remaining. This                            intervention then required a different device and                            polypectomy technique. The remainder of the polyp                             was removed with a cold biopsy forceps. Resection                            and retrieval appeared to be complete.                           The exam was otherwise without abnormality on                            direct and retroflexion views. Complications:            No immediate complications. Estimated Blood Loss:     Estimated blood loss was minimal. Impression:               - Diverticulosis in the sigmoid colon, in the  descending colon and in the ascending colon.                           - One 10 mm polyp in the cecum, removed with a cold                            biopsy forceps. Resected and retrieved.                           - The examination was otherwise normal on direct                            and retroflexion views. Recommendation:           - Discharge patient to home.                           - Resume previous diet today. High fiber diet                            recommended.                           - Continue present medications.                           - Await pathology results.                           - Repeat colonoscopy in 6 months for surveillance                            given the piecemeal nature of the resection. Thornton Park MD, MD 11/14/2017 10:38:47 AM This report has been signed electronically.

## 2017-11-14 NOTE — Progress Notes (Signed)
No problems noted in the recovery room. maw 

## 2017-11-14 NOTE — Progress Notes (Signed)
Report given to PACU, vss 

## 2017-11-14 NOTE — Patient Instructions (Signed)
YOU HAD AN ENDOSCOPIC PROCEDURE TODAY AT [facility]:   Refer to the procedure report that was given to you for any specific questions about what was found during the examination.  If the procedure report does not answer your questions, please call your gastroenterologist to clarify.  If you requested that your care partner not be given the details of your procedure findings, then the procedure report has been included in a sealed envelope for you to review at your convenience later.  YOU SHOULD EXPECT: Some feelings of bloating in the abdomen. Passage of more gas than usual.  Walking can help get rid of the air that was put into your GI tract during the procedure and reduce the bloating. If you had a lower endoscopy (such as a colonoscopy or flexible sigmoidoscopy) you may notice spotting of blood in your stool or on the toilet paper. If you underwent a bowel prep for your procedure, you may not have a normal bowel movement for a few days.  Please Note:  You might notice some irritation and congestion in your nose or some drainage.  This is from the oxygen used during your procedure.  There is no need for concern and it should clear up in a day or so.  SYMPTOMS TO REPORT IMMEDIATELY:   Following lower endoscopy (colonoscopy or flexible sigmoidoscopy):  Excessive amounts of blood in the stool  Significant tenderness or worsening of abdominal pains  Swelling of the abdomen that is new, acute  Fever of 100F or higher  For urgent or emergent issues, a gastroenterologist can be reached at any hour by calling [phone].   DIET:  We do recommend a small meal at first, but then you may proceed to your regular diet.  Drink plenty of fluids but you should avoid alcoholic beverages for 24 hours.  ACTIVITY:  You should plan to take it easy for the rest of today and you should NOT DRIVE or use heavy machinery until tomorrow (because of the sedation medicines used during the test).     FOLLOW UP: Our staff will call the number listed on your records the next business day following your procedure to check on you and address any questions or concerns that you may have regarding the information given to you following your procedure. If we do not reach you, we will leave a message.  However, if you are feeling well and you are not experiencing any problems, there is no need to return our call.  We will assume that you have returned to your regular daily activities without incident.  If any biopsies were taken you will be contacted by phone or by letter within the next 1-3 weeks.  Please call us at [phone] if you have not heard about the biopsies in 3 weeks.    SIGNATURES/CONFIDENTIALITY: You and/or your care partner have signed paperwork which will be entered into your electronic medical record.  These signatures attest to the fact that that the information above on your After Visit Summary has been reviewed and is understood.  Full responsibility of the confidentiality of this discharge information lies with you and/or your care-partner.    Handouts were given to your care partner on polyps, diverticulosis, and a high fiber diet. Repeat colonoscopy in 6 months for surveillance since polyp was removed piecemeal. You may resume your current medications today. Await biopsy results. Please call if any questions or concerns.

## 2017-11-14 NOTE — Progress Notes (Signed)
Pt's states no medical or surgical changes since previsit or office visit. 

## 2017-11-15 ENCOUNTER — Telehealth: Payer: Self-pay

## 2017-11-15 ENCOUNTER — Telehealth: Payer: Self-pay | Admitting: *Deleted

## 2017-11-15 NOTE — Telephone Encounter (Signed)
No answer. Number identifier. Message left to call if questions or concerns. We will also attempt to call again later in the day.

## 2017-11-15 NOTE — Telephone Encounter (Signed)
No answer, left message to call if having any issues or concerns, B.Aarna Mihalko RN 

## 2017-11-17 ENCOUNTER — Encounter: Payer: Self-pay | Admitting: Gastroenterology

## 2017-11-21 ENCOUNTER — Other Ambulatory Visit: Payer: Self-pay | Admitting: Cardiovascular Disease

## 2017-12-15 ENCOUNTER — Other Ambulatory Visit: Payer: Self-pay | Admitting: Cardiovascular Disease

## 2018-01-04 ENCOUNTER — Encounter: Payer: Self-pay | Admitting: Cardiovascular Disease

## 2018-01-05 ENCOUNTER — Ambulatory Visit: Payer: Medicare Other | Admitting: Cardiovascular Disease

## 2018-01-05 ENCOUNTER — Encounter: Payer: Self-pay | Admitting: Cardiovascular Disease

## 2018-01-05 VITALS — BP 122/76 | HR 89 | Ht 63.0 in | Wt 164.8 lb

## 2018-01-05 DIAGNOSIS — I6523 Occlusion and stenosis of bilateral carotid arteries: Secondary | ICD-10-CM

## 2018-01-05 DIAGNOSIS — I10 Essential (primary) hypertension: Secondary | ICD-10-CM

## 2018-01-05 DIAGNOSIS — I340 Nonrheumatic mitral (valve) insufficiency: Secondary | ICD-10-CM

## 2018-01-05 DIAGNOSIS — I493 Ventricular premature depolarization: Secondary | ICD-10-CM | POA: Diagnosis not present

## 2018-01-05 NOTE — Patient Instructions (Signed)
Medication Instructions:  Your physician recommends that you continue on your current medications as directed. Please refer to the Current Medication list given to you today.  If you need a refill on your cardiac medications before your next appointment, please call your pharmacy.   Lab work: none If you have labs (blood work) drawn today and your tests are completely normal, you will receive your results only by: Marland Kitchen MyChart Message (if you have MyChart) OR . A paper copy in the mail If you have any lab test that is abnormal or we need to change your treatment, we will call you to review the results.  Testing/Procedures: Your physician has requested that you have a carotid duplex. This test is an ultrasound of the carotid arteries in your neck. It looks at blood flow through these arteries that supply the brain with blood. Allow one hour for this exam. There are no restrictions or special instructions.    Follow-Up: At [facility], you and your health needs are our priority.  As part of our continuing mission to provide you with exceptional heart care, we have created designated Provider Care Teams.  These Care Teams include your primary Cardiologist (physician) and Advanced Practice Providers (APPs -  Physician Assistants and Nurse Practitioners) who all work together to provide you with the care you need, when you need it. You will need a follow up appointment in 12 months.  Please call our office 2 months in advance to schedule this appointment.  You may see Lauree Chandler, MD or one of the following Advanced Practice Providers on your designated Care Team:   [place], PA-C Melina Copa, PA-C . Ermalinda Barrios, PA-C  Any Other Special Instructions Will Be Listed Below (If Applicable).

## 2018-01-05 NOTE — Progress Notes (Signed)
Chief Complaint  Patient presents with  . Follow-up    PVC's   History of Present Illness: 77 yo female with history of severe anxiety, PVCs, hyperlipidemia, HTN, and multiple GI issues including diverticulosis, PUD and GERD along with IBS and fibromyalgia who is here today for cardiac follow up. She is followed in my office for PVCs. I saw her in 2011 for evaluation of palpitations. 48 hour Holter monitor in 2011 showed frequent PVCs with some trigeminy. Echo March 2016 with LVEF 60%, mild MR, mild AI. Stress myoview in August 2015 with no ischemia. 30 day event monitor August 2015 with PVCs but no SVT, atrial fib, VT. No pauses or bradycardia. Metoprolol was changed to Cardizem. She was seen by Dr. Caryl Comes 12/12/13 and no changes were made. Carotid artery dopplers March 2016 with mild bilateral disease. Echo March 2019 with LVEF=55-60% with grade 1 diastolic dysfunction. Mild AI.   She is here today for follow up. The patient denies any chest pain, dyspnea, palpitations, lower extremity edema, orthopnea, PND, dizziness, near syncope or syncope. She has many questions today about her BP and HR. BP at home mostly 194-174 systolic.    Primary Care Physician: Crist Infante, MD   Past Medical History:  Diagnosis Date  . Anxiety   . Appendicitis   . Back abscess   . Cataract   . Chigger bites 10/31/2017   per pt, she has over 50 chigger bites!  . Diverticulitis   . Fibromyalgia    after testing she is not truly dx with fibromyalgis  . GERD (gastroesophageal reflux disease)   . History of kidney stones    first stone now  . Hx of adenomatous colonic polyps   . Hx of cardiovascular stress test    ETT-Myoview (8/15): Poor exercise capacity, no ischemia, EF 61%-normal study  . Hypertension   . IBS (irritable bowel syndrome)   . Lichen planus   . Osteoporosis   . PUD (peptic ulcer disease)   . PVC (premature ventricular contraction)   . Thyroid disease   . TIA (transient ischemic attack)  summer 2015    Past Surgical History:  Procedure Laterality Date  . ABDOMINAL HYSTERECTOMY     complete  . APPENDECTOMY    . CARPAL TUNNEL RELEASE     left  . CYSTOSCOPY WITH RETROGRADE PYELOGRAM, URETEROSCOPY AND STENT PLACEMENT Left 07/06/2015   Procedure: CYSTOSCOPY WITH LEFT RETROGRADE PYELOGRAM AND INTERPRETATION, LEFT URETEROSCOPY WITH DUAL LUMEN PYELOSCOPY, IDENTIFCATION AND DILATION OF URETERAL STRICTURE, JJ STENT REMOVAL, BASKET EXTRACTION OF RENAL PELVIS STONE, EXTRACTION OF ORGANIZED RENAL PELVIC BLOOD CLOT;  Surgeon: Carolan Clines, MD;  Location: [facility];  Service: Urology;  Laterality: Left;  PLACEMENT OF LEFT DOUBLE J STEN  . CYSTOSCOPY/RETROGRADE/URETEROSCOPY/STONE EXTRACTION WITH BASKET Left 05/08/2015   Procedure: CYSTOSCOPY/RETROGRADE/URETEROSCOPY/JJ STENT PLACEMENT;  Surgeon: Carolan Clines, MD;  Location: [facility];  Service: Urology;  Laterality: Left;  . WRIST SURGERY     left    Current Outpatient Medications  Medication Sig Dispense Refill  . acetaminophen (TYLENOL) 500 MG tablet Take 500-1,000 mg by mouth every 6 (six) hours as needed (pain).     Marland Kitchen alum & mag hydroxide-simeth (MAALOX PLUS) 400-400-40 MG/5ML suspension Take 5 mLs by mouth every 6 (six) hours as needed for indigestion.    Marland Kitchen aspirin EC 81 MG tablet Take 1 tablet (81 mg total) by mouth daily. 30 tablet 3  . calcium carbonate (TUMS - DOSED IN MG ELEMENTAL CALCIUM) 500 MG chewable tablet Chew  1 tablet by mouth daily as needed for indigestion or heartburn.    . denosumab (PROLIA) 60 MG/ML SOSY injection Inject 60 mg into the skin every 6 (six) months.    . desoximetasone (TOPICORT) 0.25 % cream Apply 1 application topically 2 (two) times daily as needed (lichen planus on legs).     Marland Kitchen dicyclomine (BENTYL) 10 MG capsule Take 10 mg by mouth daily as needed for spasms.     Marland Kitchen diltiazem (CARDIZEM CD) 120 MG 24 hr capsule TAKE 1 CAPSULE BY MOUTH EVERY DAY 30 capsule 0  . ENSURE PLUS (ENSURE PLUS) LIQD Take  350 mLs by mouth daily.     . ergocalciferol (VITAMIN D2) 50000 UNITS capsule Take 50,000 Units by mouth See admin instructions. Takes one capsule on weeks 1 through 3 of each month on Saturdays and then one capsule on week 4 on Mondays and Thursdays. (for a total of 5 capsules per month).    . escitalopram (LEXAPRO) 10 MG tablet Take 5 mg by mouth daily.     Marland Kitchen esomeprazole (NEXIUM) 20 MG capsule Take 20 mg by mouth at bedtime.    . meclizine (ANTIVERT) 25 MG tablet Take 1 tablet (25 mg total) by mouth daily as needed for dizziness or nausea. 90 tablet 3  . polyethylene glycol (MIRALAX / GLYCOLAX) packet Take 17 g by mouth as needed for mild constipation.     . pravastatin (PRAVACHOL) 20 MG tablet Take 20 mg by mouth at bedtime.     No current facility-administered medications for this visit.     Allergies  Allergen Reactions  . Penicillins Swelling    By second day there was swelling of tongue and could not breathe Has patient had a PCN reaction causing immediate rash, facial/tongue/throat swelling, SOB or lightheadedness with hypotension: yes Has patient had a PCN reaction causing severe rash involving mucus membranes or skin necrosis: no Has patient had a PCN reaction that required hospitalization no Has patient had a PCN reaction occurring within the last 10 years:no If all of the above answers are "NO", then may proceed with Cephalosporin use  . Sulfonamide Derivatives Hives and Rash    Where applied.  . Iodinated Diagnostic Agents Other (See Comments)    Unknown allergy to unknown contrast during gb test many yrs ago//a.calhoun  . Metronidazole Diarrhea and Other (See Comments)    Hyper, decreases appetite  . Prednisone Other (See Comments)    Due to cataract on eye and history of PVC's    Social History   Socioeconomic History  . Marital status: Widowed    Spouse name: Not on file  . Number of children: Not on file  . Years of education: Not on file  . Highest education  level: Not on file  Occupational History  . Not on file  Social Needs  . Financial resource strain: Not on file  . Food insecurity:    Worry: Not on file    Inability: Not on file  . Transportation needs:    Medical: Not on file    Non-medical: Not on file  Tobacco Use  . Smoking status: Never Smoker  . Smokeless tobacco: Never Used  Substance and Sexual Activity  . Alcohol use: No  . Drug use: No  . Sexual activity: Not on file  Lifestyle  . Physical activity:    Days per week: Not on file    Minutes per session: Not on file  . Stress: Not on file  Relationships  .  Social connections:    Talks on phone: Not on file    Gets together: Not on file    Attends religious service: Not on file    Active member of club or organization: Not on file    Attends meetings of clubs or organizations: Not on file    Relationship status: Not on file  . Intimate partner violence:    Fear of current or ex partner: Not on file    Emotionally abused: Not on file    Physically abused: Not on file    Forced sexual activity: Not on file  Other Topics Concern  . Not on file  Social History Narrative  . Not on file    Family History  Problem Relation Age of Onset  . Glaucoma Mother   . Macular degeneration Mother   . Stroke Mother   . Hypertension Mother   . Lung cancer Father   . Heart attack Father   . Cancer Father   . Lung cancer Brother   . Non-Hodgkin's lymphoma Brother   . Diabetes Brother   . Atrial fibrillation Brother   . Tuberculosis Paternal Grandfather   . Colon cancer Paternal Grandmother 1  . Cancer Paternal Grandmother   . Heart attack Maternal Grandfather 72  . Cancer Unknown   . Breast cancer Neg Hx     Review of Systems:  As stated in the HPI and otherwise negative.   BP 122/76   Pulse 89   Ht 5\' 3"  (1.6 m)   Wt 164 lb 12.8 oz (74.8 kg)   SpO2 97%   BMI 29.19 kg/m   Physical Examination: General: Well developed, well nourished, NAD  HEENT: OP  clear, mucus membranes moist  SKIN: warm, dry. No rashes. Neuro: No focal deficits  Musculoskeletal: Muscle strength 5/5 all ext  Psychiatric: Mood and affect normal  Neck: No JVD, no carotid bruits, no thyromegaly, no lymphadenopathy.  Lungs:Clear bilaterally, no wheezes, rhonci, crackles Cardiovascular: Regular rate and rhythm. No murmurs, gallops or rubs. Abdomen:Soft. Bowel sounds present. Non-tender.  Extremities: No lower extremity edema. Pulses are 2 + in the bilateral DP/PT.  Echo March 2019: Left ventricle: The cavity size was normal. Systolic function was   normal. The estimated ejection fraction was in the range of 55%   to 60%. Wall motion was normal; there were no regional wall   motion abnormalities. There was an increased relative   contribution of atrial contraction to ventricular filling.   Doppler parameters are consistent with abnormal left ventricular   relaxation (grade 1 diastolic dysfunction). - Aortic valve: There was mild regurgitation. - Mitral valve: There was trivial regurgitation. - Left atrium: The atrium was mildly dilated. - Pulmonary arteries: Systolic pressure could not be accurately   estimated.  EKG:  EKG is ordered today. The ekg ordered today demonstrates  EKG from August 2019 with sinus rhythm, poor R wave progression  Recent Labs: 10/07/2017: ALT 21; BUN 11; Creatinine, Ser 0.85; Hemoglobin 14.6; Platelets 220; Potassium 4.0; Sodium 141   Lipid Panel No results found for: CHOL, TRIG, HDL, CHOLHDL, VLDL, LDLCALC, LDLDIRECT   Wt Readings from Last 3 Encounters:  01/05/18 164 lb 12.8 oz (74.8 kg)  11/14/17 168 lb (76.2 kg)  10/31/17 168 lb 6.4 oz (76.4 kg)     Other studies Reviewed: Additional studies/ records that were reviewed today include: . Review of the above records demonstrates:    Assessment and Plan:   1. PVCs: Rare palpitations. Continue Cardizem.  2. HTN: BP is controlled. No changes  3. Mitral regurgitation: Mild  by echo in 2019.  4. Aortic valve insufficiency: Mild by echo in 2019.   5. Carotid artery disease: Mild bilateral disease by dopplers March 2016. Repeat now.   Current medicines are reviewed at length with the patient today.  The patient does not have concerns regarding medicines.  The following changes have been made:  no change  Labs/ tests ordered today include:   No orders of the defined types were placed in this encounter.   Disposition:   FU with me in 12  months  Signed, Lauree Chandler, MD 01/05/2018 1:24 PM    [facility] [address], [place], [place]  [address] Phone: [phone]; Fax: [phone]

## 2018-01-08 ENCOUNTER — Ambulatory Visit (HOSPITAL_COMMUNITY)
Admission: RE | Admit: 2018-01-08 | Discharge: 2018-01-08 | Disposition: A | Discharge status: Home / Self Care | Payer: Medicare Other | Source: Ambulatory Visit | Attending: Cardiology | Admitting: Cardiology

## 2018-01-08 DIAGNOSIS — I6523 Occlusion and stenosis of bilateral carotid arteries: Secondary | ICD-10-CM | POA: Insufficient documentation

## 2018-01-10 ENCOUNTER — Telehealth: Payer: Self-pay | Admitting: Cardiovascular Disease

## 2018-01-10 NOTE — Telephone Encounter (Signed)
Patient is returning your call for results °

## 2018-01-10 NOTE — Telephone Encounter (Signed)
I spoke with pt and reviewed carotid doppler results with her.

## 2018-06-15 ENCOUNTER — Encounter: Payer: Self-pay | Admitting: Gastroenterology

## 2018-07-18 NOTE — Telephone Encounter (Signed)
I called patient to see if she would like to schedule recall colon. Please see patient note to Dr. Tarri Glenn and advise on scheduling. Patient is requesting to wait to have this done because of covid-19.

## 2018-07-30 ENCOUNTER — Other Ambulatory Visit: Payer: Self-pay | Admitting: Internal Medicine

## 2018-07-30 DIAGNOSIS — Z1231 Encounter for screening mammogram for malignant neoplasm of breast: Secondary | ICD-10-CM

## 2018-08-03 ENCOUNTER — Emergency Department (HOSPITAL_COMMUNITY)
Admission: EM | Admit: 2018-08-03 | Discharge: 2018-08-03 | Disposition: A | Discharge status: Home / Self Care | Payer: Medicare Other | Attending: Emergency Medicine | Admitting: Emergency Medicine

## 2018-08-03 ENCOUNTER — Emergency Department (HOSPITAL_COMMUNITY): Payer: Medicare Other

## 2018-08-03 ENCOUNTER — Other Ambulatory Visit: Payer: Self-pay

## 2018-08-03 ENCOUNTER — Encounter (HOSPITAL_COMMUNITY): Payer: Self-pay | Admitting: Emergency Medicine

## 2018-08-03 DIAGNOSIS — R202 Paresthesia of skin: Secondary | ICD-10-CM | POA: Diagnosis not present

## 2018-08-03 DIAGNOSIS — I493 Ventricular premature depolarization: Secondary | ICD-10-CM

## 2018-08-03 DIAGNOSIS — Z79899 Other long term (current) drug therapy: Secondary | ICD-10-CM | POA: Diagnosis not present

## 2018-08-03 DIAGNOSIS — I10 Essential (primary) hypertension: Secondary | ICD-10-CM | POA: Diagnosis not present

## 2018-08-03 DIAGNOSIS — R0789 Other chest pain: Secondary | ICD-10-CM | POA: Diagnosis present

## 2018-08-03 DIAGNOSIS — E860 Dehydration: Secondary | ICD-10-CM

## 2018-08-03 DIAGNOSIS — R079 Chest pain, unspecified: Secondary | ICD-10-CM

## 2018-08-03 LAB — BASIC METABOLIC PANEL
Anion gap: 11 (ref 5–15)
BUN: 14 mg/dL (ref 8–23)
CO2: 22 mmol/L (ref 22–32)
Calcium: 8.9 mg/dL (ref 8.9–10.3)
Chloride: 107 mmol/L (ref 98–111)
Creatinine, Ser: 0.96 mg/dL (ref 0.44–1.00)
GFR calc Af Amer: >60 mL/min (ref >60)
GFR calc non Af Amer: 57 mL/min — ABNORMAL LOW (ref >60)
Glucose, Bld: 103 mg/dL — ABNORMAL HIGH (ref 70–99)
Potassium: 3.6 mmol/L (ref 3.5–5.1)
Sodium: 140 mmol/L (ref 135–145)

## 2018-08-03 LAB — CBC
HCT: 47.8 % — ABNORMAL HIGH (ref 36.0–46.0)
Hemoglobin: 15.7 g/dL — ABNORMAL HIGH (ref 12.0–15.0)
MCH: 30.3 pg (ref 26.0–34.0)
MCHC: 32.8 g/dL (ref 30.0–36.0)
MCV: 92.1 fL (ref 80.0–100.0)
Platelets: 283 10*3/uL (ref 150–400)
RBC: 5.19 MIL/uL — ABNORMAL HIGH (ref 3.87–5.11)
RDW: 13.1 % (ref 11.5–15.5)
WBC: 7.4 10*3/uL (ref 4.0–10.5)
nRBC: 0 % (ref 0.0–0.2)

## 2018-08-03 LAB — TROPONIN I: Troponin I: <0.03 ng/mL (ref <0.03)

## 2018-08-03 NOTE — ED Provider Notes (Addendum)
[facility] EMERGENCY DEPARTMENT Provider Note   CSN: [id number] Arrival date & time: 08/03/18  1011    History   Chief Complaint Chief Complaint  Patient presents with   Tachycardia   Chest Pain   Weakness    HPI [name] is a 78 y.o. female.     HPI   Patient presents for evaluation of chest pain present for 2 days, trying Mylanta for relief without improvement.  Also has tingling in her left arm for 1 day.  She feels like her heart rate is increased, and that it is skipping.  She denies recent illnesses including cough, fever, dysuria, diarrhea, focal weakness or paresthesia.  There are no other known modifying factors.  Past Medical History:  Diagnosis Date   Anxiety    Appendicitis    Back abscess    Cataract    Chigger bites 10/31/2017   per pt, she has over 49 chigger bites!   Diverticulitis    Fibromyalgia    after testing she is not truly dx with fibromyalgis   GERD (gastroesophageal reflux disease)    History of kidney stones    first stone now   Hx of adenomatous colonic polyps    Hx of cardiovascular stress test    ETT-Myoview (8/15): Poor exercise capacity, no ischemia, EF 61%-normal study   Hypertension    IBS (irritable bowel syndrome)    Lichen planus    Osteoporosis    PUD (peptic ulcer disease)    PVC (premature ventricular contraction)    Thyroid disease    TIA (transient ischemic attack) summer 2015    Patient Active Problem List   Diagnosis Date Noted   Non-rheumatic mitral regurgitation 10/26/2016   ATRIAL PREMATURE BEATS 06/17/2009   CARDIOMYOPATHY, SECONDARY 06/09/2009   PALPITATIONS 05/18/2009   HYPERCHOLESTEROLEMIA 07/17/2007   ANXIETY 07/17/2007   PREMATURE VENTRICULAR CONTRACTIONS 07/17/2007   GERD 07/17/2007   PUD 07/17/2007   DIVERTICULITIS OF COLON 07/17/2007   OSTEOPOROSIS 07/17/2007   SLEEP DISORDER 07/17/2007   COLONIC POLYPS, HX OF 07/17/2007    APPENDICITIS, HX OF 07/17/2007    Past Surgical History:  Procedure Laterality Date   ABDOMINAL HYSTERECTOMY     complete   APPENDECTOMY     CARPAL TUNNEL RELEASE     left   CYSTOSCOPY WITH RETROGRADE PYELOGRAM, URETEROSCOPY AND STENT PLACEMENT Left 07/06/2015   Procedure: CYSTOSCOPY WITH LEFT RETROGRADE PYELOGRAM AND INTERPRETATION, LEFT URETEROSCOPY WITH DUAL LUMEN PYELOSCOPY, IDENTIFCATION AND DILATION OF URETERAL STRICTURE, JJ STENT REMOVAL, BASKET EXTRACTION OF RENAL PELVIS STONE, EXTRACTION OF ORGANIZED RENAL PELVIC BLOOD CLOT;  Surgeon: Carolan Clines, MD;  Location: [facility];  Service: Urology;  Laterality: Left;  PLACEMENT OF LEFT DOUBLE J STEN   CYSTOSCOPY/RETROGRADE/URETEROSCOPY/STONE EXTRACTION WITH BASKET Left 05/08/2015   Procedure: CYSTOSCOPY/RETROGRADE/URETEROSCOPY/JJ STENT PLACEMENT;  Surgeon: Carolan Clines, MD;  Location: [facility];  Service: Urology;  Laterality: Left;   WRIST SURGERY     left     OB History   No obstetric history on file.      Home Medications    Prior to Admission medications   Medication Sig Start Date End Date Taking? Authorizing Provider  acetaminophen (TYLENOL) 500 MG tablet Take 500-1,000 mg by mouth every 6 (six) hours as needed (pain).    Yes [provider]  alum & mag hydroxide-simeth (MAALOX PLUS) 400-400-40 MG/5ML suspension Take 5 mLs by mouth every 6 (six) hours as needed for indigestion.   Yes [provider]  aspirin  EC 81 MG tablet Take 1 tablet (81 mg total) by mouth daily. 12/18/12  Yes Burnell Blanks, MD  calcium carbonate (TUMS - DOSED IN MG ELEMENTAL CALCIUM) 500 MG chewable tablet Chew 1 tablet by mouth daily as needed for indigestion or heartburn.   Yes [provider]  Cyanocobalamin (VITAMIN B 12 PO) Take 5,000 mg by mouth daily.   Yes [provider]  denosumab (PROLIA) 60 MG/ML SOSY injection Inject 60 mg into the skin every 6 (six) months.   Yes [provider]  desoximetasone (TOPICORT) 0.25 % cream Apply 1 application topically 2 (two) times daily as needed (lichen planus on legs).    Yes [provider]  dicyclomine (BENTYL) 10 MG capsule Take 10 mg by mouth daily as needed for spasms.  07/26/11  Yes Lafayette Dragon, MD  diltiazem (CARDIZEM CD) 120 MG 24 hr capsule TAKE 1 CAPSULE BY MOUTH EVERY DAY 11/21/17  Yes Burnell Blanks, MD  ENSURE PLUS (ENSURE PLUS) LIQD Take 350 mLs by mouth daily.    Yes [provider]  ergocalciferol (VITAMIN D2) 50000 UNITS capsule Take 50,000 Units by mouth See admin instructions. Takes one capsule on weeks 1 through 3 of each month on Saturdays and then one capsule on week 4 on Mondays and Thursdays. (for a total of 5 capsules per month).   Yes [provider]  escitalopram (LEXAPRO) 20 MG tablet Take 20 mg by mouth daily.    Yes [provider]  esomeprazole (NEXIUM) 20 MG capsule Take 20 mg by mouth at bedtime.   Yes [provider]  meclizine (ANTIVERT) 25 MG tablet Take 1 tablet (25 mg total) by mouth daily as needed for dizziness or nausea. 09/16/15  Yes Burnell Blanks, MD  polyethylene glycol (MIRALAX / GLYCOLAX) packet Take 17 g by mouth as needed for mild constipation.    Yes [provider]  pravastatin (PRAVACHOL) 20 MG tablet Take 20 mg by mouth at bedtime.   Yes [provider]    Family History Family History  Problem Relation Age of Onset   Glaucoma Mother    Macular degeneration Mother    Stroke Mother    Hypertension Mother    Lung cancer Father    Heart attack Father    Cancer Father    Lung cancer Brother    Non-Hodgkin's lymphoma Brother    Diabetes Brother    Atrial fibrillation Brother    Tuberculosis Paternal Grandfather    Colon cancer Paternal Grandmother 41   Cancer Paternal Grandmother    Heart attack Maternal Grandfather 36   Cancer Other    Breast cancer Neg Hx     Social  History Social History   Tobacco Use   Smoking status: Never Smoker   Smokeless tobacco: Never Used  Substance Use Topics   Alcohol use: No   Drug use: No     Allergies   Penicillins, Sulfonamide derivatives, Iodinated diagnostic agents, Metronidazole, and Prednisone   Review of Systems Review of Systems  All other systems reviewed and are negative.    Physical Exam Updated Vital Signs BP 126/72    Pulse 74    Temp 98.2 F (36.8 C) (Oral)    Resp 16    Ht 5\' 3"  (1.6 m)    Wt 74.4 kg    SpO2 95%    BMI 29.05 kg/m   Physical Exam Vitals signs and nursing note reviewed.  Constitutional:  General: She is not in acute distress.    Appearance: She is well-developed. She is not ill-appearing, toxic-appearing or diaphoretic.  HENT:     Head: Normocephalic and atraumatic.     Right Ear: External ear normal.     Left Ear: External ear normal.     Nose: No rhinorrhea.  Eyes:     Conjunctiva/sclera: Conjunctivae normal.     Pupils: Pupils are equal, round, and reactive to light.  Neck:     Musculoskeletal: Normal range of motion and neck supple.     Trachea: Phonation normal.  Cardiovascular:     Rate and Rhythm: Normal rate and regular rhythm.     Heart sounds: Normal heart sounds.  Pulmonary:     Effort: Pulmonary effort is normal.     Breath sounds: Normal breath sounds.  Abdominal:     Palpations: Abdomen is soft.     Tenderness: There is no abdominal tenderness.  Musculoskeletal: Normal range of motion.  Skin:    General: Skin is warm and dry.  Neurological:     Mental Status: She is alert and oriented to person, place, and time.     Cranial Nerves: No cranial nerve deficit.     Sensory: No sensory deficit.     Motor: No abnormal muscle tone.     Coordination: Coordination normal.  Psychiatric:        Mood and Affect: Mood normal.        Behavior: Behavior normal.        Thought Content: Thought content normal.        Judgment: Judgment normal.       ED Treatments / Results  Labs (all labs ordered are listed, but only abnormal results are displayed) Labs Reviewed  BASIC METABOLIC PANEL - Abnormal; Notable for the following components:      Result Value   Glucose, Bld 103 (*)    GFR calc non Af Amer 57 (*)    All other components within normal limits  CBC - Abnormal; Notable for the following components:   RBC 5.19 (*)    Hemoglobin 15.7 (*)    HCT 47.8 (*)    All other components within normal limits  TROPONIN I    EKG EKG Interpretation  Date/Time:  Friday August 03 2018 10:18:16 EDT Ventricular Rate:  105 PR Interval:  186 QRS Duration: 82 QT Interval:  354 QTC Calculation: 467 R Axis:   -38 Text Interpretation:  Sinus tachycardia with frequent Premature ventricular complexes Left axis deviation Low voltage QRS Inferior-posterior infarct , age undetermined Possible Anterolateral infarct , age undetermined Abnormal ECG Since last tracing pvc are new Confirmed by Daleen Bo [phone]) on 08/03/2018 11:24:11 AM   Radiology Dg Chest 2 View  Result Date: 08/03/2018 CLINICAL DATA:  Central chest pain for 2 days. EXAM: CHEST - 2 VIEW COMPARISON:  PA and lateral chest 09/21/2013. FINDINGS: Small focus of scar in the lingula is unchanged. The lungs are otherwise clear. Heart size is normal. No pneumothorax or pleural fluid. No acute or focal bony abnormality. IMPRESSION: Negative chest. Electronically Signed   By: Inge Rise M.D.   On: 08/03/2018 11:14    Procedures Procedures (including critical care time)  Medications Ordered in ED Medications - No data to display   Initial Impression / Assessment and Plan / ED Course  I have reviewed the triage vital signs and the nursing notes.  Pertinent labs & imaging results that were available during my care of  the patient were reviewed by me and considered in my medical decision making (see chart for details).  Clinical Course as of Aug 02 1245  Fri Aug 03, 2018   1159 Normal  Troponin I - ONCE - STAT [EW]  1200 Normal except hemoglobin elevated  CBC(!) [EW]  1200 Normal except glucose high, GFR low  Basic metabolic panel(!) [EW]    Clinical Course User Index [EW] Daleen Bo, MD       Patient Vitals for the past 24 hrs:  BP Temp Temp src Pulse Resp SpO2 Height Weight  08/03/18 1200 126/72 -- -- 74 16 95 % -- --  08/03/18 1145 116/73 -- -- 82 18 93 % -- --  08/03/18 1130 138/80 -- -- 90 16 96 % -- --  08/03/18 1126 -- -- -- -- -- -- 5\' 3"  (1.6 m) 74.4 kg  08/03/18 1031 -- -- -- -- -- -- -- 74.8 kg  08/03/18 1030 118/75 98.2 F (36.8 C) Oral (!) 104 20 97 % -- --    12:44 PM Reevaluation with update and discussion. After initial assessment and treatment, an updated evaluation reveals she is comfortable and has no further complaints. Daleen Bo   Medical Decision Making: General malaise with nonspecific complaints, reassuring evaluation here.  Doubt ACS, PE or pneumonia.  CRITICAL CARE-no Performed by: Daleen Bo  Nursing Notes Reviewed/ Care Coordinated Applicable Imaging Reviewed Interpretation of Laboratory Data incorporated into ED treatment  The patient appears reasonably screened and/or stabilized for discharge and I doubt any other medical condition or other [facility] requiring further screening, evaluation, or treatment in the ED at this time prior to discharge.  Plan: Home Medications-continue usual; Home Treatments-increase oral fluid intake; return here if the recommended treatment, does not improve the symptoms; Recommended follow up-PCP, PRN    Final Clinical Impressions(s) / ED Diagnoses   Final diagnoses:  Nonspecific chest pain  PVC (premature ventricular contraction)  Dehydration    ED Discharge Orders    None       Daleen Bo, MD 08/03/18 1247    Daleen Bo, MD 08/28/18 [phone]

## 2018-08-03 NOTE — Discharge Instructions (Addendum)
The testing today did not show any serious problems.  You may be a little dehydrated.  Try to drink 1-2 extra glasses of water each day for a week or so and see if that helps your occasional feeling of lightheadedness.  Continue taking your usual medicines, exercise regularly, and try to eat 3 good meals a day.

## 2018-08-03 NOTE — ED Triage Notes (Signed)
Pt in with c/o central dull cp x 2 days, states states she tried Mylanta, but got no relief. States L arm tingling x 1 days as well. Hx of LVH, states she has felt tachycardic (double beats) today and increased wkns x 2 days.

## 2018-08-03 NOTE — ED Notes (Signed)
Patient verbalizes understanding of discharge instructions. Opportunity for questioning and answers were provided. Pt discharged from ED. 

## 2018-08-08 DIAGNOSIS — G459 Transient cerebral ischemic attack, unspecified: Secondary | ICD-10-CM | POA: Insufficient documentation

## 2018-08-08 DIAGNOSIS — K589 Irritable bowel syndrome without diarrhea: Secondary | ICD-10-CM | POA: Insufficient documentation

## 2018-08-13 ENCOUNTER — Encounter: Payer: Self-pay | Admitting: Gastroenterology

## 2018-08-13 ENCOUNTER — Ambulatory Visit: Payer: Medicare Other | Admitting: *Deleted

## 2018-08-13 ENCOUNTER — Other Ambulatory Visit: Payer: Self-pay

## 2018-08-13 VITALS — Ht 63.0 in | Wt 165.0 lb

## 2018-08-13 DIAGNOSIS — K589 Irritable bowel syndrome without diarrhea: Secondary | ICD-10-CM

## 2018-08-13 DIAGNOSIS — G459 Transient cerebral ischemic attack, unspecified: Secondary | ICD-10-CM

## 2018-08-13 DIAGNOSIS — Z86010 Personal history of colonic polyps: Secondary | ICD-10-CM

## 2018-08-13 MED ORDER — SUPREP BOWEL PREP KIT 17.5-3.13-1.6 GM/177ML PO SOLN: 1.0000 | Freq: Once | ORAL | 0 refills | Status: AC

## 2018-08-13 NOTE — Progress Notes (Signed)
Patient denies any allergies to egg or soy products. Patient denies complications with anesthesia/sedation.  Patient denies oxygen use at home and denies diet medications.   Pt verified name, DOB, address and insurance during PV today. Pt mailed instruction packet to included paper to complete and mail back to [facility] with addressed and stamped envelope, Emmi video, copy of consent form to read and not return, and instructions mailed in packet. PV completed over the phone. Pt encouraged to call with questions or issues after reviewing packet of information

## 2018-08-23 ENCOUNTER — Telehealth: Payer: Self-pay | Admitting: Gastroenterology

## 2018-08-23 NOTE — Telephone Encounter (Signed)
Spoke with patient regarding Covid-19 screening questions °Covid-19 Screening Questions ° °Do you now or have you had a fever in the last 14 days?   no   ° °Do you have any respiratory symptoms of shortness of breath or cough now or in the last 14 days?  no   ° °Do you have any family members or close contacts with diagnosed or suspected Covid-19 in the past 14 days?  no ° °Have you been tested for Covid-19 and found to be positive?  no  ° ° °

## 2018-08-27 ENCOUNTER — Encounter: Payer: Self-pay | Admitting: Gastroenterology

## 2018-08-27 ENCOUNTER — Ambulatory Visit (AMBULATORY_SURGERY_CENTER): Payer: Medicare Other | Admitting: Gastroenterology

## 2018-08-27 ENCOUNTER — Other Ambulatory Visit: Payer: Self-pay

## 2018-08-27 VITALS — BP 110/59 | HR 68 | Temp 98.8°F | Resp 16 | Ht 63.0 in | Wt 165.0 lb

## 2018-08-27 DIAGNOSIS — Z86010 Personal history of colonic polyps: Secondary | ICD-10-CM

## 2018-08-27 DIAGNOSIS — D122 Benign neoplasm of ascending colon: Secondary | ICD-10-CM

## 2018-08-27 MED ORDER — SODIUM CHLORIDE 0.9 % IV SOLN: 500.0000 mL | Freq: Once | INTRAVENOUS | Status: DC

## 2018-08-27 NOTE — Progress Notes (Signed)
Called to room to assist during endoscopic procedure.  Patient ID and intended procedure confirmed with present staff. Received instructions for my participation in the procedure from the performing physician.  

## 2018-08-27 NOTE — Progress Notes (Signed)
Covid screening and temp done by CW. Vital signs done by Judy Branson. 

## 2018-08-27 NOTE — Progress Notes (Signed)
Report to PACU, RN, vss, BBS= Clear.  

## 2018-08-27 NOTE — Op Note (Signed)
[place] Patient Name: [name] Procedure Date: 08/27/2018 10:38 AM MRN: [id number] Endoscopist: Thornton Park MD, MD Age: 78 Referring MD:  Date of Birth: 06/23/1940 Gender: Female Account #: [id number] Procedure:                Colonoscopy Indications:              High risk colon cancer surveillance: Personal                            history of colonic polyps.                           >33mm cecal polyp removed in a piecemail fashion                            11/15/27. Surveillance recommended in 6 months to                            insure complete resection.                           History of multiple adenomas on multiple                            colonoscopies prior to that time. Medicines:                See the Anesthesia note for documentation of the                            administered medications Procedure:                Pre-Anesthesia Assessment:                           - Prior to the procedure, a History and Physical                            was performed, and patient medications and                            allergies were reviewed. The patient's tolerance of                            previous anesthesia was also reviewed. The risks                            and benefits of the procedure and the sedation                            options and risks were discussed with the patient.                            All questions were answered, and informed consent  was obtained. Prior Anticoagulants: The patient has                            taken no previous anticoagulant or antiplatelet                            agents. ASA Grade Assessment: II - A patient with                            mild systemic disease. After reviewing the risks                            and benefits, the patient was deemed in                            satisfactory condition to undergo the procedure.                           After  obtaining informed consent, the colonoscope                            was passed under direct vision. Throughout the                            procedure, the patient's blood pressure, pulse, and                            oxygen saturations were monitored continuously. The                            Colonoscope was introduced through the anus and                            advanced to the the terminal ileum, with                            identification of the appendiceal orifice and IC                            valve. A second forward evaluation of the right                            colon was performed. The colonoscopy was                            technically difficult and complex due to restricted                            mobility of the colon. Successful completion of the                            procedure was aided by applying abdominal pressure  and repositioning the patient. The patient                            tolerated the procedure well. The quality of the                            bowel preparation was good. The terminal ileum,                            ileocecal valve, appendiceal orifice, and rectum                            were photographed. Scope In: 10:49:50 AM Scope Out: 11:06:40 AM Scope Withdrawal Time: 0 hours 7 minutes 40 seconds  Total Procedure Duration: 0 hours 16 minutes 50 seconds  Findings:                 The perianal and digital rectal examinations were                            normal.                           Multiple small and large-mouthed diverticula were                            found in the sigmoid colon, descending colon and                            ascending colon.                           A 5 mm polyp was found in the proximal ascending                            colon. The polyp was flat. The polyp was removed                            with a cold snare. Resection and retrieval were                             complete. Estimated blood loss was minimal.                           The exam was otherwise without abnormality on                            direct and retroflexion views. Complications:            No immediate complications. Estimated blood loss:                            Minimal. Estimated Blood Loss:     Estimated blood loss was minimal. Impression:               - Diverticulosis in the sigmoid colon, in the  descending colon and in the ascending colon.                           - One 5 mm polyp in the proximal ascending colon,                            removed with a cold snare. Resected and retrieved.                           - The examination was otherwise normal on direct                            and retroflexion views. Recommendation:           - Patient has a contact number available for                            emergencies. The signs and symptoms of potential                            delayed complications were discussed with the                            patient. Return to normal activities tomorrow.                            Written discharge instructions were provided to the                            patient.                           - Resume regular diet today. High fiber diet                            recommended.                           - Continue present medications.                           - Await pathology results.                           - No additional surveillance endoscopy recommended                            at this time based on current colon cancer                            screening guidelines. Thornton Park MD, MD 08/27/2018 11:18:39 AM This report has been signed electronically.

## 2018-08-27 NOTE — Patient Instructions (Signed)
Discharge instructions given. Handouts on polyps and diverticulosis. Resume previous medications. YOU HAD AN ENDOSCOPIC PROCEDURE TODAY AT [facility]:   Refer to the procedure report that was given to you for any specific questions about what was found during the examination.  If the procedure report does not answer your questions, please call your gastroenterologist to clarify.  If you requested that your care partner not be given the details of your procedure findings, then the procedure report has been included in a sealed envelope for you to review at your convenience later.  YOU SHOULD EXPECT: Some feelings of bloating in the abdomen. Passage of more gas than usual.  Walking can help get rid of the air that was put into your GI tract during the procedure and reduce the bloating. If you had a lower endoscopy (such as a colonoscopy or flexible sigmoidoscopy) you may notice spotting of blood in your stool or on the toilet paper. If you underwent a bowel prep for your procedure, you may not have a normal bowel movement for a few days.  Please Note:  You might notice some irritation and congestion in your nose or some drainage.  This is from the oxygen used during your procedure.  There is no need for concern and it should clear up in a day or so.  SYMPTOMS TO REPORT IMMEDIATELY:   Following lower endoscopy (colonoscopy or flexible sigmoidoscopy):  Excessive amounts of blood in the stool  Significant tenderness or worsening of abdominal pains  Swelling of the abdomen that is new, acute  Fever of 100F or higher  For urgent or emergent issues, a gastroenterologist can be reached at any hour by calling [phone].   DIET:  We do recommend a small meal at first, but then you may proceed to your regular diet.  Drink plenty of fluids but you should avoid alcoholic beverages for 24 hours.  ACTIVITY:  You should plan to take it easy for the rest of today and you should NOT DRIVE  or use heavy machinery until tomorrow (because of the sedation medicines used during the test).    FOLLOW UP: Our staff will call the number listed on your records 48-72 hours following your procedure to check on you and address any questions or concerns that you may have regarding the information given to you following your procedure. If we do not reach you, we will leave a message.  We will attempt to reach you two times.  During this call, we will ask if you have developed any symptoms of COVID 19. If you develop any symptoms (ie: fever, flu-like symptoms, shortness of breath, cough etc.) before then, please call [phone].  If you test positive for Covid 19 in the 2 weeks post procedure, please call and report this information to [place].    If any biopsies were taken you will be contacted by phone or by letter within the next 1-3 weeks.  Please call us at [phone] if you have not heard about the biopsies in 3 weeks.    SIGNATURES/CONFIDENTIALITY: You and/or your care partner have signed paperwork which will be entered into your electronic medical record.  These signatures attest to the fact that that the information above on your After Visit Summary has been reviewed and is understood.  Full responsibility of the confidentiality of this discharge information lies with you and/or your care-partner.

## 2018-08-29 ENCOUNTER — Encounter: Payer: Self-pay | Admitting: *Deleted

## 2018-08-29 ENCOUNTER — Telehealth: Payer: Self-pay

## 2018-08-29 NOTE — Telephone Encounter (Signed)
  Follow up Call-  Call back number 08/27/2018 11/14/2017  Post procedure Call Back phone  # [phone]  Permission to leave phone message Yes Yes  Some recent data might be hidden     Patient questions:  Do you have a fever, pain , or abdominal swelling? No. Pain Score  0 *  Have you tolerated food without any problems? Yes.    Have you been able to return to your normal activities? Yes.    Do you have any questions about your discharge instructions: Diet   No. Medications  No. Follow up visit  No.  Do you have questions or concerns about your Care? No.  Actions: * If pain score is 4 or above: No action needed, pain <4. 1. Have you developed a fever since your procedure? no  2.   Have you had an respiratory symptoms (SOB or cough) since your procedure? no  3.   Have you tested positive for COVID 19 since your procedure no  4.   Have you had any family members/close contacts diagnosed with the COVID 19 since your procedure?  no   If yes to any of these questions please route to Joylene John, RN and Alphonsa Gin, Therapist, sports.

## 2018-08-29 NOTE — Telephone Encounter (Signed)
  Follow up Call-  Call back number 08/27/2018 11/14/2017  Post procedure Call Back phone  # [phone]  Permission to leave phone message Yes Yes  Some recent data might be hidden     No ID on voicemail  No message left Will try again midday

## 2018-09-25 ENCOUNTER — Other Ambulatory Visit: Payer: Self-pay

## 2018-09-25 ENCOUNTER — Ambulatory Visit
Admission: RE | Admit: 2018-09-25 | Discharge: 2018-09-25 | Disposition: A | Discharge status: Home / Self Care | Payer: Medicare Other | Source: Ambulatory Visit | Attending: Internal Medicine | Admitting: Internal Medicine

## 2018-09-25 DIAGNOSIS — Z1231 Encounter for screening mammogram for malignant neoplasm of breast: Secondary | ICD-10-CM

## 2018-11-08 ENCOUNTER — Ambulatory Visit (INDEPENDENT_AMBULATORY_CARE_PROVIDER_SITE_OTHER): Payer: Medicare Other | Admitting: Cardiovascular Disease

## 2018-11-08 ENCOUNTER — Other Ambulatory Visit: Payer: Self-pay

## 2018-11-08 VITALS — BP 120/74 | HR 76 | Ht 63.0 in | Wt 163.0 lb

## 2018-11-08 DIAGNOSIS — I493 Ventricular premature depolarization: Secondary | ICD-10-CM

## 2018-11-08 NOTE — Patient Instructions (Signed)
Medication Instructions:  No changes If you need a refill on your cardiac medications before your next appointment, please call your pharmacy.   Lab work: none If you have labs (blood work) drawn today and your tests are completely normal, you will receive your results only by: . MyChart Message (if you have MyChart) OR . A paper copy in the mail If you have any lab test that is abnormal or we need to change your treatment, we will call you to review the results.  Testing/Procedures: none  Follow-Up: At [facility], you and your health needs are our priority.  As part of our continuing mission to provide you with exceptional heart care, we have created designated Provider Care Teams.  These Care Teams include your primary Cardiologist (physician) and Advanced Practice Providers (APPs -  Physician Assistants and Nurse Practitioners) who all work together to provide you with the care you need, when you need it. You will need a follow up appointment in 12 months.  Please call our office 2 months in advance to schedule this appointment.  You may see Christopher McAlhany, MD or one of the following Advanced Practice Providers on your designated Care Team:   Brittainy Simmons, PA-C Dayna Dunn, PA-C . Michele Lenze, PA-C  Any Other Special Instructions Will Be Listed Below (If Applicable).    

## 2018-11-08 NOTE — Progress Notes (Signed)
Chief Complaint  Patient presents with  . Follow-up    aortic valve disease   History of Present Illness: 78 yo female with history of aortic valve insufficiency, mitral valve regurgitation, severe anxiety, PVCs, hyperlipidemia, HTN, and multiple GI issues including diverticulosis, PUD and GERD along with IBS and fibromyalgia who is here today for cardiac follow up. She is followed in our office for PVCs. I saw her in 2011 for evaluation of palpitations. 48 hour Holter monitor in 2011 showed frequent PVCs with some trigeminy. Echo March 2016 with LVEF 60%, mild MR, mild AI. Stress myoview in August 2015 with no ischemia. 30 day event monitor August 2015 with PVCs but no SVT, atrial fib, VT. No pauses or bradycardia. Metoprolol was changed to Cardizem. She was seen by Dr. Caryl Comes 12/12/13 and no changes were made. Carotid artery dopplers November 2019 with mild bilateral disease. Echo March 2019 with LVEF=55-60% with grade 1 diastolic dysfunction. Mild AI.   She is here today for follow up. The patient denies any chest pain, dyspnea, lower extremity edema, orthopnea, PND, dizziness, near syncope or syncope. She is very anxious. Rare palpitations.     Primary Care Physician: Crist Infante, MD  Past Medical History:  Diagnosis Date  . Allergy   . Anxiety   . Appendicitis   . Back abscess   . Cataract    just watching - bilateral  . Chigger bites 10/31/2017   per pt, she has over 50 chigger bites!  . Diverticulitis   . GERD (gastroesophageal reflux disease)   . History of kidney stones    first stone now  . Hx of adenomatous colonic polyps   . Hx of cardiovascular stress test    ETT-Myoview (8/15): Poor exercise capacity, no ischemia, EF 61%-normal study  . Hyperlipidemia   . Hypertension   . IBS (irritable bowel syndrome)   . Lichen planus   . Osteoporosis   . PUD (peptic ulcer disease)   . PVC (premature ventricular contraction)   . Stroke ([place])   . Thyroid disease   . TIA  (transient ischemic attack) summer 2015    Past Surgical History:  Procedure Laterality Date  . ABDOMINAL HYSTERECTOMY     complete  . APPENDECTOMY    . CARPAL TUNNEL RELEASE     left  . COLONOSCOPY     polyps  . CYSTOSCOPY WITH RETROGRADE PYELOGRAM, URETEROSCOPY AND STENT PLACEMENT Left 07/06/2015   Procedure: CYSTOSCOPY WITH LEFT RETROGRADE PYELOGRAM AND INTERPRETATION, LEFT URETEROSCOPY WITH DUAL LUMEN PYELOSCOPY, IDENTIFCATION AND DILATION OF URETERAL STRICTURE, JJ STENT REMOVAL, BASKET EXTRACTION OF RENAL PELVIS STONE, EXTRACTION OF ORGANIZED RENAL PELVIC BLOOD CLOT;  Surgeon: Carolan Clines, MD;  Location: [facility];  Service: Urology;  Laterality: Left;  PLACEMENT OF LEFT DOUBLE J STEN  . CYSTOSCOPY/RETROGRADE/URETEROSCOPY/STONE EXTRACTION WITH BASKET Left 05/08/2015   Procedure: CYSTOSCOPY/RETROGRADE/URETEROSCOPY/JJ STENT PLACEMENT;  Surgeon: Carolan Clines, MD;  Location: [facility];  Service: Urology;  Laterality: Left;  . WRIST SURGERY     left    Current Outpatient Medications  Medication Sig Dispense Refill  . acetaminophen (TYLENOL) 500 MG tablet Take 500-1,000 mg by mouth every 6 (six) hours as needed (pain).     Marland Kitchen alum & mag hydroxide-simeth (MAALOX PLUS) 400-400-40 MG/5ML suspension Take 5 mLs by mouth every 6 (six) hours as needed for indigestion.    Marland Kitchen aspirin EC 81 MG tablet Take 1 tablet (81 mg total) by mouth daily. 30 tablet 3  . calcium carbonate (TUMS - DOSED  IN MG ELEMENTAL CALCIUM) 500 MG chewable tablet Chew 1 tablet by mouth daily as needed for indigestion or heartburn.    . Cyanocobalamin (VITAMIN B 12 PO) Take 5,000 mg by mouth daily.    Marland Kitchen denosumab (PROLIA) 60 MG/ML SOSY injection Inject 60 mg into the skin every 6 (six) months.    . desoximetasone (TOPICORT) 0.25 % cream Apply 1 application topically 2 (two) times daily as needed (lichen planus on legs).     Marland Kitchen dicyclomine (BENTYL) 10 MG capsule Take 10 mg by mouth daily as needed for spasms.     Marland Kitchen  diltiazem (CARDIZEM CD) 120 MG 24 hr capsule TAKE 1 CAPSULE BY MOUTH EVERY DAY 30 capsule 0  . ENSURE PLUS (ENSURE PLUS) LIQD Take 350 mLs by mouth daily.     . ergocalciferol (VITAMIN D2) 50000 UNITS capsule Take 50,000 Units by mouth See admin instructions. Takes one capsule on weeks 1 through 3 of each month on Saturdays and then one capsule on week 4 on Mondays and Thursdays. (for a total of 5 capsules per month).    . escitalopram (LEXAPRO) 20 MG tablet Take 20 mg by mouth daily.     Marland Kitchen esomeprazole (NEXIUM) 20 MG capsule Take 20 mg by mouth at bedtime.    . meclizine (ANTIVERT) 25 MG tablet Take 1 tablet (25 mg total) by mouth daily as needed for dizziness or nausea. 90 tablet 3  . polyethylene glycol (MIRALAX / GLYCOLAX) packet Take 17 g by mouth as needed for mild constipation.     . pravastatin (PRAVACHOL) 20 MG tablet Take 20 mg by mouth at bedtime.     No current facility-administered medications for this visit.     Allergies  Allergen Reactions  . Penicillins Swelling    By second day there was swelling of tongue and could not breathe Has patient had a PCN reaction causing immediate rash, facial/tongue/throat swelling, SOB or lightheadedness with hypotension: yes Has patient had a PCN reaction causing severe rash involving mucus membranes or skin necrosis: no Has patient had a PCN reaction that required hospitalization no Has patient had a PCN reaction occurring within the last 10 years:no If all of the above answers are "NO", then may proceed with Cephalosporin use  . Sulfonamide Derivatives Hives and Rash    Where applied.  . Iodinated Diagnostic Agents Other (See Comments)    Unknown allergy to unknown contrast during gb test many yrs ago//a.calhoun  . Metronidazole Diarrhea and Other (See Comments)    Hyper, decreases appetite  . Prednisone Other (See Comments)    Due to cataract on eye and history of PVC's    Social History   Socioeconomic History  . Marital status:  Widowed    Spouse name: Not on file  . Number of children: Not on file  . Years of education: Not on file  . Highest education level: Not on file  Occupational History  . Not on file  Social Needs  . Financial resource strain: Not on file  . Food insecurity    Worry: Not on file    Inability: Not on file  . Transportation needs    Medical: Not on file    Non-medical: Not on file  Tobacco Use  . Smoking status: Never Smoker  . Smokeless tobacco: Never Used  Substance and Sexual Activity  . Alcohol use: No  . Drug use: No  . Sexual activity: Not on file    Comment: Hysterectomy  Lifestyle  .  Physical activity    Days per week: Not on file    Minutes per session: Not on file  . Stress: Not on file  Relationships  . Social Herbalist on phone: Not on file    Gets together: Not on file    Attends religious service: Not on file    Active member of club or organization: Not on file    Attends meetings of clubs or organizations: Not on file    Relationship status: Not on file  . Intimate partner violence    Fear of current or ex partner: Not on file    Emotionally abused: Not on file    Physically abused: Not on file    Forced sexual activity: Not on file  Other Topics Concern  . Not on file  Social History Narrative  . Not on file    Family History  Problem Relation Age of Onset  . Glaucoma Mother   . Macular degeneration Mother   . Stroke Mother   . Hypertension Mother   . Lung cancer Father   . Heart attack Father   . Cancer Father   . Lung cancer Brother   . Non-Hodgkin's lymphoma Brother   . Diabetes Brother   . Atrial fibrillation Brother   . Tuberculosis Paternal Grandfather   . Colon cancer Paternal Grandmother 17  . Cancer Paternal Grandmother   . Heart attack Maternal Grandfather 72  . Cancer Other   . Breast cancer Neg Hx   . Colon polyps Neg Hx   . Esophageal cancer Neg Hx   . Rectal cancer Neg Hx   . Stomach cancer Neg Hx      Review of Systems:  As stated in the HPI and otherwise negative.   BP 120/74   Pulse 76   Ht 5\' 3"  (1.6 m)   Wt 163 lb (73.9 kg)   LMP  (LMP Unknown)   BMI 28.87 kg/m   Physical Examination: General: Well developed, well nourished, NAD  HEENT: OP clear, mucus membranes moist  SKIN: warm, dry. No rashes. Neuro: No focal deficits  Musculoskeletal: Muscle strength 5/5 all ext  Psychiatric: Mood and affect normal  Neck: No JVD, no carotid bruits, no thyromegaly, no lymphadenopathy.  Lungs:Clear bilaterally, no wheezes, rhonci, crackles Cardiovascular: Regular rate and rhythm. No murmurs, gallops or rubs. Abdomen:Soft. Bowel sounds present. Non-tender.  Extremities: No lower extremity edema. Pulses are 2 + in the bilateral DP/PT.  Echo March 2019: Left ventricle: The cavity size was normal. Systolic function was   normal. The estimated ejection fraction was in the range of 55%   to 60%. Wall motion was normal; there were no regional wall   motion abnormalities. There was an increased relative   contribution of atrial contraction to ventricular filling.   Doppler parameters are consistent with abnormal left ventricular   relaxation (grade 1 diastolic dysfunction). - Aortic valve: There was mild regurgitation. - Mitral valve: There was trivial regurgitation. - Left atrium: The atrium was mildly dilated. - Pulmonary arteries: Systolic pressure could not be accurately   estimated.  EKG:  EKG is not ordered today. The ekg ordered today demonstrates   Recent Labs: 08/03/2018: BUN 14; Creatinine, Ser 0.96; Hemoglobin 15.7; Platelets 283; Potassium 3.6; Sodium 140   Lipid Panel No results found for: CHOL, TRIG, HDL, CHOLHDL, VLDL, LDLCALC, LDLDIRECT   Wt Readings from Last 3 Encounters:  11/08/18 163 lb (73.9 kg)  08/27/18 165 lb (74.8 kg)  08/13/18 165 lb (74.8 kg)     Other studies Reviewed: Additional studies/ records that were reviewed today include: . Review of the above  records demonstrates:    Assessment and Plan:   1. PVCs: She has rare palpitations. Continue Cardizem  2. HTN: BP is controlled. No changes  3. Mitral regurgitation: Mild by echo in 2019.  4. Aortic valve insufficiency: Mild by echo in 2019. Repeat echo in 2022  5. Carotid artery disease: Mild bilateral disease by dopplers November 2019  Current medicines are reviewed at length with the patient today.  The patient does not have concerns regarding medicines.  The following changes have been made:  no change  Labs/ tests ordered today include:   No orders of the defined types were placed in this encounter.   Disposition:   FU with me in 12  months  Signed, Lauree Chandler, MD 11/08/2018 12:14 PM    [facility] [address], [place], [place]  [address] Phone: [phone]; Fax: [phone]

## 2018-11-16 ENCOUNTER — Ambulatory Visit: Payer: Medicare Other | Admitting: Cardiovascular Disease

## 2019-06-19 ENCOUNTER — Telehealth: Payer: Self-pay | Admitting: Cardiovascular Disease

## 2019-06-19 NOTE — Telephone Encounter (Signed)
Patient is concerned about taking yoga classes at the [facility]. Patient just wants to make sure she is not over doing it with her heart history. Patient also stated she has been reading a book by Dr. Lyndel Safe and she will be rewiring her brain. Patient stated she was going to learn Spanish and will need an interpreter the next time she sees Dr. Angelena Form. (patient was laughing when she stated this) Will forward to Dr. Angelena Form for advisement.

## 2019-06-19 NOTE — Telephone Encounter (Signed)
Left message on VM (DPR) okay to proceed with yoga classes.

## 2019-06-19 NOTE — Telephone Encounter (Signed)
OK to proceed with yoga classes. Gerald Stabs

## 2019-06-19 NOTE — Telephone Encounter (Signed)
Patient is requesting to speak with Dr. Camillia Herter nurse in regards to yoga classes she has been taking. She states that prior to continuing she would like to ensure that she is cleared to take classes due to her condition. Please advise.

## 2019-07-09 DIAGNOSIS — R946 Abnormal results of thyroid function studies: Secondary | ICD-10-CM | POA: Diagnosis not present

## 2019-07-09 DIAGNOSIS — F419 Anxiety disorder, unspecified: Secondary | ICD-10-CM | POA: Diagnosis not present

## 2019-07-09 DIAGNOSIS — R42 Dizziness and giddiness: Secondary | ICD-10-CM | POA: Diagnosis not present

## 2019-07-09 DIAGNOSIS — N182 Chronic kidney disease, stage 2 (mild): Secondary | ICD-10-CM | POA: Diagnosis not present

## 2019-07-09 DIAGNOSIS — J302 Other seasonal allergic rhinitis: Secondary | ICD-10-CM | POA: Diagnosis not present

## 2019-07-09 DIAGNOSIS — I493 Ventricular premature depolarization: Secondary | ICD-10-CM | POA: Diagnosis not present

## 2019-07-09 DIAGNOSIS — I129 Hypertensive chronic kidney disease with stage 1 through stage 4 chronic kidney disease, or unspecified chronic kidney disease: Secondary | ICD-10-CM | POA: Diagnosis not present

## 2019-07-12 DIAGNOSIS — R82998 Other abnormal findings in urine: Secondary | ICD-10-CM | POA: Diagnosis not present

## 2019-07-12 DIAGNOSIS — R809 Proteinuria, unspecified: Secondary | ICD-10-CM | POA: Diagnosis not present

## 2019-07-15 ENCOUNTER — Other Ambulatory Visit: Payer: Self-pay

## 2019-07-15 ENCOUNTER — Encounter: Payer: Self-pay | Admitting: Cardiovascular Disease

## 2019-07-15 ENCOUNTER — Ambulatory Visit: Payer: Medicare PPO | Admitting: Cardiovascular Disease

## 2019-07-15 VITALS — BP 110/70 | HR 82 | Ht 63.0 in | Wt 156.0 lb

## 2019-07-15 DIAGNOSIS — I351 Nonrheumatic aortic (valve) insufficiency: Secondary | ICD-10-CM | POA: Diagnosis not present

## 2019-07-15 DIAGNOSIS — I493 Ventricular premature depolarization: Secondary | ICD-10-CM | POA: Diagnosis not present

## 2019-07-15 DIAGNOSIS — I10 Essential (primary) hypertension: Secondary | ICD-10-CM | POA: Diagnosis not present

## 2019-07-15 NOTE — Progress Notes (Signed)
Chief Complaint  Patient presents with  . Follow-up    PVC's   History of Present Illness: 79 yo female with history of aortic valve insufficiency, mitral valve regurgitation, severe anxiety, PVCs, hyperlipidemia, HTN, and multiple GI issues including diverticulosis, PUD and GERD along with IBS and fibromyalgia who is here today for cardiac follow up. She is followed in our office for PVCs. I saw her in 2011 for evaluation of palpitations. 48 hour Holter monitor in 2011 showed frequent PVCs with some trigeminy. Echo March 2016 with LVEF 60%, mild MR, mild AI. Stress myoview in August 2015 with no ischemia. 30 day event monitor August 2015 with PVCs but no SVT, atrial fib, VT. No pauses or bradycardia. Metoprolol was changed to Cardizem. She was seen by Dr. Caryl Comes 12/12/13 and no changes were made. Carotid artery dopplers November 2019 with mild bilateral disease. Echo March 2019 with LVEF=55-60% with grade 1 diastolic dysfunction. Mild AI.   She is here today for follow up. The patient denies any chest pain, dyspnea, palpitations, lower extremity edema, orthopnea, PND, dizziness, near syncope or syncope. Overall feeling well. [facility]      Primary Care Physician: Crist Infante, MD  Past Medical History:  Diagnosis Date  . Allergy   . Anxiety   . Appendicitis   . Back abscess   . Cataract    just watching - bilateral  . Chigger bites 10/31/2017   per pt, she has over 50 chigger bites!  . Diverticulitis   . GERD (gastroesophageal reflux disease)   . History of kidney stones    first stone now  . Hx of adenomatous colonic polyps   . Hx of cardiovascular stress test    ETT-Myoview (8/15): Poor exercise capacity, no ischemia, EF 61%-normal study  . Hyperlipidemia   . Hypertension   . IBS (irritable bowel syndrome)   . Lichen planus   . Osteoporosis   . PUD (peptic ulcer disease)   . PVC (premature ventricular contraction)   . Stroke ([place])    . Thyroid disease   . TIA (transient ischemic attack) summer 2015    Past Surgical History:  Procedure Laterality Date  . ABDOMINAL HYSTERECTOMY     complete  . APPENDECTOMY    . CARPAL TUNNEL RELEASE     left  . COLONOSCOPY     polyps  . CYSTOSCOPY WITH RETROGRADE PYELOGRAM, URETEROSCOPY AND STENT PLACEMENT Left 07/06/2015   Procedure: CYSTOSCOPY WITH LEFT RETROGRADE PYELOGRAM AND INTERPRETATION, LEFT URETEROSCOPY WITH DUAL LUMEN PYELOSCOPY, IDENTIFCATION AND DILATION OF URETERAL STRICTURE, JJ STENT REMOVAL, BASKET EXTRACTION OF RENAL PELVIS STONE, EXTRACTION OF ORGANIZED RENAL PELVIC BLOOD CLOT;  Surgeon: Carolan Clines, MD;  Location: [facility];  Service: Urology;  Laterality: Left;  PLACEMENT OF LEFT DOUBLE J STEN  . CYSTOSCOPY/RETROGRADE/URETEROSCOPY/STONE EXTRACTION WITH BASKET Left 05/08/2015   Procedure: CYSTOSCOPY/RETROGRADE/URETEROSCOPY/JJ STENT PLACEMENT;  Surgeon: Carolan Clines, MD;  Location: [facility];  Service: Urology;  Laterality: Left;  . WRIST SURGERY     left    Current Outpatient Medications  Medication Sig Dispense Refill  . acetaminophen (TYLENOL) 500 MG tablet Take 500-1,000 mg by mouth every 6 (six) hours as needed (pain).     Marland Kitchen alum & mag hydroxide-simeth (MAALOX PLUS) 400-400-40 MG/5ML suspension Take 5 mLs by mouth every 6 (six) hours as needed for indigestion.    Marland Kitchen aspirin EC 81 MG tablet Take 1 tablet (81 mg total) by mouth daily. [place]  tablet 3  . calcium carbonate (TUMS - DOSED IN MG ELEMENTAL CALCIUM) 500 MG chewable tablet Chew 1 tablet by mouth daily as needed for indigestion or heartburn.    . Cyanocobalamin (VITAMIN B 12 PO) Take 5,000 mg by mouth daily.    Marland Kitchen denosumab (PROLIA) 60 MG/ML SOSY injection Inject 60 mg into the skin every 6 (six) months.    . desoximetasone (TOPICORT) 0.25 % cream Apply 1 application topically 2 (two) times daily as needed (lichen planus on legs).     Marland Kitchen dicyclomine (BENTYL) 10 MG capsule Take 10 mg by mouth daily as  needed for spasms.     Marland Kitchen diltiazem (CARDIZEM CD) 120 MG 24 hr capsule TAKE 1 CAPSULE BY MOUTH EVERY DAY 30 capsule 0  . ENSURE PLUS (ENSURE PLUS) LIQD Take 350 mLs by mouth daily.     . ergocalciferol (VITAMIN D2) 50000 UNITS capsule Take 50,000 Units by mouth See admin instructions. Takes one capsule on weeks 1 through 3 of each month on Saturdays and then one capsule on week 4 on Mondays and Thursdays. (for a total of 5 capsules per month).    . escitalopram (LEXAPRO) 20 MG tablet Take 20 mg by mouth daily.     Marland Kitchen esomeprazole (NEXIUM) 20 MG capsule Take 20 mg by mouth at bedtime.    . meclizine (ANTIVERT) 25 MG tablet Take 1 tablet (25 mg total) by mouth daily as needed for dizziness or nausea. 90 tablet 3  . polyethylene glycol (MIRALAX / GLYCOLAX) packet Take 17 g by mouth as needed for mild constipation.     . pravastatin (PRAVACHOL) 20 MG tablet Take 20 mg by mouth at bedtime.     No current facility-administered medications for this visit.    Allergies  Allergen Reactions  . Penicillins Swelling    By second day there was swelling of tongue and could not breathe Has patient had a PCN reaction causing immediate rash, facial/tongue/throat swelling, SOB or lightheadedness with hypotension: yes Has patient had a PCN reaction causing severe rash involving mucus membranes or skin necrosis: no Has patient had a PCN reaction that required hospitalization no Has patient had a PCN reaction occurring within the last 10 years:no If all of the above answers are "NO", then may proceed with Cephalosporin use  . Sulfonamide Derivatives Hives and Rash    Where applied.  . Iodinated Diagnostic Agents Other (See Comments)    Unknown allergy to unknown contrast during gb test many yrs ago//a.calhoun  . Metronidazole Diarrhea and Other (See Comments)    Hyper, decreases appetite  . Prednisone Other (See Comments)    Due to cataract on eye and history of PVC's    Social History   Socioeconomic  History  . Marital status: Widowed    Spouse name: Not on file  . Number of children: Not on file  . Years of education: Not on file  . Highest education level: Not on file  Occupational History  . Not on file  Tobacco Use  . Smoking status: Never Smoker  . Smokeless tobacco: Never Used  Substance and Sexual Activity  . Alcohol use: No  . Drug use: No  . Sexual activity: Not on file    Comment: Hysterectomy  Other Topics Concern  . Not on file  Social History Narrative  . Not on file   Social Determinants of Health   Financial Resource Strain:   . Difficulty of Paying Living Expenses:   Food Insecurity:   .  Worried About Charity fundraiser in the Last Year:   . Arboriculturist in the Last Year:   Transportation Needs:   . Film/video editor (Medical):   Marland Kitchen Lack of Transportation (Non-Medical):   Physical Activity:   . Days of Exercise per Week:   . Minutes of Exercise per Session:   Stress:   . Feeling of Stress :   Social Connections:   . Frequency of Communication with Friends and Family:   . Frequency of Social Gatherings with Friends and Family:   . Attends Religious Services:   . Active Member of Clubs or Organizations:   . Attends Archivist Meetings:   Marland Kitchen Marital Status:   Intimate Partner Violence:   . Fear of Current or Ex-Partner:   . Emotionally Abused:   Marland Kitchen Physically Abused:   . Sexually Abused:     Family History  Problem Relation Age of Onset  . Glaucoma Mother   . Macular degeneration Mother   . Stroke Mother   . Hypertension Mother   . Lung cancer Father   . Heart attack Father   . Cancer Father   . Lung cancer Brother   . Non-Hodgkin's lymphoma Brother   . Diabetes Brother   . Atrial fibrillation Brother   . Tuberculosis Paternal Grandfather   . Colon cancer Paternal Grandmother 10  . Cancer Paternal Grandmother   . Heart attack Maternal Grandfather 72  . Cancer Other   . Breast cancer Neg Hx   . Colon polyps Neg Hx    . Esophageal cancer Neg Hx   . Rectal cancer Neg Hx   . Stomach cancer Neg Hx     Review of Systems:  As stated in the HPI and otherwise negative.   BP 110/70   Pulse 82   Ht 5\' 3"  (1.6 m)   Wt 156 lb (70.8 kg)   LMP  (LMP Unknown)   SpO2 97%   BMI 27.63 kg/m   Physical Examination: General: Well developed, well nourished, NAD  HEENT: OP clear, mucus membranes moist  SKIN: warm, dry. No rashes. Neuro: No focal deficits  Musculoskeletal: Muscle strength 5/5 all ext  Psychiatric: Mood and affect normal  Neck: No JVD, no carotid bruits, no thyromegaly, no lymphadenopathy.  Lungs:Clear bilaterally, no wheezes, rhonci, crackles Cardiovascular: Regular rate and rhythm. No murmurs, gallops or rubs. Abdomen:Soft. Bowel sounds present. Non-tender.  Extremities: No lower extremity edema. Pulses are 2 + in the bilateral DP/PT.  Echo March 2019: Left ventricle: The cavity size was normal. Systolic function was   normal. The estimated ejection fraction was in the range of 55%   to 60%. Wall motion was normal; there were no regional wall   motion abnormalities. There was an increased relative   contribution of atrial contraction to ventricular filling.   Doppler parameters are consistent with abnormal left ventricular   relaxation (grade 1 diastolic dysfunction). - Aortic valve: There was mild regurgitation. - Mitral valve: There was trivial regurgitation. - Left atrium: The atrium was mildly dilated. - Pulmonary arteries: Systolic pressure could not be accurately   estimated.  EKG:  EKG is ordered today. The ekg ordered today demonstrates sinus with PVCs  Recent Labs: 08/03/2018: BUN 14; Creatinine, Ser 0.96; Hemoglobin 15.7; Platelets 283; Potassium 3.6; Sodium 140   Lipid Panel No results found for: CHOL, TRIG, HDL, CHOLHDL, VLDL, LDLCALC, LDLDIRECT   Wt Readings from Last 3 Encounters:  07/15/19 156 lb (70.8 kg)  11/08/18 163 lb (73.9 kg)  08/27/18 165 lb (74.8 kg)      Other studies Reviewed: Additional studies/ records that were reviewed today include: . Review of the above records demonstrates:    Assessment and Plan:   1. PVCs: Rare palpitations. Continue Cardizem.   2. HTN: BP controlled. Continue current therapy  3. Mitral regurgitation: Mild by echo in 2019.  4. Aortic valve insufficiency: Mild by echo in 2019.  Will repeat echo in March 2022.   5. Carotid artery disease: Mild bilateral disease by dopplers November 2019. Repeat in November 2022.   Current medicines are reviewed at length with the patient today.  The patient does not have concerns regarding medicines.  The following changes have been made:  no change  Labs/ tests ordered today include:   Orders Placed This Encounter  Procedures  . EKG 12-Lead  . ECHOCARDIOGRAM COMPLETE    Disposition:   FU with me in 12  months  Signed, Lauree Chandler, MD 07/15/2019 12:04 PM    [facility] [address], [place], [place]  [address] Phone: [phone]; Fax: [phone]

## 2019-07-15 NOTE — Patient Instructions (Signed)
Medication Instructions:  No changes *If you need a refill on your cardiac medications before your next appointment, please call your pharmacy*   Lab Work: none If you have labs (blood work) drawn today and your tests are completely normal, you will receive your results only by: Marland Kitchen MyChart Message (if you have MyChart) OR . A paper copy in the mail If you have any lab test that is abnormal or we need to change your treatment, we will call you to review the results.   Testing/Procedures: ECHO DUE 04/2020 Your physician has requested that you have an echocardiogram. Echocardiography is a painless test that uses sound waves to create images of your heart. It provides your doctor with information about the size and shape of your heart and how well your heart's chambers and valves are working. This procedure takes approximately one hour. There are no restrictions for this procedure.   Follow-Up: At [facility], you and your health needs are our priority.  As part of our continuing mission to provide you with exceptional heart care, we have created designated Provider Care Teams.  These Care Teams include your primary Cardiologist (physician) and Advanced Practice Providers (APPs -  Physician Assistants and Nurse Practitioners) who all work together to provide you with the care you need, when you need it.  Your next appointment:   12 month(s)  The format for your next appointment:   In Person  Provider:   You may see Lauree Chandler, MD or one of the following Advanced Practice Providers on your designated Care Team:    Melina Copa, PA-C  Ermalinda Barrios, PA-C   Other Instructions

## 2019-08-05 DIAGNOSIS — R82998 Other abnormal findings in urine: Secondary | ICD-10-CM | POA: Diagnosis not present

## 2019-08-05 DIAGNOSIS — I129 Hypertensive chronic kidney disease with stage 1 through stage 4 chronic kidney disease, or unspecified chronic kidney disease: Secondary | ICD-10-CM | POA: Diagnosis not present

## 2019-08-13 DIAGNOSIS — F419 Anxiety disorder, unspecified: Secondary | ICD-10-CM | POA: Diagnosis not present

## 2019-08-16 ENCOUNTER — Other Ambulatory Visit: Payer: Self-pay | Admitting: Internal Medicine

## 2019-08-16 DIAGNOSIS — Z1231 Encounter for screening mammogram for malignant neoplasm of breast: Secondary | ICD-10-CM

## 2019-09-26 ENCOUNTER — Ambulatory Visit
Admission: RE | Admit: 2019-09-26 | Discharge: 2019-09-26 | Disposition: A | Discharge status: Home / Self Care | Payer: Medicare PPO | Source: Ambulatory Visit | Attending: Internal Medicine | Admitting: Internal Medicine

## 2019-09-26 ENCOUNTER — Other Ambulatory Visit: Payer: Self-pay

## 2019-09-26 DIAGNOSIS — Z1231 Encounter for screening mammogram for malignant neoplasm of breast: Secondary | ICD-10-CM

## 2019-10-10 DIAGNOSIS — N182 Chronic kidney disease, stage 2 (mild): Secondary | ICD-10-CM | POA: Diagnosis not present

## 2019-10-10 DIAGNOSIS — I129 Hypertensive chronic kidney disease with stage 1 through stage 4 chronic kidney disease, or unspecified chronic kidney disease: Secondary | ICD-10-CM | POA: Diagnosis not present

## 2019-10-10 DIAGNOSIS — R55 Syncope and collapse: Secondary | ICD-10-CM | POA: Diagnosis not present

## 2019-10-10 DIAGNOSIS — I493 Ventricular premature depolarization: Secondary | ICD-10-CM | POA: Diagnosis not present

## 2019-10-10 DIAGNOSIS — I6523 Occlusion and stenosis of bilateral carotid arteries: Secondary | ICD-10-CM | POA: Diagnosis not present

## 2019-10-14 ENCOUNTER — Telehealth: Payer: Self-pay

## 2019-10-14 NOTE — Progress Notes (Signed)
Cardiology Office Note:    Date:  10/15/2019   ID:  YARIAH SELVEY, DOB 08/29/1940, MRN [id number]  PCP:  Crist Infante, MD  Cardiologist:  Lauree Chandler, MD  Electrophysiologist:  None   Referring MD: Crist Infante, MD   Chief Complaint:  Loss of Consciousness    Patient Profile:    [name] is a 79 y.o. female with:   Carotid stenosis  [place] 11/19: R 1-39%  Aortic insufficiency   Echocardiogram 3/19: mild AI, trivial MR  Mitral regurgitation  Anxiety   PVCs   Holter 2011: 1.5% PVCs  Rx with Diltiazem   Hyperlipidemia   Hypertension   Diverticulosis  GERD/PUD   IBS  Fibromyalgia   Prior CV studies:   Carotid US 01/08/18 R 03/31/37; L no stenosis >> rpt 12/2020  Echocardiogram 04/26/17 EF 55-60, no RWMA, Gr 1 DD, mild AI, trivial MR, mild LAE  Event Monitor 10/2013 PVCs   Myoview 09/26/13 EF 61, no ischemia   Holter 05/2009 1.5% PVCs   History of Present Illness:    Ms. Pitter was last seen by Dr. Angelena Form in 06/2019.    She returns for further evaluation of syncope.  Over the past several months, she has been exercising.  She tried to do yoga but this contributed to increased PVCs.  She try to do walking as well as a stationary bike.  She ultimately switched to low-level yoga.  Two weeks ago, she came home after yoga and was feeling somewhat lightheaded.  Several hours later, she bent over to pick up her dog's water bowl.  When she stood up, she became near syncopal.  She was able to lay down on the bed.  It took several minutes for her vision to come back.  She did not injure herself.  She has not had a recurrence.  She has not had exertional chest discomfort or shortness of breath.  She has not had orthopnea or lower extremity swelling.  She did see her PCP.  She had labs drawn.  Past Medical History:  Diagnosis Date  . Allergy   . Anxiety   . Appendicitis   . Back abscess   . Cataract    just watching - bilateral  . Chigger  bites 10/31/2017   per pt, she has over 50 chigger bites!  . Diverticulitis   . GERD (gastroesophageal reflux disease)   . History of kidney stones    first stone now  . Hx of adenomatous colonic polyps   . Hx of cardiovascular stress test    ETT-Myoview (8/15): Poor exercise capacity, no ischemia, EF 61%-normal study  . Hyperlipidemia   . Hypertension   . IBS (irritable bowel syndrome)   . Lichen planus   . Osteoporosis   . PUD (peptic ulcer disease)   . PVC (premature ventricular contraction)   . Stroke ([place])   . Thyroid disease   . TIA (transient ischemic attack) summer 2015    Current Medications: Current Meds  Medication Sig  . acetaminophen (TYLENOL) 500 MG tablet Take 500-1,000 mg by mouth every 6 (six) hours as needed (pain).   Marland Kitchen alum & mag hydroxide-simeth (MAALOX PLUS) 400-400-40 MG/5ML suspension Take 5 mLs by mouth every 6 (six) hours as needed for indigestion.  Marland Kitchen aspirin EC 81 MG tablet Take 1 tablet (81 mg total) by mouth daily.  Marland Kitchen bismuth subsalicylate (PEPTO BISMOL) 262 MG/15ML suspension Take 30 mLs by mouth every 6 (six) hours as needed for diarrhea or loose  stools.  . Cyanocobalamin (VITAMIN B 12 PO) Take 5,000 mg by mouth daily.  Marland Kitchen denosumab (PROLIA) 60 MG/ML SOSY injection Inject 60 mg into the skin every 6 (six) months.  . desoximetasone (TOPICORT) 0.25 % cream Apply 1 application topically 2 (two) times daily as needed (lichen planus on legs).   Marland Kitchen dicyclomine (BENTYL) 10 MG capsule Take 10 mg by mouth daily as needed for spasms.   . ENSURE PLUS (ENSURE PLUS) LIQD Take 350 mLs by mouth daily.   . ergocalciferol (VITAMIN D2) 50000 UNITS capsule Take 50,000 Units by mouth See admin instructions. Takes one capsule on weeks 1 through 3 of each month on Saturdays and then one capsule on week 4 on Mondays and Thursdays. (for a total of 5 capsules per month).  . escitalopram (LEXAPRO) 20 MG tablet Take 1 half tablet by mouth and 10 mg tablet (15 mg total) daily  .  esomeprazole (NEXIUM) 20 MG capsule Take 20 mg by mouth at bedtime.  . meclizine (ANTIVERT) 25 MG tablet Take 1 tablet (25 mg total) by mouth daily as needed for dizziness or nausea.  . Multiple Vitamins-Minerals (CENTRUM SILVER 50+WOMEN) TABS Take 1 capsule by mouth daily.   . polyethylene glycol (MIRALAX / GLYCOLAX) packet Take 17 g by mouth as needed for mild constipation.   . pravastatin (PRAVACHOL) 20 MG tablet Take 20 mg by mouth at bedtime.  . [DISCONTINUED] diltiazem (CARDIZEM CD) 120 MG 24 hr capsule TAKE 1 CAPSULE BY MOUTH EVERY DAY     Allergies:   Penicillins, Sulfonamide derivatives, Iodinated diagnostic agents, Metronidazole, and Prednisone   Social History   Tobacco Use  . Smoking status: Never Smoker  . Smokeless tobacco: Never Used  Vaping Use  . Vaping Use: Never used  Substance Use Topics  . Alcohol use: No  . Drug use: No     Family Hx: The patient's family history includes Atrial fibrillation in her brother; Cancer in her father, paternal grandmother, and another family member; Colon cancer (age of onset: 27) in her paternal grandmother; Diabetes in her brother; Glaucoma in her mother; Heart attack in her father; Heart attack (age of onset: 62) in her maternal grandfather; Hypertension in her mother; Lung cancer in her brother and father; Macular degeneration in her mother; Non-Hodgkin's lymphoma in her brother; Stroke in her mother; Tuberculosis in her paternal grandfather. There is no history of Breast cancer, Colon polyps, Esophageal cancer, Rectal cancer, or Stomach cancer.  ROS   EKGs/Labs/Other Test Reviewed:    EKG:  EKG is   ordered today.  The ekg ordered today demonstrates normal sinus rhythm, heart rate 85, left axis deviation, low voltage, PVCs, QTC 461, similar to prior tracings  Recent Labs: No results found for requested labs within last 8760 hours.   Recent Lipid Panel No results found for: CHOL, TRIG, HDL, CHOLHDL, LDLCALC,  LDLDIRECT  Physical Exam:    VS:  BP 118/60   Pulse 85   Ht 5\' 3"  (1.6 m)   Wt 158 lb (71.7 kg)   LMP  (LMP Unknown)   SpO2 97%   BMI 27.99 kg/m     Wt Readings from Last 3 Encounters:  10/15/19 158 lb (71.7 kg)  07/15/19 156 lb (70.8 kg)  11/08/18 163 lb (73.9 kg)     Constitutional:      Appearance: Healthy appearance. Not in distress.  Neck:     Thyroid: No thyromegaly.     Vascular: JVD normal.  Lymphadenopathy: No cervical adenopathy.  Pulmonary:     Effort: Pulmonary effort is normal.     Breath sounds: No wheezing. No rales.  Cardiovascular:     Normal rate. Regular rhythm. Normal S1. Normal S2.     Murmurs: There is no murmur.  Edema:    Peripheral edema absent.  Abdominal:     Palpations: Abdomen is soft. There is no hepatomegaly.  Skin:    General: Skin is warm and dry.  Neurological:     Mental Status: Alert and oriented to person, place and time.     Cranial Nerves: Cranial nerves are intact.      ASSESSMENT & PLAN:    1. Syncope, unspecified syncope type Her symptoms are most consistent with vasovagal syncope.  She has been exercising more recently.  She brought in a list of her blood pressures.  They have been running lower recently.  I suspect her increased activity has reduced her baseline blood pressure and Cardizem has made it lower.  She had labs with primary care.  I will request those results.  It has been a couple of years since her last echocardiogram.  It is not unreasonable to repeat this.  Also, it has been a couple of years since her last carotid ultrasound.  Although progressive disease would not explain her syncope, it is also not unreasonable to repeat her carotid ultrasound.  She does have a long history of PVCs.  I have also suggested that we proceed with an event monitor to rule out arrhythmia.  At this point, I have recommended that she avoid driving until her test results are obtained.  -Arrange 2D echocardiogram   -Arrange 30-day  event monitor  -Follow-up with Dr. Angelena Form in 6 to 8 weeks  2. PVC's (premature ventricular contractions) Overall, well controlled.  An event monitor will be obtained as noted above due to recent history of syncope.  I have recommended that we stop her daily diltiazem and replace this with as needed diltiazem to hopefully avoid hypotension.  -DC Cardizem CD  -Start diltiazem 30 mg every 6 hours as needed for palpitations  3. Essential hypertension As noted, blood pressure has been lower.  Adjust Cardizem CD to short acting diltiazem.  4. Nonrheumatic aortic valve insufficiency Last echocardiogram in 2019 with mild AI.  Follow-up echocardiograms.  5. Bilateral carotid artery stenosis Last carotid ultrasound in 2019 with 39% right ICA stenosis.  Obtain follow-up carotid Dopplers as outlined above.    Dispo:  Return in about 8 weeks (around 12/10/2019) for Follow up after testing w/ Dr. Angelena Form, in person.   Medication Adjustments/Labs and Tests Ordered: Current medicines are reviewed at length with the patient today.  Concerns regarding medicines are outlined above.  Tests Ordered: Orders Placed This Encounter  Procedures  . CARDIAC EVENT MONITOR  . EKG 12-Lead  . ECHOCARDIOGRAM COMPLETE  . VAS US CAROTID   Medication Changes: Meds ordered this encounter  Medications  . diltiazem (CARDIZEM) 30 MG tablet    Sig: Take 1 tablet (30 mg total) by mouth every 6 (six) hours as needed (palpitations).    Dispense:  120 tablet    Refill:  3    Signed, Richardson Dopp, PA-C  10/15/2019 10:42 AM    [address], [place], [place]  [address] Phone: [phone]; Fax: [phone]

## 2019-10-14 NOTE — Telephone Encounter (Signed)
NOTES ON FILE FROM  [facility] [phone] SENT REFERRAL TO SCHEDULING. 

## 2019-10-15 ENCOUNTER — Ambulatory Visit: Payer: Medicare PPO | Admitting: Physician Assistant

## 2019-10-15 ENCOUNTER — Other Ambulatory Visit: Payer: Self-pay

## 2019-10-15 ENCOUNTER — Encounter: Payer: Self-pay | Admitting: Physician Assistant

## 2019-10-15 VITALS — BP 118/60 | HR 85 | Ht 63.0 in | Wt 158.0 lb

## 2019-10-15 DIAGNOSIS — I6523 Occlusion and stenosis of bilateral carotid arteries: Secondary | ICD-10-CM

## 2019-10-15 DIAGNOSIS — I493 Ventricular premature depolarization: Secondary | ICD-10-CM | POA: Diagnosis not present

## 2019-10-15 DIAGNOSIS — I10 Essential (primary) hypertension: Secondary | ICD-10-CM | POA: Diagnosis not present

## 2019-10-15 DIAGNOSIS — R55 Syncope and collapse: Secondary | ICD-10-CM | POA: Diagnosis not present

## 2019-10-15 DIAGNOSIS — I351 Nonrheumatic aortic (valve) insufficiency: Secondary | ICD-10-CM

## 2019-10-15 MED ORDER — DILTIAZEM HCL 30 MG PO TABS: 30.0000 mg | ORAL_TABLET | Freq: Four times a day (QID) | ORAL | 3 refills | Status: DC | PRN

## 2019-10-15 NOTE — Patient Instructions (Signed)
Medication Instructions:  Your physician has recommended you make the following change in your medication:   1) Stop Cardizem CD 120 mg 2) Start short acting Diltiazem 30 mg, 1 tablet by mouth every 6 hours as needed for palpitations  Lab Work: None ordered today  Testing/Procedures: Your physician has requested that you have an echocardiogram. Echocardiography is a painless test that uses sound waves to create images of your heart. It provides your doctor with information about the size and shape of your heart and how well your heart's chambers and valves are working. This procedure takes approximately one hour. There are no restrictions for this procedure.  Your physician has recommended that you wear an event monitor. Event monitors are medical devices that record the heart's electrical activity. Doctors most often [place] these monitors to diagnose arrhythmias. Arrhythmias are problems with the speed or rhythm of the heartbeat. The monitor is a small, portable device. You can wear one while you do your normal daily activities. This is usually used to diagnose what is causing palpitations/syncope (passing out).  Your physician has requested that you have a carotid duplex. This test is an ultrasound of the carotid arteries in your neck. It looks at blood flow through these arteries that supply the brain with blood. Allow one hour for this exam. There are no restrictions or special instructions.  Follow-Up: At [facility], you and your health needs are our priority.  As part of our continuing mission to provide you with exceptional heart care, we have created designated Provider Care Teams.  These Care Teams include your primary Cardiologist (physician) and Advanced Practice Providers (APPs -  Physician Assistants and Nurse Practitioners) who all work together to provide you with the care you need, when you need it.  We recommend signing up for the patient portal called "MyChart".  Sign up  information is provided on this After Visit Summary.  MyChart is used to connect with patients for Virtual Visits (Telemedicine).  Patients are able to view lab/test results, encounter notes, upcoming appointments, etc.  Non-urgent messages can be sent to your provider as well.   To learn more about what you can do with MyChart, go to [email or web address].    Your next appointment:   6-8 week(s)  The format for your next appointment:   In Person  Provider:   Lauree Chandler, MD

## 2019-10-24 ENCOUNTER — Ambulatory Visit (INDEPENDENT_AMBULATORY_CARE_PROVIDER_SITE_OTHER): Payer: Medicare PPO

## 2019-10-24 DIAGNOSIS — R55 Syncope and collapse: Secondary | ICD-10-CM | POA: Diagnosis not present

## 2019-10-24 DIAGNOSIS — M810 Age-related osteoporosis without current pathological fracture: Secondary | ICD-10-CM | POA: Diagnosis not present

## 2019-10-30 ENCOUNTER — Other Ambulatory Visit: Payer: Self-pay

## 2019-10-30 ENCOUNTER — Ambulatory Visit (HOSPITAL_COMMUNITY): Payer: Medicare PPO | Attending: Cardiology

## 2019-10-30 DIAGNOSIS — R55 Syncope and collapse: Secondary | ICD-10-CM | POA: Insufficient documentation

## 2019-10-30 DIAGNOSIS — I351 Nonrheumatic aortic (valve) insufficiency: Secondary | ICD-10-CM | POA: Diagnosis not present

## 2019-10-30 LAB — ECHOCARDIOGRAM COMPLETE
Area-P 1/2: 2.56 cm2
S' Lateral: 2.45 cm

## 2019-10-31 ENCOUNTER — Encounter: Payer: Self-pay | Admitting: Physician Assistant

## 2019-11-05 ENCOUNTER — Ambulatory Visit (HOSPITAL_COMMUNITY)
Admission: RE | Admit: 2019-11-05 | Discharge: 2019-11-05 | Disposition: A | Discharge status: Home / Self Care | Payer: Medicare PPO | Source: Ambulatory Visit | Attending: Internal Medicine | Admitting: Internal Medicine

## 2019-11-05 ENCOUNTER — Other Ambulatory Visit: Payer: Self-pay

## 2019-11-05 DIAGNOSIS — I6523 Occlusion and stenosis of bilateral carotid arteries: Secondary | ICD-10-CM | POA: Diagnosis not present

## 2019-11-08 ENCOUNTER — Encounter: Payer: Self-pay | Admitting: Physician Assistant

## 2019-11-25 DIAGNOSIS — E785 Hyperlipidemia, unspecified: Secondary | ICD-10-CM | POA: Diagnosis not present

## 2019-11-25 DIAGNOSIS — R946 Abnormal results of thyroid function studies: Secondary | ICD-10-CM | POA: Diagnosis not present

## 2019-11-25 DIAGNOSIS — R7301 Impaired fasting glucose: Secondary | ICD-10-CM | POA: Diagnosis not present

## 2019-11-25 DIAGNOSIS — M859 Disorder of bone density and structure, unspecified: Secondary | ICD-10-CM | POA: Diagnosis not present

## 2019-11-27 NOTE — Progress Notes (Signed)
Chief Complaint  Patient presents with  . Follow-up    PVCs   History of Present Illness: 79 yo female with history of aortic valve insufficiency, mitral valve regurgitation, severe anxiety, PVCs, hyperlipidemia, HTN, and multiple GI issues including diverticulosis, PUD and GERD along with IBS and fibromyalgia who is here today for cardiac follow up. She is followed in our office for PVCs. I saw her in 2011 for evaluation of palpitations. 48 hour Holter monitor in 2011 showed frequent PVCs with some trigeminy. Echo March 2016 with LVEF 60%, mild MR, mild AI. Stress myoview in August 2015 with no ischemia. 30 day event monitor August 2015 with PVCs but no SVT, atrial fib, VT. No pauses or bradycardia. Metoprolol was changed to Cardizem. She was seen by Dr. Caryl Comes 12/12/13 and no changes were made. Carotid artery dopplers November 2019 with mild bilateral disease. She had a syncopal event in August 2021 after leaning over to pick up her dog's bowl. Echo September 2021 with LVEF=60-65% with grade 1 diastolic dysfunction. No significant AI. 30 day monitor September 2021 with sinus PVCs.   She is here today for follow up. The patient denies any chest pain, dyspnea, palpitations, lower extremity edema, orthopnea, PND, dizziness, near syncope or syncope. She has been feeling better over the last month.     Primary Care Physician: Crist Infante, MD  Past Medical History:  Diagnosis Date  . Allergy   . Anxiety   . Appendicitis   . Back abscess   . Carotid stenosis    R 1-39; L no stenosis >> rpt 12/2020 // carotid US 9/21: No significant ICA stenosis bilaterally  . Cataract    just watching - bilateral  . Chigger bites 10/31/2017   per pt, she has over 50 chigger bites!  . Diverticulitis   . GERD (gastroesophageal reflux disease)   . History of kidney stones    first stone now  . History of syncope    Echocardiogram 9/21: EF 60-65, no RWMA, GR 1 DD, normal RVSF, RVSP 20.5  . Hx of adenomatous  colonic polyps   . Hx of cardiovascular stress test    ETT-Myoview (8/15): Poor exercise capacity, no ischemia, EF 61%-normal study  . Hyperlipidemia   . Hypertension   . IBS (irritable bowel syndrome)   . Lichen planus   . Osteoporosis   . PUD (peptic ulcer disease)   . PVC (premature ventricular contraction)   . Stroke ([place])   . Thyroid disease   . TIA (transient ischemic attack) summer 2015    Past Surgical History:  Procedure Laterality Date  . ABDOMINAL HYSTERECTOMY     complete  . APPENDECTOMY    . CARPAL TUNNEL RELEASE     left  . COLONOSCOPY     polyps  . CYSTOSCOPY WITH RETROGRADE PYELOGRAM, URETEROSCOPY AND STENT PLACEMENT Left 07/06/2015   Procedure: CYSTOSCOPY WITH LEFT RETROGRADE PYELOGRAM AND INTERPRETATION, LEFT URETEROSCOPY WITH DUAL LUMEN PYELOSCOPY, IDENTIFCATION AND DILATION OF URETERAL STRICTURE, JJ STENT REMOVAL, BASKET EXTRACTION OF RENAL PELVIS STONE, EXTRACTION OF ORGANIZED RENAL PELVIC BLOOD CLOT;  Surgeon: Carolan Clines, MD;  Location: [facility];  Service: Urology;  Laterality: Left;  PLACEMENT OF LEFT DOUBLE J STEN  . CYSTOSCOPY/RETROGRADE/URETEROSCOPY/STONE EXTRACTION WITH BASKET Left 05/08/2015   Procedure: CYSTOSCOPY/RETROGRADE/URETEROSCOPY/JJ STENT PLACEMENT;  Surgeon: Carolan Clines, MD;  Location: [facility];  Service: Urology;  Laterality: Left;  . WRIST SURGERY     left    Current Outpatient Medications  Medication Sig Dispense Refill  .  acetaminophen (TYLENOL) 500 MG tablet Take 500-1,000 mg by mouth every 6 (six) hours as needed (pain).     Marland Kitchen alum & mag hydroxide-simeth (MAALOX PLUS) 400-400-40 MG/5ML suspension Take 5 mLs by mouth every 6 (six) hours as needed for indigestion.    Marland Kitchen aspirin EC 81 MG tablet Take 1 tablet (81 mg total) by mouth daily. 30 tablet 3  . bismuth subsalicylate (PEPTO BISMOL) 262 MG/15ML suspension Take 30 mLs by mouth every 6 (six) hours as needed for diarrhea or loose stools.    . Cyanocobalamin (VITAMIN B 12 PO)  Take 5,000 mg by mouth daily.    Marland Kitchen denosumab (PROLIA) 60 MG/ML SOSY injection Inject 60 mg into the skin every 6 (six) months.    . desoximetasone (TOPICORT) 0.25 % cream Apply 1 application topically 2 (two) times daily as needed (lichen planus on legs).     Marland Kitchen dicyclomine (BENTYL) 10 MG capsule Take 10 mg by mouth daily as needed for spasms.     Marland Kitchen diltiazem (CARDIZEM) 30 MG tablet Take 1 tablet (30 mg total) by mouth every 6 (six) hours as needed (palpitations). 120 tablet 3  . ENSURE PLUS (ENSURE PLUS) LIQD Take 350 mLs by mouth daily.     . ergocalciferol (VITAMIN D2) 50000 UNITS capsule Take 50,000 Units by mouth See admin instructions. Takes one capsule on weeks 1 through 3 of each month on Saturdays and then one capsule on week 4 on Mondays and Thursdays. (for a total of 5 capsules per month).    . escitalopram (LEXAPRO) 20 MG tablet Take 1 half tablet by mouth and 10 mg tablet (15 mg total) daily    . esomeprazole (NEXIUM) 20 MG capsule Take 20 mg by mouth at bedtime.    . meclizine (ANTIVERT) 25 MG tablet Take 1 tablet (25 mg total) by mouth daily as needed for dizziness or nausea. 90 tablet 3  . Multiple Vitamins-Minerals (CENTRUM SILVER 50+WOMEN) TABS Take 1 capsule by mouth daily.     . polyethylene glycol (MIRALAX / GLYCOLAX) packet Take 17 g by mouth as needed for mild constipation.     . pravastatin (PRAVACHOL) 20 MG tablet Take 20 mg by mouth at bedtime.     No current facility-administered medications for this visit.    Allergies  Allergen Reactions  . Penicillins Swelling    By second day there was swelling of tongue and could not breathe Has patient had a PCN reaction causing immediate rash, facial/tongue/throat swelling, SOB or lightheadedness with hypotension: yes Has patient had a PCN reaction causing severe rash involving mucus membranes or skin necrosis: no Has patient had a PCN reaction that required hospitalization no Has patient had a PCN reaction occurring within the  last 10 years:no If all of the above answers are "NO", then may proceed with Cephalosporin use  . Sulfonamide Derivatives Hives and Rash    Where applied.  . Iodinated Diagnostic Agents Other (See Comments)    Unknown allergy to unknown contrast during gb test many yrs ago//a.[facility]oun  . Metronidazole Diarrhea and Other (See Comments)    Hyper, decreases appetite  . Prednisone Other (See Comments)    Due to cataract on eye and history of PVC's    Social History   Socioeconomic History  . Marital status: Widowed    Spouse name: Not on file  . Number of children: Not on file  . Years of education: Not on file  . Highest education level: Not on file  Occupational  History  . Not on file  Tobacco Use  . Smoking status: Never Smoker  . Smokeless tobacco: Never Used  Vaping Use  . Vaping Use: Never used  Substance and Sexual Activity  . Alcohol use: No  . Drug use: No  . Sexual activity: Not on file    Comment: Hysterectomy  Other Topics Concern  . Not on file  Social History Narrative  . Not on file   Social Determinants of Health   Financial Resource Strain:   . Difficulty of Paying Living Expenses: Not on file  Food Insecurity:   . Worried About Charity fundraiser in the Last Year: Not on file  . Ran Out of Food in the Last Year: Not on file  Transportation Needs:   . Lack of Transportation (Medical): Not on file  . Lack of Transportation (Non-Medical): Not on file  Physical Activity:   . Days of Exercise per Week: Not on file  . Minutes of Exercise per Session: Not on file  Stress:   . Feeling of Stress : Not on file  Social Connections:   . Frequency of Communication with Friends and Family: Not on file  . Frequency of Social Gatherings with Friends and Family: Not on file  . Attends Religious Services: Not on file  . Active Member of Clubs or Organizations: Not on file  . Attends Archivist Meetings: Not on file  . Marital Status: Not on file   Intimate Partner Violence:   . Fear of Current or Ex-Partner: Not on file  . Emotionally Abused: Not on file  . Physically Abused: Not on file  . Sexually Abused: Not on file    Family History  Problem Relation Age of Onset  . Glaucoma Mother   . Macular degeneration Mother   . Stroke Mother   . Hypertension Mother   . Lung cancer Father   . Heart attack Father   . Cancer Father   . Lung cancer Brother   . Non-Hodgkin's lymphoma Brother   . Diabetes Brother   . Atrial fibrillation Brother   . Tuberculosis Paternal Grandfather   . Colon cancer Paternal Grandmother 13  . Cancer Paternal Grandmother   . Heart attack Maternal Grandfather 72  . Cancer Other   . Breast cancer Neg Hx   . Colon polyps Neg Hx   . Esophageal cancer Neg Hx   . Rectal cancer Neg Hx   . Stomach cancer Neg Hx     Review of Systems:  As stated in the HPI and otherwise negative.   BP 116/80   Pulse 62   Ht 5\' 3"  (1.6 m)   Wt 159 lb 6.4 oz (72.3 kg)   LMP  (LMP Unknown)   SpO2 97%   BMI 28.24 kg/m   Physical Examination:  General: Well developed, well nourished, NAD  HEENT: OP clear, mucus membranes moist  SKIN: warm, dry. No rashes. Neuro: No focal deficits  Musculoskeletal: Muscle strength 5/5 all ext  Psychiatric: Mood and affect normal  Neck: No JVD, no carotid bruits, no thyromegaly, no lymphadenopathy.  Lungs:Clear bilaterally, no wheezes, rhonci, crackles Cardiovascular: Regular rate and rhythm. No murmurs, gallops or rubs. Abdomen:Soft. Bowel sounds present. Non-tender.  Extremities: No lower extremity edema. Pulses are 2 + in the bilateral DP/PT.  Echo 10/30/19:  1. Left ventricular ejection fraction, by estimation, is 60 to 65%. The  left ventricle has normal function. The left ventricle has no regional  wall motion  abnormalities. Left ventricular diastolic parameters are  consistent with Grade I diastolic  dysfunction (impaired relaxation).  2. Right ventricular systolic  function is normal. The right ventricular  size is normal. There is normal pulmonary artery systolic pressure.  3. The mitral valve is normal in structure. No evidence of mitral valve  regurgitation. No evidence of mitral stenosis.  4. The aortic valve is normal in structure. Aortic valve regurgitation is  not visualized. No aortic stenosis is present.  5. The inferior vena cava is normal in size with greater than 50%  respiratory variability, suggesting right atrial pressure of 3 mmHg.   EKG:  EKG is not ordered today. The ekg ordered today demonstrates   Recent Labs: No results found for requested labs within last 8760 hours.   Lipid Panel No results found for: CHOL, TRIG, HDL, CHOLHDL, VLDL, LDLCALC, LDLDIRECT   Wt Readings from Last 3 Encounters:  11/28/19 159 lb 6.4 oz (72.3 kg)  10/15/19 158 lb (71.7 kg)  07/15/19 156 lb (70.8 kg)     Other studies Reviewed: Additional studies/ records that were reviewed today include: . Review of the above records demonstrates:    Assessment and Plan:   1. PVCs: She has rare palpitations. Continue diltiazem 30 mg as needed. No longer on on long acting Cardizem due to soft BP.   2. HTN: BP is well controlled. Continue current therapy  3. Mitral regurgitation: No significant MR by echo in 2021  4. Aortic valve insufficiency: No significant AI by echo in 2021.   5. Carotid artery disease: Mild bilateral disease by dopplers September 2021.   Current medicines are reviewed at length with the patient today.  The patient does not have concerns regarding medicines.  The following changes have been made:  no change  Labs/ tests ordered today include:   No orders of the defined types were placed in this encounter.   Disposition:   FU with me in 12  months  Signed, Lauree Chandler, MD 11/28/2019 2:56 PM    [address] [place], [place],   [address] Phone: [phone]; Fax: [phone]

## 2019-11-28 ENCOUNTER — Ambulatory Visit: Payer: Medicare PPO | Admitting: Cardiovascular Disease

## 2019-11-28 ENCOUNTER — Other Ambulatory Visit: Payer: Self-pay

## 2019-11-28 ENCOUNTER — Encounter: Payer: Self-pay | Admitting: Cardiovascular Disease

## 2019-11-28 VITALS — BP 116/80 | HR 62 | Ht 63.0 in | Wt 159.4 lb

## 2019-11-28 DIAGNOSIS — I10 Essential (primary) hypertension: Secondary | ICD-10-CM

## 2019-11-28 DIAGNOSIS — I493 Ventricular premature depolarization: Secondary | ICD-10-CM

## 2019-11-28 NOTE — Patient Instructions (Signed)
Medication Instructions:  Your physician recommends that you continue on your current medications as directed. Please refer to the Current Medication list given to you today.  *If you need a refill on your cardiac medications before your next appointment, please call your pharmacy*  Follow-Up: At [facility], you and your health needs are our priority.  As part of our continuing mission to provide you with exceptional heart care, we have created designated Provider Care Teams.  These Care Teams include your primary Cardiologist (physician) and Advanced Practice Providers (APPs -  Physician Assistants and Nurse Practitioners) who all work together to provide you with the care you need, when you need it.  Your next appointment:   1 year(s)  The format for your next appointment:   In Person  Provider:   You may see Christopher McAlhany, MD or one of the following Advanced Practice Providers on your designated Care Team:    Dayna Dunn, PA-C  Michele Lenze, PA-C    

## 2019-11-29 DIAGNOSIS — Z0000 Encounter for general adult medical examination without abnormal findings: Secondary | ICD-10-CM | POA: Diagnosis not present

## 2019-11-29 DIAGNOSIS — G459 Transient cerebral ischemic attack, unspecified: Secondary | ICD-10-CM | POA: Diagnosis not present

## 2019-11-29 DIAGNOSIS — N182 Chronic kidney disease, stage 2 (mild): Secondary | ICD-10-CM | POA: Diagnosis not present

## 2019-11-29 DIAGNOSIS — Z23 Encounter for immunization: Secondary | ICD-10-CM | POA: Diagnosis not present

## 2019-11-29 DIAGNOSIS — M810 Age-related osteoporosis without current pathological fracture: Secondary | ICD-10-CM | POA: Diagnosis not present

## 2019-11-29 DIAGNOSIS — I129 Hypertensive chronic kidney disease with stage 1 through stage 4 chronic kidney disease, or unspecified chronic kidney disease: Secondary | ICD-10-CM | POA: Diagnosis not present

## 2019-11-29 DIAGNOSIS — R809 Proteinuria, unspecified: Secondary | ICD-10-CM | POA: Diagnosis not present

## 2019-11-29 DIAGNOSIS — J302 Other seasonal allergic rhinitis: Secondary | ICD-10-CM | POA: Diagnosis not present

## 2019-11-29 DIAGNOSIS — F419 Anxiety disorder, unspecified: Secondary | ICD-10-CM | POA: Diagnosis not present

## 2019-11-29 DIAGNOSIS — E785 Hyperlipidemia, unspecified: Secondary | ICD-10-CM | POA: Diagnosis not present

## 2019-11-29 DIAGNOSIS — Z1331 Encounter for screening for depression: Secondary | ICD-10-CM | POA: Diagnosis not present

## 2019-12-16 DIAGNOSIS — Z1212 Encounter for screening for malignant neoplasm of rectum: Secondary | ICD-10-CM | POA: Diagnosis not present

## 2020-01-02 ENCOUNTER — Other Ambulatory Visit: Payer: Self-pay | Admitting: Internal Medicine

## 2020-01-02 DIAGNOSIS — M810 Age-related osteoporosis without current pathological fracture: Secondary | ICD-10-CM

## 2020-01-07 ENCOUNTER — Other Ambulatory Visit: Payer: Self-pay | Admitting: Physician Assistant

## 2020-04-10 DIAGNOSIS — S92515A Nondisplaced fracture of proximal phalanx of left lesser toe(s), initial encounter for closed fracture: Secondary | ICD-10-CM | POA: Diagnosis not present

## 2020-04-10 DIAGNOSIS — M79675 Pain in left toe(s): Secondary | ICD-10-CM | POA: Diagnosis not present

## 2020-04-17 DIAGNOSIS — M79672 Pain in left foot: Secondary | ICD-10-CM | POA: Diagnosis not present

## 2020-04-23 DIAGNOSIS — M810 Age-related osteoporosis without current pathological fracture: Secondary | ICD-10-CM | POA: Diagnosis not present

## 2020-04-30 ENCOUNTER — Ambulatory Visit (HOSPITAL_COMMUNITY): Payer: Medicare PPO | Attending: Cardiology

## 2020-04-30 ENCOUNTER — Other Ambulatory Visit: Payer: Self-pay

## 2020-04-30 DIAGNOSIS — I351 Nonrheumatic aortic (valve) insufficiency: Secondary | ICD-10-CM | POA: Diagnosis not present

## 2020-04-30 DIAGNOSIS — I10 Essential (primary) hypertension: Secondary | ICD-10-CM | POA: Diagnosis not present

## 2020-04-30 DIAGNOSIS — I493 Ventricular premature depolarization: Secondary | ICD-10-CM | POA: Insufficient documentation

## 2020-05-01 LAB — ECHOCARDIOGRAM COMPLETE
Area-P 1/2: 5.02 cm2
S' Lateral: 2.7 cm

## 2020-05-19 DIAGNOSIS — H52223 Regular astigmatism, bilateral: Secondary | ICD-10-CM | POA: Diagnosis not present

## 2020-05-19 DIAGNOSIS — H25013 Cortical age-related cataract, bilateral: Secondary | ICD-10-CM | POA: Diagnosis not present

## 2020-05-19 DIAGNOSIS — H5202 Hypermetropia, left eye: Secondary | ICD-10-CM | POA: Diagnosis not present

## 2020-05-19 DIAGNOSIS — H524 Presbyopia: Secondary | ICD-10-CM | POA: Diagnosis not present

## 2020-05-22 ENCOUNTER — Other Ambulatory Visit: Payer: Self-pay

## 2020-05-22 ENCOUNTER — Ambulatory Visit
Admission: RE | Admit: 2020-05-22 | Discharge: 2020-05-22 | Disposition: A | Discharge status: Home / Self Care | Payer: Medicare PPO | Source: Ambulatory Visit | Attending: Internal Medicine | Admitting: Internal Medicine

## 2020-05-22 DIAGNOSIS — M8589 Other specified disorders of bone density and structure, multiple sites: Secondary | ICD-10-CM | POA: Diagnosis not present

## 2020-05-22 DIAGNOSIS — M810 Age-related osteoporosis without current pathological fracture: Secondary | ICD-10-CM

## 2020-05-22 DIAGNOSIS — Z780 Asymptomatic menopausal state: Secondary | ICD-10-CM | POA: Diagnosis not present

## 2020-05-29 DIAGNOSIS — E785 Hyperlipidemia, unspecified: Secondary | ICD-10-CM | POA: Diagnosis not present

## 2020-05-29 DIAGNOSIS — K219 Gastro-esophageal reflux disease without esophagitis: Secondary | ICD-10-CM | POA: Diagnosis not present

## 2020-05-29 DIAGNOSIS — F419 Anxiety disorder, unspecified: Secondary | ICD-10-CM | POA: Diagnosis not present

## 2020-05-29 DIAGNOSIS — M270 Developmental disorders of jaws: Secondary | ICD-10-CM | POA: Diagnosis not present

## 2020-05-29 DIAGNOSIS — I491 Atrial premature depolarization: Secondary | ICD-10-CM | POA: Diagnosis not present

## 2020-05-29 DIAGNOSIS — M79675 Pain in left toe(s): Secondary | ICD-10-CM | POA: Diagnosis not present

## 2020-05-29 DIAGNOSIS — I129 Hypertensive chronic kidney disease with stage 1 through stage 4 chronic kidney disease, or unspecified chronic kidney disease: Secondary | ICD-10-CM | POA: Diagnosis not present

## 2020-05-29 DIAGNOSIS — M810 Age-related osteoporosis without current pathological fracture: Secondary | ICD-10-CM | POA: Diagnosis not present

## 2020-08-12 ENCOUNTER — Other Ambulatory Visit: Payer: Self-pay | Admitting: Internal Medicine

## 2020-08-12 DIAGNOSIS — Z1231 Encounter for screening mammogram for malignant neoplasm of breast: Secondary | ICD-10-CM

## 2020-09-18 DIAGNOSIS — F419 Anxiety disorder, unspecified: Secondary | ICD-10-CM | POA: Diagnosis not present

## 2020-09-18 DIAGNOSIS — R42 Dizziness and giddiness: Secondary | ICD-10-CM | POA: Diagnosis not present

## 2020-09-18 DIAGNOSIS — K589 Irritable bowel syndrome without diarrhea: Secondary | ICD-10-CM | POA: Diagnosis not present

## 2020-09-18 DIAGNOSIS — E559 Vitamin D deficiency, unspecified: Secondary | ICD-10-CM | POA: Diagnosis not present

## 2020-09-18 DIAGNOSIS — K219 Gastro-esophageal reflux disease without esophagitis: Secondary | ICD-10-CM | POA: Diagnosis not present

## 2020-09-18 DIAGNOSIS — I129 Hypertensive chronic kidney disease with stage 1 through stage 4 chronic kidney disease, or unspecified chronic kidney disease: Secondary | ICD-10-CM | POA: Diagnosis not present

## 2020-09-18 DIAGNOSIS — I493 Ventricular premature depolarization: Secondary | ICD-10-CM | POA: Diagnosis not present

## 2020-10-05 ENCOUNTER — Other Ambulatory Visit: Payer: Self-pay

## 2020-10-05 ENCOUNTER — Ambulatory Visit
Admission: RE | Admit: 2020-10-05 | Discharge: 2020-10-05 | Disposition: A | Discharge status: Home / Self Care | Payer: Medicare PPO | Source: Ambulatory Visit | Attending: Internal Medicine | Admitting: Internal Medicine

## 2020-10-05 DIAGNOSIS — Z1231 Encounter for screening mammogram for malignant neoplasm of breast: Secondary | ICD-10-CM | POA: Diagnosis not present

## 2020-10-20 DIAGNOSIS — K589 Irritable bowel syndrome without diarrhea: Secondary | ICD-10-CM | POA: Diagnosis not present

## 2020-10-20 DIAGNOSIS — K219 Gastro-esophageal reflux disease without esophagitis: Secondary | ICD-10-CM | POA: Diagnosis not present

## 2020-11-10 ENCOUNTER — Encounter: Payer: Self-pay | Admitting: Gastroenterology

## 2020-11-10 ENCOUNTER — Ambulatory Visit: Payer: Medicare PPO | Admitting: Gastroenterology

## 2020-11-10 ENCOUNTER — Other Ambulatory Visit (INDEPENDENT_AMBULATORY_CARE_PROVIDER_SITE_OTHER): Payer: Medicare PPO

## 2020-11-10 VITALS — BP 106/70 | HR 46 | Ht 63.0 in | Wt 145.0 lb

## 2020-11-10 DIAGNOSIS — R140 Abdominal distension (gaseous): Secondary | ICD-10-CM

## 2020-11-10 LAB — CBC
HCT: 45.2 % (ref 36.0–46.0)
Hemoglobin: 14.8 g/dL (ref 12.0–15.0)
MCHC: 32.8 g/dL (ref 30.0–36.0)
MCV: 92.7 fl (ref 78.0–100.0)
Platelets: 247 10*3/uL (ref 150.0–400.0)
RBC: 4.88 Mil/uL (ref 3.87–5.11)
RDW: 13.3 % (ref 11.5–15.5)
WBC: 6 10*3/uL (ref 4.0–10.5)

## 2020-11-10 LAB — COMPREHENSIVE METABOLIC PANEL
ALT: 29 U/L (ref 0–35)
AST: 19 U/L (ref 0–37)
Albumin: 4.2 g/dL (ref 3.5–5.2)
Alkaline Phosphatase: 44 U/L (ref 39–117)
BUN: 17 mg/dL (ref 6–23)
CO2: 29 mEq/L (ref 19–32)
Calcium: 9.7 mg/dL (ref 8.4–10.5)
Chloride: 102 mEq/L (ref 96–112)
Creatinine, Ser: 1.04 mg/dL (ref 0.40–1.20)
GFR: 50.5 mL/min — ABNORMAL LOW (ref >60.00)
Glucose, Bld: 91 mg/dL (ref 70–99)
Potassium: 4.1 mEq/L (ref 3.5–5.1)
Sodium: 141 mEq/L (ref 135–145)
Total Bilirubin: 0.8 mg/dL (ref 0.2–1.2)
Total Protein: 7.4 g/dL (ref 6.0–8.3)

## 2020-11-10 LAB — TSH: TSH: 2.41 u[IU]/mL (ref 0.35–5.50)

## 2020-11-10 NOTE — Patient Instructions (Addendum)
If you are age 81 or older, your body mass index should be between 23-30. Your Body mass index is 25.69 kg/m. If this is out of the aforementioned range listed, please consider follow up with your Primary Care Provider. __________________________________________________________  The [facility] providers would like to encourage you to use [facility] to communicate with providers for non-urgent requests or questions.  Due to long hold times on the telephone, sending your provider a message by [facility] may be a faster and more efficient way to get a response.  Please allow 48 business hours for a response.  Please remember that this is for non-urgent requests.   Your provider has requested that you go to the basement level for lab work before leaving today. Press "B" on the elevator. The lab is located at the first door on the left as you exit the elevator.  Due to recent changes in healthcare laws, you may see the results of your imaging and laboratory studies on MyChart before your provider has had a chance to review them.  We understand that in some cases there may be results that are confusing or concerning to you. Not all laboratory results come back in the same time frame and the provider may be waiting for multiple results in order to interpret others.  Please give [place] 48 hours in order for your provider to thoroughly review all the results before contacting the office for clarification of your results.   We recommend that you slowly reintroduce the diet that you were used to for so many years and feeling so well on.  You will follow up in our office with Dr Tarri Glenn on 12-18-20 at 230pm.  Thank you for entrusting me with your care and choosing [facility].  Dr Ardis Hughs

## 2020-11-10 NOTE — Progress Notes (Signed)
HPI: This is a very pleasant 80 year old woman who was referred to me by Crist Infante, MD  to evaluate bloating, change in bowel habits  She is a patient of Dr. Joelene Millin beavers who was inadvertently put on my schedule as a new patient.  Dr. Tarri Glenn has performed 2 colonoscopies for her, the most recent was July 2020.  This showed diverticulosis throughout the colon, a 5 mm flat polyp was removed.  This was a tubular adenoma on pathology.  Given her age she was recommended against further colonoscopy surveillance tests.  She brought with her a 3 ring binder of health information filled with about 100 pages of information.  For several years she tells me she has been eating a FODMAP diet and feeling quite well with it.  She had previously been bothered by fatigue and that was gone on her FODMAP diet.  She also thinks is helped her immune system.  She began to be concerned about getting enough vitamins and minerals.  She started drinking an Ensure every day and eating a banana every day however more recently she got quite concerned about her vitamin B intake.  She decided to purchase large amounts of several different fruits that she has read are high and vitamin B.  She ate significant amounts of these fruits for several days and then had a lot of GI discomfort including bloating, loose stools and a left lower quadrant pain that eased after 2 to 3 hours.  She had no fevers or chills.    She saw her PCP shortly after this started and was prescribed omeprazole twice daily.  She tells me this did not help and so she started taking famotidine 1 to 2 pills a day and that really has not helped either.  She bought a [employer] digestive health textbook which she really likes, it is in a lot of Laban's terms.  Because of this reading she did she started slowly reintroducing a lot of foods to her diet and has fairly.  Her bowels are getting back to normal and her bloating is definitely improved although not  completely gone.  She has had no overt bleeding  She did lose 10 pounds at the start of this when she significantly limited her diet after started feeling poorly.   Review of systems: Pertinent positive and negative review of systems were noted in the above HPI section. All other review negative.   Past Medical History:  Diagnosis Date   Allergy    Anxiety    Appendicitis    Back abscess    Carotid stenosis    R 1-39; L no stenosis >> rpt 12/2020 // carotid US 9/21: No significant ICA stenosis bilaterally   Cataract    just watching - bilateral   Chigger bites 10/31/2017   per pt, she has over 50 chigger bites!   Diverticulitis    GERD (gastroesophageal reflux disease)    History of kidney stones    first stone now   History of syncope    Echocardiogram 9/21: EF 60-65, no RWMA, GR 1 DD, normal RVSF, RVSP 20.5   Hx of adenomatous colonic polyps    Hx of cardiovascular stress test    ETT-Myoview (8/15): Poor exercise capacity, no ischemia, EF 61%-normal study   Hyperlipidemia    Hypertension    IBS (irritable bowel syndrome)    Lichen planus    Osteoporosis    PUD (peptic ulcer disease)    PVC (premature ventricular contraction)  Stroke [facility])    Thyroid disease    TIA (transient ischemic attack) summer 2015    Past Surgical History:  Procedure Laterality Date   ABDOMINAL HYSTERECTOMY     complete   APPENDECTOMY     CARPAL TUNNEL RELEASE     left   COLONOSCOPY     polyps   CYSTOSCOPY WITH RETROGRADE PYELOGRAM, URETEROSCOPY AND STENT PLACEMENT Left 07/06/2015   Procedure: CYSTOSCOPY WITH LEFT RETROGRADE PYELOGRAM AND INTERPRETATION, LEFT URETEROSCOPY WITH DUAL LUMEN PYELOSCOPY, IDENTIFCATION AND DILATION OF URETERAL STRICTURE, JJ STENT REMOVAL, BASKET EXTRACTION OF RENAL PELVIS STONE, EXTRACTION OF ORGANIZED RENAL PELVIC BLOOD CLOT;  Surgeon: Carolan Clines, MD;  Location: [facility];  Service: Urology;  Laterality: Left;  PLACEMENT OF LEFT DOUBLE J STEN    CYSTOSCOPY/RETROGRADE/URETEROSCOPY/STONE EXTRACTION WITH BASKET Left 05/08/2015   Procedure: CYSTOSCOPY/RETROGRADE/URETEROSCOPY/JJ STENT PLACEMENT;  Surgeon: Carolan Clines, MD;  Location: [facility];  Service: Urology;  Laterality: Left;   WRIST SURGERY     left    Current Outpatient Medications  Medication Sig Dispense Refill   acetaminophen (TYLENOL) 500 MG tablet Take 500-1,000 mg by mouth every 6 (six) hours as needed (pain).      alum & mag hydroxide-simeth (MAALOX PLUS) 400-400-40 MG/5ML suspension Take 5 mLs by mouth every 6 (six) hours as needed for indigestion.     aspirin EC 81 MG tablet Take 1 tablet (81 mg total) by mouth daily. 30 tablet 3   bismuth subsalicylate (PEPTO BISMOL) 262 MG/15ML suspension Take 30 mLs by mouth every 6 (six) hours as needed for diarrhea or loose stools.     Cyanocobalamin (VITAMIN B 12 PO) Take 5,000 mg by mouth daily.     denosumab (PROLIA) 60 MG/ML SOSY injection Inject 60 mg into the skin every 6 (six) months.     desoximetasone (TOPICORT) 0.25 % cream Apply 1 application topically 2 (two) times daily as needed (lichen planus on legs).      dicyclomine (BENTYL) 10 MG capsule Take 10 mg by mouth daily as needed for spasms.      diltiazem (CARDIZEM) 30 MG tablet TAKE 1 TABLET (30 MG TOTAL) BY MOUTH EVERY 6 (SIX) HOURS AS NEEDED (PALPITATIONS). 120 tablet 3   ENSURE PLUS (ENSURE PLUS) LIQD Take 350 mLs by mouth daily.      ergocalciferol (VITAMIN D2) 50000 UNITS capsule Take 50,000 Units by mouth See admin instructions. Takes one capsule on weeks 1 through 3 of each month on Saturdays and then one capsule on week 4 on Mondays and Thursdays. (for a total of 5 capsules per month).     escitalopram (LEXAPRO) 20 MG tablet Take 1 half tablet by mouth and 10 mg tablet (15 mg total) daily     esomeprazole (NEXIUM) 20 MG capsule Take 20 mg by mouth at bedtime.     meclizine (ANTIVERT) 25 MG tablet Take 1 tablet (25 mg total) by mouth daily as needed for dizziness  or nausea. 90 tablet 3   polyethylene glycol (MIRALAX / GLYCOLAX) packet Take 17 g by mouth as needed for mild constipation.      pravastatin (PRAVACHOL) 20 MG tablet Take 20 mg by mouth at bedtime.     Multiple Vitamins-Minerals (CENTRUM SILVER 50+WOMEN) TABS Take 1 capsule by mouth daily.  (Patient not taking: Reported on 11/10/2020)     No current facility-administered medications for this visit.    Allergies as of 11/10/2020 - Review Complete 11/10/2020  Allergen Reaction Noted   Penicillins Swelling 07/17/2007  Sulfonamide derivatives Hives and Rash 07/17/2007   Iodinated diagnostic agents Other (See Comments) 07/05/2013   Metronidazole Diarrhea and Other (See Comments) 07/11/2012   Prednisone Other (See Comments) 07/11/2012    Family History  Problem Relation Age of Onset   Glaucoma Mother    Macular degeneration Mother    Stroke Mother    Hypertension Mother    Lung cancer Father    Heart attack Father    Cancer Father    Lung cancer Brother    Non-Hodgkin's lymphoma Brother    Diabetes Brother    Atrial fibrillation Brother    Tuberculosis Paternal Grandfather    Colon cancer Paternal Grandmother 18   Cancer Paternal Grandmother    Heart attack Maternal Grandfather 72   Cancer Other    Breast cancer Neg Hx    Colon polyps Neg Hx    Esophageal cancer Neg Hx    Rectal cancer Neg Hx    Stomach cancer Neg Hx     Social History   Socioeconomic History   Marital status: Widowed    Spouse name: Not on file   Number of children: Not on file   Years of education: Not on file   Highest education level: Not on file  Occupational History   Not on file  Tobacco Use   Smoking status: Never   Smokeless tobacco: Never  Vaping Use   Vaping Use: Never used  Substance and Sexual Activity   Alcohol use: No   Drug use: No   Sexual activity: Not on file    Comment: Hysterectomy  Other Topics Concern   Not on file  Social History Narrative   Patient lives alone and  has no children or relatives in [place]   Social Determinants of Health   Financial Resource Strain: Not on file  Food Insecurity: Not on file  Transportation Needs: Not on file  Physical Activity: Not on file  Stress: Not on file  Social Connections: Not on file  Intimate Partner Violence: Not on file     Physical Exam: BP 106/70   Pulse (!) 46   Ht 5\' 3"  (1.6 m)   Wt 145 lb (65.8 kg)   LMP  (LMP Unknown)   BMI 25.69 kg/m  Constitutional: generally well-appearing Psychiatric: alert and oriented x3 Eyes: extraocular movements intact Mouth: oral pharynx moist, no lesions Neck: supple no lymphadenopathy Cardiovascular: heart regular rate and rhythm Lungs: clear to auscultation bilaterally Abdomen: soft, nontender, nondistended, no obvious ascites, no peritoneal signs, normal bowel sounds Extremities: no lower extremity edema bilaterally Skin: no lesions on visible extremities   Assessment and plan: 80 y.o. female with bloating, change in bowel habits  I think that her very high intake of a lot of fruits that were unique to her over several days caused her to have these changes in her bowels and bloating.  That high fruit intake was about 3 weeks ago.  It is taking some time for her system to repeat equilibrate but I reassured her that I think it will.  I recommend she slowly reintroduce the diet that she was used to for some many years and feeling so well on.  I am getting some basic blood work today for her just to make sure we are not missing something else including CBC, complete metabolic profile and TSH level.  She will return to see [place] at follow-up appointment in 5 or 6 weeks ago with her primary gastroenterologist Dr. Tarri Glenn  Please see the "  Patient Instructions" section for addition details about the plan.   Owens Loffler, MD [facility] 11/10/2020, 11:35 AM  Cc: Crist Infante, MD  Total time on date of encounter was 45 minutes (this included time  spent preparing to see the patient reviewing records; obtaining and/or reviewing separately obtained history; performing a medically appropriate exam and/or evaluation; counseling and educating the patient and family if present; ordering medications, tests or procedures if applicable; and documenting clinical information in the health record).

## 2020-11-18 ENCOUNTER — Other Ambulatory Visit: Payer: Self-pay

## 2020-11-18 ENCOUNTER — Telehealth: Payer: Self-pay | Admitting: Gastroenterology

## 2020-11-18 DIAGNOSIS — R1030 Lower abdominal pain, unspecified: Secondary | ICD-10-CM

## 2020-11-18 DIAGNOSIS — M810 Age-related osteoporosis without current pathological fracture: Secondary | ICD-10-CM | POA: Diagnosis not present

## 2020-11-18 DIAGNOSIS — K5901 Slow transit constipation: Secondary | ICD-10-CM

## 2020-11-18 MED ORDER — POLYETHYLENE GLYCOL 3350 17 G PO PACK: PACK | ORAL | Status: DC

## 2020-11-18 MED ORDER — DOCUSATE SODIUM 100 MG PO CAPS: 100.0000 mg | ORAL_CAPSULE | Freq: Two times a day (BID) | ORAL | 0 refills | Status: AC

## 2020-11-18 NOTE — Telephone Encounter (Signed)
Called pt to further inquire about her abd pain. States she developed lower abd pain/ache yesterday, along with bloating that resulted in her passing 2 large firm stools. States her pain subsided until today. States her lower abd pain and bloating returned today followed by her passing a very large firm stool, then immediately passing loose stool thereafter. Denies needing to strain upon defecation or having any signs of rectal pain or bleeding. Admitted she has not been drinking quite enough fluids and has been relying on Pepcid OTC as well as Mylanta OTC to help with her abd pain. Given the location of her abd pain as well as her constipation, advised these medications would not be as beneficial to her to help relieve her symptoms. While Mylanta may help with her bloating, advised she would benefit from other alternatives. Verbalized appreciation for this advice. Provided the following recommendations:  Increase fluid intake to 64 or more ounces per day Add 1 dose of Miralax daily Add 1 dose of stool softener daily Increase activity and try to exercise regularly Keep a food journal for better identification of food triggers resulting in abd pain and bloating Recommend foods higher in dietary fiber (whole grains, fruits and vegetables)  Advised pt to call the office if her symptoms worsen. Strongly encouraged pt to keep f/u appt with Dr. Tarri Glenn as scheduled. Verbalized acceptance and understanding. Requested I send this information via My Chart.

## 2020-11-20 ENCOUNTER — Telehealth: Payer: Self-pay | Admitting: Gastroenterology

## 2020-11-20 ENCOUNTER — Encounter: Payer: Self-pay | Admitting: Gastroenterology

## 2020-11-20 NOTE — Telephone Encounter (Signed)
Called pt and informed to ensure she drinks Pedialyte and/or Gatorade as indicated below. Verbalized acceptance and understanding.

## 2020-11-20 NOTE — Telephone Encounter (Signed)
Routing this message to Dr. Tarri Glenn to make her aware of pt overdose and nursing instructions.

## 2020-11-20 NOTE — Telephone Encounter (Signed)
Returned pt call. Pt states she "made a mistake" and is now "scared". Further added, "I took an entire 7 day bottle of Miralax and added it to 8 ounces of water", continued to state, "I thought it was awfully thick but drank it anyway". Pt proceeded to add, "I had horrible loose stools and horrible abd pain". Educated pt that what she is experienced would be an expected outcome as the bottle was intended for daily use over 7 DAYS. Advised pt to take Tylenol for abd pain, may use heating pad from abd cramping, increased fluid intake to reduce risk for dehydration and to STOP use of Miralax until her stools begin to become firm. Strongly advised to NOT continue use of Miralax until she feels the urge to strain to defecate, at which time she may continue 1 dose daily. In addition, pt has been educated to purchase PACKETS of Miralax to reduce future risk for overdosing. If she is not able to find PACKETS, advised 1 dose is equivalent to 1 capful. Further educated that entails pouring the powder into the purple cap of the Miralax bottle AND to only pour powder until it reaches the white line on the interior of the cap. Advised she will then pour that capful into 8 ounces of her beverage of choice. Verbalized acceptance and understanding of all information and education provided today. Asked pt to read back my instructions and was able to do with success and understanding.

## 2020-11-20 NOTE — Telephone Encounter (Signed)
Thank you for the update. I would recommend that she drink some Gatorade or Pedialyte to counter balance the diarrhea resulting from all of that Miralax. Thanks.

## 2020-11-20 NOTE — Telephone Encounter (Signed)
Inbound call from pt requesting a call back stating that she took some Miralax which had about 7 doses in the bottle with one cup of water. Now pt is experiencing some abd pain. Please advise. Thank you.

## 2020-11-22 ENCOUNTER — Emergency Department (HOSPITAL_BASED_OUTPATIENT_CLINIC_OR_DEPARTMENT_OTHER): Payer: Medicare PPO

## 2020-11-22 ENCOUNTER — Encounter (HOSPITAL_BASED_OUTPATIENT_CLINIC_OR_DEPARTMENT_OTHER): Payer: Self-pay

## 2020-11-22 ENCOUNTER — Inpatient Hospital Stay (HOSPITAL_BASED_OUTPATIENT_CLINIC_OR_DEPARTMENT_OTHER)
Admission: EM | Admit: 2020-11-22 | Discharge: 2020-11-25 | DRG: 392 | Disposition: A | Discharge status: Home / Self Care | Payer: Medicare PPO | Attending: Internal Medicine | Admitting: Internal Medicine

## 2020-11-22 ENCOUNTER — Other Ambulatory Visit: Payer: Self-pay

## 2020-11-22 DIAGNOSIS — E7800 Pure hypercholesterolemia, unspecified: Secondary | ICD-10-CM | POA: Diagnosis present

## 2020-11-22 DIAGNOSIS — M797 Fibromyalgia: Secondary | ICD-10-CM | POA: Diagnosis present

## 2020-11-22 DIAGNOSIS — Z20822 Contact with and (suspected) exposure to covid-19: Secondary | ICD-10-CM | POA: Diagnosis present

## 2020-11-22 DIAGNOSIS — I493 Ventricular premature depolarization: Secondary | ICD-10-CM | POA: Diagnosis present

## 2020-11-22 DIAGNOSIS — Z833 Family history of diabetes mellitus: Secondary | ICD-10-CM

## 2020-11-22 DIAGNOSIS — K5792 Diverticulitis of intestine, part unspecified, without perforation or abscess without bleeding: Secondary | ICD-10-CM | POA: Diagnosis present

## 2020-11-22 DIAGNOSIS — Z888 Allergy status to other drugs, medicaments and biological substances status: Secondary | ICD-10-CM

## 2020-11-22 DIAGNOSIS — E039 Hypothyroidism, unspecified: Secondary | ICD-10-CM | POA: Diagnosis present

## 2020-11-22 DIAGNOSIS — Z7982 Long term (current) use of aspirin: Secondary | ICD-10-CM | POA: Diagnosis not present

## 2020-11-22 DIAGNOSIS — K5713 Diverticulitis of small intestine without perforation or abscess with bleeding: Secondary | ICD-10-CM | POA: Diagnosis not present

## 2020-11-22 DIAGNOSIS — Z91041 Radiographic dye allergy status: Secondary | ICD-10-CM | POA: Diagnosis not present

## 2020-11-22 DIAGNOSIS — R002 Palpitations: Secondary | ICD-10-CM | POA: Diagnosis present

## 2020-11-22 DIAGNOSIS — Z23 Encounter for immunization: Secondary | ICD-10-CM | POA: Diagnosis present

## 2020-11-22 DIAGNOSIS — K5740 Diverticulitis of both small and large intestine with perforation and abscess without bleeding: Secondary | ICD-10-CM | POA: Diagnosis present

## 2020-11-22 DIAGNOSIS — Z8673 Personal history of transient ischemic attack (TIA), and cerebral infarction without residual deficits: Secondary | ICD-10-CM

## 2020-11-22 DIAGNOSIS — K219 Gastro-esophageal reflux disease without esophagitis: Secondary | ICD-10-CM | POA: Diagnosis present

## 2020-11-22 DIAGNOSIS — I10 Essential (primary) hypertension: Secondary | ICD-10-CM | POA: Diagnosis present

## 2020-11-22 DIAGNOSIS — Z880 Allergy status to penicillin: Secondary | ICD-10-CM | POA: Diagnosis not present

## 2020-11-22 DIAGNOSIS — Z8249 Family history of ischemic heart disease and other diseases of the circulatory system: Secondary | ICD-10-CM | POA: Diagnosis not present

## 2020-11-22 DIAGNOSIS — Z79899 Other long term (current) drug therapy: Secondary | ICD-10-CM

## 2020-11-22 DIAGNOSIS — K589 Irritable bowel syndrome without diarrhea: Secondary | ICD-10-CM | POA: Diagnosis present

## 2020-11-22 DIAGNOSIS — K5720 Diverticulitis of large intestine with perforation and abscess without bleeding: Secondary | ICD-10-CM | POA: Diagnosis not present

## 2020-11-22 DIAGNOSIS — K5730 Diverticulosis of large intestine without perforation or abscess without bleeding: Secondary | ICD-10-CM | POA: Diagnosis not present

## 2020-11-22 DIAGNOSIS — K5780 Diverticulitis of intestine, part unspecified, with perforation and abscess without bleeding: Secondary | ICD-10-CM

## 2020-11-22 DIAGNOSIS — Z823 Family history of stroke: Secondary | ICD-10-CM

## 2020-11-22 DIAGNOSIS — F411 Generalized anxiety disorder: Secondary | ICD-10-CM | POA: Diagnosis present

## 2020-11-22 LAB — LIPASE, BLOOD: Lipase: 10 U/L — ABNORMAL LOW (ref 11–51)

## 2020-11-22 LAB — URINALYSIS, ROUTINE W REFLEX MICROSCOPIC
Bilirubin Urine: NEGATIVE
Glucose, UA: NEGATIVE mg/dL
Nitrite: NEGATIVE
Protein, ur: 30 mg/dL — AB
Specific Gravity, Urine: 1.03 (ref 1.005–1.030)
pH: 6 (ref 5.0–8.0)

## 2020-11-22 LAB — CBC WITH DIFFERENTIAL/PLATELET
Abs Immature Granulocytes: 0.07 10*3/uL (ref 0.00–0.07)
Basophils Absolute: 0.1 10*3/uL (ref 0.0–0.1)
Basophils Relative: 1 %
Eosinophils Absolute: 0.1 10*3/uL (ref 0.0–0.5)
Eosinophils Relative: 1 %
HCT: 40.9 % (ref 36.0–46.0)
Hemoglobin: 13.6 g/dL (ref 12.0–15.0)
Immature Granulocytes: 1 %
Lymphocytes Relative: 21 %
Lymphs Abs: 1.6 10*3/uL (ref 0.7–4.0)
MCH: 30.7 pg (ref 26.0–34.0)
MCHC: 33.3 g/dL (ref 30.0–36.0)
MCV: 92.3 fL (ref 80.0–100.0)
Monocytes Absolute: 0.8 10*3/uL (ref 0.1–1.0)
Monocytes Relative: 11 %
Neutro Abs: 5.2 10*3/uL (ref 1.7–7.7)
Neutrophils Relative %: 65 %
Platelets: 258 10*3/uL (ref 150–400)
RBC: 4.43 MIL/uL (ref 3.87–5.11)
RDW: 12.5 % (ref 11.5–15.5)
WBC: 7.9 10*3/uL (ref 4.0–10.5)
nRBC: 0 % (ref 0.0–0.2)

## 2020-11-22 LAB — COMPREHENSIVE METABOLIC PANEL
ALT: 9 U/L (ref 0–44)
AST: 11 U/L — ABNORMAL LOW (ref 15–41)
Albumin: 3.9 g/dL (ref 3.5–5.0)
Alkaline Phosphatase: 48 U/L (ref 38–126)
Anion gap: 11 (ref 5–15)
BUN: 18 mg/dL (ref 8–23)
CO2: 27 mmol/L (ref 22–32)
Calcium: 9 mg/dL (ref 8.9–10.3)
Chloride: 100 mmol/L (ref 98–111)
Creatinine, Ser: 0.88 mg/dL (ref 0.44–1.00)
GFR, Estimated: >60 mL/min (ref >60)
Glucose, Bld: 97 mg/dL (ref 70–99)
Potassium: 3.4 mmol/L — ABNORMAL LOW (ref 3.5–5.1)
Sodium: 138 mmol/L (ref 135–145)
Total Bilirubin: 0.4 mg/dL (ref 0.3–1.2)
Total Protein: 7 g/dL (ref 6.5–8.1)

## 2020-11-22 LAB — RESP PANEL BY RT-PCR (FLU A&B, COVID) ARPGX2
Influenza A by PCR: NEGATIVE
Influenza B by PCR: NEGATIVE
SARS Coronavirus 2 by RT PCR: NEGATIVE

## 2020-11-22 MED ORDER — SODIUM CHLORIDE 0.9 % IV SOLN
1.0000 g | Freq: Once | INTRAVENOUS | Status: AC
  Administered 2020-11-22: 1 g via INTRAVENOUS

## 2020-11-22 MED ORDER — METRONIDAZOLE 500 MG/100ML IV SOLN
500.0000 mg | Freq: Once | INTRAVENOUS | Status: AC
  Administered 2020-11-22: 500 mg via INTRAVENOUS
  Filled 2020-11-22: qty 100

## 2020-11-22 NOTE — Consult Note (Addendum)
CC: Ileal diverticulitis  Requesting provider: Dr. Tamera Punt  HPI: [name] is an 80 y.o. female with hx of HTN, HLD, GERD, TIA, syncope, PUD, whom presented to ED at [facility] for abdominal symptoms of sharp/crampy pains that have been present for 3 weeks but become more pronounced. Pain was localized in LLQ when it was sharp. Pain is now much more generalized and milder, feels more just like soreness. Patient denies nausea or vomiting and actually reports she feels a little hungry. She reports she had a few peanut butter crackers earlier. She is passing flatus. She does have a notable history with GI - follows with Dr. Tarri Glenn - but inadvertently seen by Dr. Ardis Hughs 11/10/20 with cramping/bloating. Past abdominal surgery includes abdominal hysterectomy and laparoscopic appendectomy. She takes ASA but no other blood thinners. Allergic to PCNs and sulfa abx. Patient lives alone but is very independent.   Past Medical History:  Diagnosis Date   Allergy    Anxiety    Appendicitis    Back abscess    Carotid stenosis    R 1-39; L no stenosis >> rpt 12/2020 // carotid US 9/21: No significant ICA stenosis bilaterally   Cataract    just watching - bilateral   Chigger bites 10/31/2017   per pt, she has over 50 chigger bites!   Diverticulitis    GERD (gastroesophageal reflux disease)    History of kidney stones    first stone now   History of syncope    Echocardiogram 9/21: EF 60-65, no RWMA, GR 1 DD, normal RVSF, RVSP 20.5   Hx of adenomatous colonic polyps    Hx of cardiovascular stress test    ETT-Myoview (8/15): Poor exercise capacity, no ischemia, EF 61%-normal study   Hyperlipidemia    Hypertension    IBS (irritable bowel syndrome)    Lichen planus    Osteoporosis    PUD (peptic ulcer disease)    PVC (premature ventricular contraction)    Stroke (HCC)    Thyroid disease    TIA (transient ischemic attack) summer 2015    Past Surgical History:  Procedure Laterality  Date   ABDOMINAL HYSTERECTOMY     complete   APPENDECTOMY     CARPAL TUNNEL RELEASE     left   COLONOSCOPY     polyps   CYSTOSCOPY WITH RETROGRADE PYELOGRAM, URETEROSCOPY AND STENT PLACEMENT Left 07/06/2015   Procedure: CYSTOSCOPY WITH LEFT RETROGRADE PYELOGRAM AND INTERPRETATION, LEFT URETEROSCOPY WITH DUAL LUMEN PYELOSCOPY, IDENTIFCATION AND DILATION OF URETERAL STRICTURE, JJ STENT REMOVAL, BASKET EXTRACTION OF RENAL PELVIS STONE, EXTRACTION OF ORGANIZED RENAL PELVIC BLOOD CLOT;  Surgeon: Carolan Clines, MD;  Location: [facility];  Service: Urology;  Laterality: Left;  PLACEMENT OF LEFT DOUBLE J STEN   CYSTOSCOPY/RETROGRADE/URETEROSCOPY/STONE EXTRACTION WITH BASKET Left 05/08/2015   Procedure: CYSTOSCOPY/RETROGRADE/URETEROSCOPY/JJ STENT PLACEMENT;  Surgeon: Carolan Clines, MD;  Location: [facility];  Service: Urology;  Laterality: Left;   WRIST SURGERY     left    Family History  Problem Relation Age of Onset   Glaucoma Mother    Macular degeneration Mother    Stroke Mother    Hypertension Mother    Lung cancer Father    Heart attack Father    Cancer Father    Lung cancer Brother    Non-Hodgkin's lymphoma Brother    Diabetes Brother    Atrial fibrillation Brother    Tuberculosis Paternal Grandfather    Colon cancer Paternal Grandmother 8   Cancer Paternal Grandmother  Heart attack Maternal Grandfather 72   Cancer Other    Breast cancer Neg Hx    Colon polyps Neg Hx    Esophageal cancer Neg Hx    Rectal cancer Neg Hx    Stomach cancer Neg Hx     Social:  reports that she has never smoked. She has never used smokeless tobacco. She reports that she does not drink alcohol and does not use drugs.  Allergies:  Allergies  Allergen Reactions   Penicillins Swelling    By second day there was swelling of tongue and could not breathe Has patient had a PCN reaction causing immediate rash, facial/tongue/throat swelling, SOB or lightheadedness with hypotension: yes Has patient  had a PCN reaction causing severe rash involving mucus membranes or skin necrosis: no Has patient had a PCN reaction that required hospitalization no Has patient had a PCN reaction occurring within the last 10 years:no If all of the above answers are "NO", then may proceed with Cephalosporin use   Sulfonamide Derivatives Hives and Rash    Where applied.   Iodinated Diagnostic Agents Other (See Comments)    Unknown allergy to unknown contrast during gb test many yrs ago//a.calhoun   Metronidazole Diarrhea and Other (See Comments)    Hyper, decreases appetite   Prednisone Other (See Comments)    Due to cataract on eye and history of PVC's    Medications: I have reviewed the patient's current medications.  Results for orders placed or performed during the hospital encounter of 11/22/20 (from the past 48 hour(s))  Urinalysis, Routine w reflex microscopic Urine, Clean Catch     Status: Abnormal   Collection Time: 11/22/20  8:35 PM  Result Value Ref Range   Color, Urine YELLOW YELLOW   APPearance HAZY (A) CLEAR   Specific Gravity, Urine 1.030 1.005 - 1.030   pH 6.0 5.0 - 8.0   Glucose, UA NEGATIVE NEGATIVE mg/dL   Hgb urine dipstick SMALL (A) NEGATIVE   Bilirubin Urine NEGATIVE NEGATIVE   Ketones, ur TRACE (A) NEGATIVE mg/dL   Protein, ur 30 (A) NEGATIVE mg/dL   Nitrite NEGATIVE NEGATIVE   Leukocytes,Ua TRACE (A) NEGATIVE   RBC / HPF 11-20 0 - 5 RBC/hpf   WBC, UA 6-10 0 - 5 WBC/hpf   Bacteria, UA RARE (A) NONE SEEN   Squamous Epithelial / LPF 21-50 0 - 5   Mucus PRESENT    Hyaline Casts, UA PRESENT     Comment: Performed at [facility], [address] [place], [place] [address]  Comprehensive metabolic panel     Status: Abnormal   Collection Time: 11/22/20  8:42 PM  Result Value Ref Range   Sodium 138 135 - 145 mmol/L   Potassium 3.4 (L) 3.5 - 5.1 mmol/L   Chloride 100 98 - 111 mmol/L   CO2 27 22 - 32 mmol/L   Glucose, Bld 97 70 - 99 mg/dL    Comment:  Glucose reference range applies only to samples taken after fasting for at least 8 hours.   BUN 18 8 - 23 mg/dL   Creatinine, Ser 0.88 0.44 - 1.00 mg/dL   Calcium 9.0 8.9 - 10.3 mg/dL   Total Protein 7.0 6.5 - 8.1 g/dL   Albumin 3.9 3.5 - 5.0 g/dL   AST 11 (L) 15 - 41 U/L   ALT 9 0 - 44 U/L   Alkaline Phosphatase 48 38 - 126 U/L   Total Bilirubin 0.4 0.3 - 1.2 mg/dL   GFR,  Estimated >60 >60 mL/min    Comment: (NOTE) Calculated using the CKD-EPI Creatinine Equation (2021)    Anion gap 11 5 - 15    Comment: Performed at [facility], [address], [place], [place] [address]  Lipase, blood     Status: Abnormal   Collection Time: 11/22/20  8:42 PM  Result Value Ref Range   Lipase 10 (L) 11 - 51 U/L    Comment: Performed at [facility], [address] [place], [place] [address]  CBC with Differential     Status: None   Collection Time: 11/22/20  8:42 PM  Result Value Ref Range   WBC 7.9 4.0 - 10.5 K/uL   RBC 4.43 3.87 - 5.11 MIL/uL   Hemoglobin 13.6 12.0 - 15.0 g/dL   HCT 40.9 36.0 - 46.0 %   MCV 92.3 80.0 - 100.0 fL   MCH 30.7 26.0 - 34.0 pg   MCHC 33.3 30.0 - 36.0 g/dL   RDW 12.5 11.5 - 15.5 %   Platelets 258 150 - 400 K/uL   nRBC 0.0 0.0 - 0.2 %   Neutrophils Relative % 65 %   Neutro Abs 5.2 1.7 - 7.7 K/uL   Lymphocytes Relative 21 %   Lymphs Abs 1.6 0.7 - 4.0 K/uL   Monocytes Relative 11 %   Monocytes Absolute 0.8 0.1 - 1.0 K/uL   Eosinophils Relative 1 %   Eosinophils Absolute 0.1 0.0 - 0.5 K/uL   Basophils Relative 1 %   Basophils Absolute 0.1 0.0 - 0.1 K/uL   Immature Granulocytes 1 %   Abs Immature Granulocytes 0.07 0.00 - 0.07 K/uL    Comment: Performed at [facility], [address], [place], [place] [address]    CT Abdomen Pelvis Wo Contrast  Result Date: 11/22/2020 CLINICAL DATA:  Acute abdominal pain. EXAM: CT ABDOMEN AND PELVIS WITHOUT CONTRAST TECHNIQUE: Multidetector CT imaging of the  abdomen and pelvis was performed following the standard protocol without IV contrast. COMPARISON:  CT renal stone 04/28/2015. FINDINGS: Lower chest: There is some linear scarring or atelectasis in the lung bases. Hepatobiliary: There is a rounded hypodensity in the dome of the liver which is too small to characterize and unchanged, possibly a cyst or hemangioma. The liver, gallbladder, and bile ducts are otherwise within normal limits. Pancreas: Unremarkable. No pancreatic ductal dilatation or surrounding inflammatory changes. Spleen: Normal in size without focal abnormality. Adrenals/Urinary Tract: Adrenal glands are unremarkable. Kidneys are normal, without renal calculi, focal lesion, or hydronephrosis. Bladder is unremarkable. Stomach/Bowel: There are diverticula within the distal ileum. There is a focal area of wall thickening with surrounding inflammation compatible with diverticulitis. There is small amount of free air compatible with perforation. No evidence for abscess. No bowel obstruction. There is colonic diverticulosis without evidence for acute diverticulitis. Appendix is not visualized stomach is within normal limits. Vascular/Lymphatic: Aortic atherosclerosis. No enlarged abdominal or pelvic lymph nodes. Reproductive: Status post hysterectomy. No adnexal masses. Other: No abdominal wall hernia or abnormality. No abdominopelvic ascites. Musculoskeletal: No acute or significant osseous findings. IMPRESSION: 1. Acute ileal diverticulitis with perforation. No evidence for abscess. 2. Colonic diverticulosis without evidence for acute diverticulitis. Electronically Signed   By: Ronney Asters M.D.   On: 11/22/2020 21:49    ROS - all of the below systems have been reviewed with the patient and positives are indicated with bold text General: chills, fever or night sweats Eyes: blurry vision or double vision ENT: epistaxis or sore throat Allergy/Immunology:  itchy/watery eyes or nasal  congestion Hematologic/Lymphatic: bleeding problems, blood clots or swollen lymph nodes Endocrine: temperature intolerance or unexpected weight changes Breast: new or changing breast lumps or nipple discharge Resp: cough, shortness of breath, or wheezing CV: chest pain or dyspnea on exertion GI: as per HPI GU: dysuria, trouble voiding, or hematuria MSK: joint pain or joint stiffness Neuro: TIA or stroke symptoms Derm: pruritus and skin lesion changes Psych: anxiety and depression  PE Blood pressure 137/72, pulse 74, temperature 98 F (36.7 C), temperature source Oral, resp. rate 18, SpO2 99 %. General: pleasant, WD, WN female who is laying in bed in NAD HEENT: head is normocephalic, atraumatic.  Sclera are noninjected.  Ears and nose without any masses or lesions.  Mouth is pink and moist Heart: regular, rate, and rhythm.  Normal s1,s2. No obvious murmurs, gallops, or rubs noted.  Palpable radial and pedal pulses bilaterally Lungs: CTAB, no wheezes, rhonchi, or rales noted.  Respiratory effort nonlabored Abd: soft, NT, ND, +BS, no masses, hernias, or organomegaly MS: all 4 extremities are symmetrical with no cyanosis, clubbing, or edema. Skin: warm and dry with no masses, lesions, or rashes Neuro: Cranial nerves 2-12 grossly intact, sensation is normal throughout Psych: A&Ox3 with an appropriate affect.   Results for orders placed or performed during the hospital encounter of 11/22/20 (from the past 48 hour(s))  Urinalysis, Routine w reflex microscopic Urine, Clean Catch     Status: Abnormal   Collection Time: 11/22/20  8:35 PM  Result Value Ref Range   Color, Urine YELLOW YELLOW   APPearance HAZY (A) CLEAR   Specific Gravity, Urine 1.030 1.005 - 1.030   pH 6.0 5.0 - 8.0   Glucose, UA NEGATIVE NEGATIVE mg/dL   Hgb urine dipstick SMALL (A) NEGATIVE   Bilirubin Urine NEGATIVE NEGATIVE   Ketones, ur TRACE (A) NEGATIVE mg/dL   Protein, ur 30 (A) NEGATIVE mg/dL   Nitrite NEGATIVE  NEGATIVE   Leukocytes,Ua TRACE (A) NEGATIVE   RBC / HPF 11-20 0 - 5 RBC/hpf   WBC, UA 6-10 0 - 5 WBC/hpf   Bacteria, UA RARE (A) NONE SEEN   Squamous Epithelial / LPF 21-50 0 - 5   Mucus PRESENT    Hyaline Casts, UA PRESENT     Comment: Performed at [facility], [address] [place], [place] [address]  Comprehensive metabolic panel     Status: Abnormal   Collection Time: 11/22/20  8:42 PM  Result Value Ref Range   Sodium 138 135 - 145 mmol/L   Potassium 3.4 (L) 3.5 - 5.1 mmol/L   Chloride 100 98 - 111 mmol/L   CO2 27 22 - 32 mmol/L   Glucose, Bld 97 70 - 99 mg/dL    Comment: Glucose reference range applies only to samples taken after fasting for at least 8 hours.   BUN 18 8 - 23 mg/dL   Creatinine, Ser 0.88 0.44 - 1.00 mg/dL   Calcium 9.0 8.9 - 10.3 mg/dL   Total Protein 7.0 6.5 - 8.1 g/dL   Albumin 3.9 3.5 - 5.0 g/dL   AST 11 (L) 15 - 41 U/L   ALT 9 0 - 44 U/L   Alkaline Phosphatase 48 38 - 126 U/L   Total Bilirubin 0.4 0.3 - 1.2 mg/dL   GFR, Estimated >60 >60 mL/min    Comment: (NOTE) Calculated using the CKD-EPI Creatinine Equation (2021)    Anion gap 11 5 - 15    Comment: Performed at [facility], [address], [place], [place] [address]  Lipase, blood     Status: Abnormal   Collection Time: 11/22/20  8:42 PM  Result Value Ref Range   Lipase 10 (L) 11 - 51 U/L    Comment: Performed at [facility], [place], [place] [address]  CBC with Differential     Status: None   Collection Time: 11/22/20  8:42 PM  Result Value Ref Range   WBC 7.9 4.0 - 10.5 K/uL   RBC 4.43 3.87 - 5.11 MIL/uL   Hemoglobin 13.6 12.0 - 15.0 g/dL   HCT 40.9 36.0 - 46.0 %   MCV 92.3 80.0 - 100.0 fL   MCH 30.7 26.0 - 34.0 pg   MCHC 33.3 30.0 - 36.0 g/dL   RDW 12.5 11.5 - 15.5 %   Platelets 258 150 - 400 K/uL   nRBC 0.0 0.0 - 0.2 %   Neutrophils Relative % 65 %   Neutro Abs 5.2 1.7 - 7.7 K/uL   Lymphocytes Relative  21 %   Lymphs Abs 1.6 0.7 - 4.0 K/uL   Monocytes Relative 11 %   Monocytes Absolute 0.8 0.1 - 1.0 K/uL   Eosinophils Relative 1 %   Eosinophils Absolute 0.1 0.0 - 0.5 K/uL   Basophils Relative 1 %   Basophils Absolute 0.1 0.0 - 0.1 K/uL   Immature Granulocytes 1 %   Abs Immature Granulocytes 0.07 0.00 - 0.07 K/uL    Comment: Performed at [facility], [address] [place], [place] [address]    CT Abdomen Pelvis Wo Contrast  Result Date: 11/22/2020 CLINICAL DATA:  Acute abdominal pain. EXAM: CT ABDOMEN AND PELVIS WITHOUT CONTRAST TECHNIQUE: Multidetector CT imaging of the abdomen and pelvis was performed following the standard protocol without IV contrast. COMPARISON:  CT renal stone 04/28/2015. FINDINGS: Lower chest: There is some linear scarring or atelectasis in the lung bases. Hepatobiliary: There is a rounded hypodensity in the dome of the liver which is too small to characterize and unchanged, possibly a cyst or hemangioma. The liver, gallbladder, and bile ducts are otherwise within normal limits. Pancreas: Unremarkable. No pancreatic ductal dilatation or surrounding inflammatory changes. Spleen: Normal in size without focal abnormality. Adrenals/Urinary Tract: Adrenal glands are unremarkable. Kidneys are normal, without renal calculi, focal lesion, or hydronephrosis. Bladder is unremarkable. Stomach/Bowel: There are diverticula within the distal ileum. There is a focal area of wall thickening with surrounding inflammation compatible with diverticulitis. There is small amount of free air compatible with perforation. No evidence for abscess. No bowel obstruction. There is colonic diverticulosis without evidence for acute diverticulitis. Appendix is not visualized stomach is within normal limits. Vascular/Lymphatic: Aortic atherosclerosis. No enlarged abdominal or pelvic lymph nodes. Reproductive: Status post hysterectomy. No adnexal masses. Other: No abdominal wall hernia or  abnormality. No abdominopelvic ascites. Musculoskeletal: No acute or significant osseous findings. IMPRESSION: 1. Acute ileal diverticulitis with perforation. No evidence for abscess. 2. Colonic diverticulosis without evidence for acute diverticulitis. Electronically Signed   By: Ronney Asters M.D.   On: 11/22/2020 21:49     A/P: [name] is an 80 y.o. female  ileal diverticulitis with what appears to be a contained 'microperforation' - Afebrile, normal WBC, CT shows this to be relatively contained - I do not see any moderate/large volume pneumoperitoneum or any significant free fluid in the abdomen - IV abx and ok to have FLD - mobilize as tolerated - repeat labs in AM - We will  follow with you  FEN: FLD, IVF per primary  VTE: SCDs, ok to have LMWH or SQ heparin ID: [email or web address]  HTN HLD GERD TIA Syncope PUD  Norm Parcel, [facility] 11/23/2020, 3:31 PM Please see Amion for pager number during day hours 7:00am-4:30pm

## 2020-11-22 NOTE — ED Triage Notes (Addendum)
Pt is present for epigastric pain that started this afternoon after eating apples and bananas. One month ago patient started new vitamins and it caused abd pain, patient was told she may have acid reflux and was started on omperozole. Pain did not improve and so she was started on famotidine. That did not work and so she followed up with GI and they gave her other recommendations. Pt thought increasing her fiber was going to help her abd pain. Patient bought a bunch of fruit and she believes that is what is causing her abd pain this evening.

## 2020-11-22 NOTE — ED Provider Notes (Signed)
[facility] EMERGENCY DEPT Provider Note   CSN: [id number] Arrival date & time: 11/22/20  1919     History Chief Complaint  Patient presents with   Abdominal Pain    [name] is a 80 y.o. female.  Patient is a 80 year old female who presents with abdominal pain.  She states her symptoms started about a month ago when she says that she brought every kind of food that contain vitamin B.  She said she ate a lot of fruit and then started having some pain across her upper abdomen.  She saw her PCP and was diagnosed with reflux.  She was started on omeprazole.  It was not getting better so she was switched to famotidine.  She says its been fairly constant but waxing waning over the last few weeks.  She spoke with a GI doctor who recommended increasing her fiber intake which she did.  Today the pain got more severe and diffuse.  She has had some nausea but no vomiting.  No fevers.  She did have some diarrhea yesterday after she took a full bottle of MiraLAX.  She had a small bowel movement this morning.  No known blood in her stool.  She does have a history of diverticulosis.  She denies any urinary symptoms.      Past Medical History:  Diagnosis Date   Allergy    Anxiety    Appendicitis    Back abscess    Carotid stenosis    R 1-39; L no stenosis >> rpt 12/2020 // carotid US 9/21: No significant ICA stenosis bilaterally   Cataract    just watching - bilateral   Chigger bites 10/31/2017   per pt, she has over 50 chigger bites!   Diverticulitis    GERD (gastroesophageal reflux disease)    History of kidney stones    first stone now   History of syncope    Echocardiogram 9/21: EF 60-65, no RWMA, GR 1 DD, normal RVSF, RVSP 20.5   Hx of adenomatous colonic polyps    Hx of cardiovascular stress test    ETT-Myoview (8/15): Poor exercise capacity, no ischemia, EF 61%-normal study   Hyperlipidemia    Hypertension    IBS (irritable bowel syndrome)    Lichen  planus    Osteoporosis    PUD (peptic ulcer disease)    PVC (premature ventricular contraction)    Stroke ([place])    Thyroid disease    TIA (transient ischemic attack) summer 2015    Patient Active Problem List   Diagnosis Date Noted   TIA (transient ischemic attack)    IBS (irritable bowel syndrome)    Non-rheumatic mitral regurgitation 10/26/2016   ATRIAL PREMATURE BEATS 06/17/2009   CARDIOMYOPATHY, SECONDARY 06/09/2009   PALPITATIONS 05/18/2009   HYPERCHOLESTEROLEMIA 07/17/2007   ANXIETY 07/17/2007   PREMATURE VENTRICULAR CONTRACTIONS 07/17/2007   GERD 07/17/2007   PUD 07/17/2007   DIVERTICULITIS OF COLON 07/17/2007   OSTEOPOROSIS 07/17/2007   SLEEP DISORDER 07/17/2007   COLONIC POLYPS, HX OF 07/17/2007   APPENDICITIS, HX OF 07/17/2007    Past Surgical History:  Procedure Laterality Date   ABDOMINAL HYSTERECTOMY     complete   APPENDECTOMY     CARPAL TUNNEL RELEASE     left   COLONOSCOPY     polyps   CYSTOSCOPY WITH RETROGRADE PYELOGRAM, URETEROSCOPY AND STENT PLACEMENT Left 07/06/2015   Procedure: CYSTOSCOPY WITH LEFT RETROGRADE PYELOGRAM AND INTERPRETATION, LEFT URETEROSCOPY WITH DUAL LUMEN PYELOSCOPY, IDENTIFCATION AND DILATION OF URETERAL  STRICTURE, JJ STENT REMOVAL, BASKET EXTRACTION OF RENAL PELVIS STONE, EXTRACTION OF ORGANIZED RENAL PELVIC BLOOD CLOT;  Surgeon: Carolan Clines, MD;  Location: [facility];  Service: Urology;  Laterality: Left;  PLACEMENT OF LEFT DOUBLE J STEN   CYSTOSCOPY/RETROGRADE/URETEROSCOPY/STONE EXTRACTION WITH BASKET Left 05/08/2015   Procedure: CYSTOSCOPY/RETROGRADE/URETEROSCOPY/JJ STENT PLACEMENT;  Surgeon: Carolan Clines, MD;  Location: [facility];  Service: Urology;  Laterality: Left;   WRIST SURGERY     left     OB History   No obstetric history on file.     Family History  Problem Relation Age of Onset   Glaucoma Mother    Macular degeneration Mother    Stroke Mother    Hypertension Mother    Lung cancer Father    Heart  attack Father    Cancer Father    Lung cancer Brother    Non-Hodgkin's lymphoma Brother    Diabetes Brother    Atrial fibrillation Brother    Tuberculosis Paternal Grandfather    Colon cancer Paternal Grandmother 41   Cancer Paternal Grandmother    Heart attack Maternal Grandfather 72   Cancer Other    Breast cancer Neg Hx    Colon polyps Neg Hx    Esophageal cancer Neg Hx    Rectal cancer Neg Hx    Stomach cancer Neg Hx     Social History   Tobacco Use   Smoking status: Never   Smokeless tobacco: Never  Vaping Use   Vaping Use: Never used  Substance Use Topics   Alcohol use: No   Drug use: No    Home Medications Prior to Admission medications   Medication Sig Start Date End Date Taking? Authorizing Provider  acetaminophen (TYLENOL) 500 MG tablet Take 500-1,000 mg by mouth every 6 (six) hours as needed (pain).     [provider]  aspirin EC 81 MG tablet Take 1 tablet (81 mg total) by mouth daily. 12/18/12   Burnell Blanks, MD  Cyanocobalamin (VITAMIN B 12 PO) Take 5,000 mg by mouth daily.    [provider]  denosumab (PROLIA) 60 MG/ML SOSY injection Inject 60 mg into the skin every 6 (six) months.    [provider]  desoximetasone (TOPICORT) 0.25 % cream Apply 1 application topically 2 (two) times daily as needed (lichen planus on legs).     [provider]  dicyclomine (BENTYL) 10 MG capsule Take 1 capsule (10 mg total) by mouth 2 (two) times daily as needed for spasms. 11/18/20   Thornton Park, MD  diltiazem (CARDIZEM) 30 MG tablet TAKE 1 TABLET (30 MG TOTAL) BY MOUTH EVERY 6 (SIX) HOURS AS NEEDED (PALPITATIONS). 01/07/20   Burnell Blanks, MD  docusate sodium (STOOL SOFTENER) 100 MG capsule Take 1 capsule (100 mg total) by mouth 2 (two) times daily. 11/18/20   Thornton Park, MD  ENSURE PLUS (ENSURE PLUS) LIQD Take 350 mLs by mouth daily.     [provider]  ergocalciferol (VITAMIN D2) 50000 UNITS  capsule Take 50,000 Units by mouth See admin instructions. Takes one capsule on weeks 1 through 3 of each month on Saturdays and then one capsule on week 4 on Mondays and Thursdays. (for a total of 5 capsules per month).    [provider]  escitalopram (LEXAPRO) 20 MG tablet Take 1 half tablet by mouth and 10 mg tablet (15 mg total) daily    [provider]  esomeprazole (NEXIUM) 20 MG capsule Take 20 mg by mouth at  bedtime.    [provider]  meclizine (ANTIVERT) 25 MG tablet Take 1 tablet (25 mg total) by mouth daily as needed for dizziness or nausea. 09/16/15   Burnell Blanks, MD  Multiple Vitamins-Minerals (CENTRUM SILVER 50+WOMEN) TABS Take 1 capsule by mouth daily.  Patient not taking: Reported on 11/10/2020 07/15/19   [provider]  polyethylene glycol (MIRALAX / GLYCOLAX) 17 g packet Dissolve 1 capful in 8 ounces of fluid and drink daily 11/18/20   Thornton Park, MD  pravastatin (PRAVACHOL) 20 MG tablet Take 20 mg by mouth at bedtime.    [provider]    Allergies    Penicillins, Sulfonamide derivatives, Iodinated diagnostic agents, Metronidazole, and Prednisone  Review of Systems   Review of Systems  Constitutional:  Negative for chills, diaphoresis, fatigue and fever.  HENT:  Negative for congestion, rhinorrhea and sneezing.   Eyes: Negative.   Respiratory:  Negative for cough, chest tightness and shortness of breath.   Cardiovascular:  Negative for chest pain and leg swelling.  Gastrointestinal:  Positive for abdominal pain, diarrhea and nausea. Negative for blood in stool and vomiting.  Genitourinary:  Negative for difficulty urinating, flank pain, frequency and hematuria.  Musculoskeletal:  Negative for arthralgias and back pain.  Skin:  Negative for rash.  Neurological:  Negative for dizziness, speech difficulty, weakness, numbness and headaches.   Physical Exam Updated Vital Signs BP 137/72   Pulse 74   Temp 98.5  F (36.9 C) (Oral)   Resp 18   LMP  (LMP Unknown)   SpO2 99%   Physical Exam Constitutional:      Appearance: She is well-developed.  HENT:     Head: Normocephalic and atraumatic.  Eyes:     Pupils: Pupils are equal, round, and reactive to light.  Cardiovascular:     Rate and Rhythm: Normal rate and regular rhythm.     Heart sounds: Normal heart sounds.  Pulmonary:     Effort: Pulmonary effort is normal. No respiratory distress.     Breath sounds: Normal breath sounds. No wheezing or rales.  Chest:     Chest wall: No tenderness.  Abdominal:     General: Bowel sounds are normal.     Palpations: Abdomen is soft.     Tenderness: There is generalized abdominal tenderness. There is no guarding or rebound.  Musculoskeletal:        General: Normal range of motion.     Cervical back: Normal range of motion and neck supple.  Lymphadenopathy:     Cervical: No cervical adenopathy.  Skin:    General: Skin is warm and dry.     Findings: No rash.  Neurological:     Mental Status: She is alert and oriented to person, place, and time.    ED Results / Procedures / Treatments   Labs (all labs ordered are listed, but only abnormal results are displayed) Labs Reviewed  COMPREHENSIVE METABOLIC PANEL - Abnormal; Notable for the following components:      Result Value   Potassium 3.4 (*)    AST 11 (*)    All other components within normal limits  LIPASE, BLOOD - Abnormal; Notable for the following components:   Lipase 10 (*)    All other components within normal limits  URINALYSIS, ROUTINE W REFLEX MICROSCOPIC - Abnormal; Notable for the following components:   APPearance HAZY (*)    Hgb urine dipstick SMALL (*)    Ketones, ur TRACE (*)  Protein, ur 30 (*)    Leukocytes,Ua TRACE (*)    Bacteria, UA RARE (*)    All other components within normal limits  RESP PANEL BY RT-PCR (FLU A&B, COVID) ARPGX2  CBC WITH DIFFERENTIAL/PLATELET    EKG None  Radiology CT Abdomen Pelvis Wo  Contrast  Result Date: 11/22/2020 CLINICAL DATA:  Acute abdominal pain. EXAM: CT ABDOMEN AND PELVIS WITHOUT CONTRAST TECHNIQUE: Multidetector CT imaging of the abdomen and pelvis was performed following the standard protocol without IV contrast. COMPARISON:  CT renal stone 04/28/2015. FINDINGS: Lower chest: There is some linear scarring or atelectasis in the lung bases. Hepatobiliary: There is a rounded hypodensity in the dome of the liver which is too small to characterize and unchanged, possibly a cyst or hemangioma. The liver, gallbladder, and bile ducts are otherwise within normal limits. Pancreas: Unremarkable. No pancreatic ductal dilatation or surrounding inflammatory changes. Spleen: Normal in size without focal abnormality. Adrenals/Urinary Tract: Adrenal glands are unremarkable. Kidneys are normal, without renal calculi, focal lesion, or hydronephrosis. Bladder is unremarkable. Stomach/Bowel: There are diverticula within the distal ileum. There is a focal area of wall thickening with surrounding inflammation compatible with diverticulitis. There is small amount of free air compatible with perforation. No evidence for abscess. No bowel obstruction. There is colonic diverticulosis without evidence for acute diverticulitis. Appendix is not visualized stomach is within normal limits. Vascular/Lymphatic: Aortic atherosclerosis. No enlarged abdominal or pelvic lymph nodes. Reproductive: Status post hysterectomy. No adnexal masses. Other: No abdominal wall hernia or abnormality. No abdominopelvic ascites. Musculoskeletal: No acute or significant osseous findings. IMPRESSION: 1. Acute ileal diverticulitis with perforation. No evidence for abscess. 2. Colonic diverticulosis without evidence for acute diverticulitis. Electronically Signed   By: Ronney Asters M.D.   On: 11/22/2020 21:49    Procedures Procedures   Medications Ordered in ED Medications  cefTRIAXone (ROCEPHIN) 1 g in sodium chloride 0.9 % 100  mL IVPB (1 g Intravenous New Bag/Given 11/22/20 2241)  metroNIDAZOLE (FLAGYL) IVPB 500 mg (has no administration in time range)    ED Course  I have reviewed the triage vital signs and the nursing notes.  Pertinent labs & imaging results that were available during my care of the patient were reviewed by me and considered in my medical decision making (see chart for details).    MDM Rules/Calculators/A&P                           Patient is 80 year old who presents with generalized abdominal pain.  She is afebrile.  Her white count is normal.  Her CT scan does show evidence of diverticulitis with perforation.  I spoke with Dr. Dema Severin with general surgery.  He requested medicine admission.  He does say that they will follow along with the patient while she is in the hospital.  At this time he does not feel that surgery is indicated.  She has a prior penicillin allergy.  I did start Flagyl and Rocephin.  I spoke with Dr. Hal Hope who will admit the pt. Final Clinical Impression(s) / ED Diagnoses Final diagnoses:  Diverticulitis of intestine with perforation without bleeding, unspecified part of intestinal tract    Rx / DC Orders ED Discharge Orders     None        Malvin Johns, MD 11/22/20 2243

## 2020-11-23 ENCOUNTER — Encounter (HOSPITAL_COMMUNITY): Payer: Self-pay | Admitting: Internal Medicine

## 2020-11-23 DIAGNOSIS — E039 Hypothyroidism, unspecified: Secondary | ICD-10-CM | POA: Diagnosis present

## 2020-11-23 DIAGNOSIS — I10 Essential (primary) hypertension: Secondary | ICD-10-CM | POA: Diagnosis present

## 2020-11-23 DIAGNOSIS — K5792 Diverticulitis of intestine, part unspecified, without perforation or abscess without bleeding: Secondary | ICD-10-CM | POA: Diagnosis present

## 2020-11-23 DIAGNOSIS — Z20822 Contact with and (suspected) exposure to covid-19: Secondary | ICD-10-CM | POA: Diagnosis present

## 2020-11-23 DIAGNOSIS — K5740 Diverticulitis of both small and large intestine with perforation and abscess without bleeding: Secondary | ICD-10-CM | POA: Diagnosis present

## 2020-11-23 DIAGNOSIS — M797 Fibromyalgia: Secondary | ICD-10-CM | POA: Diagnosis present

## 2020-11-23 DIAGNOSIS — K589 Irritable bowel syndrome without diarrhea: Secondary | ICD-10-CM | POA: Diagnosis present

## 2020-11-23 DIAGNOSIS — I493 Ventricular premature depolarization: Secondary | ICD-10-CM | POA: Diagnosis present

## 2020-11-23 DIAGNOSIS — Z888 Allergy status to other drugs, medicaments and biological substances status: Secondary | ICD-10-CM | POA: Diagnosis not present

## 2020-11-23 DIAGNOSIS — Z880 Allergy status to penicillin: Secondary | ICD-10-CM | POA: Diagnosis not present

## 2020-11-23 DIAGNOSIS — Z823 Family history of stroke: Secondary | ICD-10-CM | POA: Diagnosis not present

## 2020-11-23 DIAGNOSIS — E7800 Pure hypercholesterolemia, unspecified: Secondary | ICD-10-CM | POA: Diagnosis present

## 2020-11-23 DIAGNOSIS — F411 Generalized anxiety disorder: Secondary | ICD-10-CM | POA: Diagnosis present

## 2020-11-23 DIAGNOSIS — Z79899 Other long term (current) drug therapy: Secondary | ICD-10-CM | POA: Diagnosis not present

## 2020-11-23 DIAGNOSIS — Z7982 Long term (current) use of aspirin: Secondary | ICD-10-CM | POA: Diagnosis not present

## 2020-11-23 DIAGNOSIS — Z23 Encounter for immunization: Secondary | ICD-10-CM | POA: Diagnosis present

## 2020-11-23 DIAGNOSIS — K219 Gastro-esophageal reflux disease without esophagitis: Secondary | ICD-10-CM | POA: Diagnosis present

## 2020-11-23 DIAGNOSIS — Z833 Family history of diabetes mellitus: Secondary | ICD-10-CM | POA: Diagnosis not present

## 2020-11-23 DIAGNOSIS — Z8249 Family history of ischemic heart disease and other diseases of the circulatory system: Secondary | ICD-10-CM | POA: Diagnosis not present

## 2020-11-23 DIAGNOSIS — Z8673 Personal history of transient ischemic attack (TIA), and cerebral infarction without residual deficits: Secondary | ICD-10-CM | POA: Diagnosis not present

## 2020-11-23 DIAGNOSIS — Z91041 Radiographic dye allergy status: Secondary | ICD-10-CM | POA: Diagnosis not present

## 2020-11-23 LAB — CBC
HCT: 38.5 % (ref 36.0–46.0)
Hemoglobin: 12.7 g/dL (ref 12.0–15.0)
MCH: 30.5 pg (ref 26.0–34.0)
MCHC: 33 g/dL (ref 30.0–36.0)
MCV: 92.5 fL (ref 80.0–100.0)
Platelets: 226 10*3/uL (ref 150–400)
RBC: 4.16 MIL/uL (ref 3.87–5.11)
RDW: 12.6 % (ref 11.5–15.5)
WBC: 5.6 10*3/uL (ref 4.0–10.5)
nRBC: 0 % (ref 0.0–0.2)

## 2020-11-23 LAB — BASIC METABOLIC PANEL
Anion gap: 10 (ref 5–15)
BUN: 15 mg/dL (ref 8–23)
CO2: 26 mmol/L (ref 22–32)
Calcium: 8.3 mg/dL — ABNORMAL LOW (ref 8.9–10.3)
Chloride: 105 mmol/L (ref 98–111)
Creatinine, Ser: 0.7 mg/dL (ref 0.44–1.00)
GFR, Estimated: >60 mL/min (ref >60)
Glucose, Bld: 93 mg/dL (ref 70–99)
Potassium: 3.4 mmol/L — ABNORMAL LOW (ref 3.5–5.1)
Sodium: 141 mmol/L (ref 135–145)

## 2020-11-23 MED ORDER — BISACODYL 5 MG PO TBEC: 5.0000 mg | DELAYED_RELEASE_TABLET | Freq: Every day | ORAL | Status: DC | PRN

## 2020-11-23 MED ORDER — ACETAMINOPHEN 650 MG RE SUPP: 650.0000 mg | Freq: Four times a day (QID) | RECTAL | Status: DC | PRN

## 2020-11-23 MED ORDER — DILTIAZEM HCL 60 MG PO TABS: 30.0000 mg | ORAL_TABLET | Freq: Four times a day (QID) | ORAL | Status: DC | PRN

## 2020-11-23 MED ORDER — ASPIRIN EC 81 MG PO TBEC
81.0000 mg | DELAYED_RELEASE_TABLET | Freq: Every day | ORAL | Status: DC
  Administered 2020-11-24 – 2020-11-25 (×2): 81 mg via ORAL
  Filled 2020-11-23 (×2): qty 1

## 2020-11-23 MED ORDER — METRONIDAZOLE 500 MG/100ML IV SOLN
500.0000 mg | Freq: Two times a day (BID) | INTRAVENOUS | Status: DC
  Administered 2020-11-23 – 2020-11-25 (×5): 500 mg via INTRAVENOUS
  Filled 2020-11-23 (×5): qty 100

## 2020-11-23 MED ORDER — POLYETHYLENE GLYCOL 3350 17 G PO PACK: 17.0000 g | PACK | Freq: Every day | ORAL | Status: DC | PRN

## 2020-11-23 MED ORDER — ONDANSETRON HCL 4 MG PO TABS: 4.0000 mg | ORAL_TABLET | Freq: Four times a day (QID) | ORAL | Status: DC | PRN

## 2020-11-23 MED ORDER — DOCUSATE SODIUM 100 MG PO CAPS
100.0000 mg | ORAL_CAPSULE | Freq: Two times a day (BID) | ORAL | Status: DC
  Filled 2020-11-23 (×3): qty 1

## 2020-11-23 MED ORDER — ACETAMINOPHEN 325 MG PO TABS
650.0000 mg | ORAL_TABLET | Freq: Four times a day (QID) | ORAL | Status: DC | PRN
  Administered 2020-11-24: 650 mg via ORAL
  Filled 2020-11-23: qty 2

## 2020-11-23 MED ORDER — ESCITALOPRAM OXALATE 20 MG PO TABS
20.0000 mg | ORAL_TABLET | Freq: Every day | ORAL | Status: DC
  Administered 2020-11-23 – 2020-11-25 (×3): 20 mg via ORAL
  Filled 2020-11-23 (×2): qty 1
  Filled 2020-11-23: qty 2

## 2020-11-23 MED ORDER — ONDANSETRON HCL 4 MG/2ML IJ SOLN: 4.0000 mg | Freq: Four times a day (QID) | INTRAMUSCULAR | Status: DC | PRN

## 2020-11-23 MED ORDER — HYDRALAZINE HCL 20 MG/ML IJ SOLN: 5.0000 mg | INTRAMUSCULAR | Status: DC | PRN

## 2020-11-23 MED ORDER — SODIUM CHLORIDE 0.9 % IV SOLN
2.0000 g | INTRAVENOUS | Status: DC
  Administered 2020-11-23 – 2020-11-25 (×3): 2 g via INTRAVENOUS
  Filled 2020-11-23 (×3): qty 20

## 2020-11-23 MED ORDER — PRAVASTATIN SODIUM 10 MG PO TABS: 20.0000 mg | ORAL_TABLET | Freq: Every day | ORAL | Status: DC

## 2020-11-23 MED ORDER — ENOXAPARIN SODIUM 40 MG/0.4ML IJ SOSY
40.0000 mg | PREFILLED_SYRINGE | INTRAMUSCULAR | Status: DC
  Administered 2020-11-23 – 2020-11-24 (×2): 40 mg via SUBCUTANEOUS
  Filled 2020-11-23 (×2): qty 0.4

## 2020-11-23 MED ORDER — MORPHINE SULFATE (PF) 2 MG/ML IV SOLN: 2.0000 mg | INTRAVENOUS | Status: DC | PRN

## 2020-11-23 MED ORDER — PRAVASTATIN SODIUM 10 MG PO TABS
20.0000 mg | ORAL_TABLET | Freq: Every day | ORAL | Status: DC
  Administered 2020-11-23 – 2020-11-24 (×2): 20 mg via ORAL
  Filled 2020-11-23: qty 1
  Filled 2020-11-23: qty 2
  Filled 2020-11-23: qty 1
  Filled 2020-11-23: qty 2

## 2020-11-23 MED ORDER — HYDROCODONE-ACETAMINOPHEN 5-325 MG PO TABS: 1.0000 | ORAL_TABLET | ORAL | Status: DC | PRN

## 2020-11-23 NOTE — H&P (Signed)
History and Physical    [name] DOB: [birth year] DOA: 11/22/2020  PCP: Crist Infante, MD Consultants:  Laroy Apple - cardiology Patient coming from:  Home - lives alone; NOK: Niece and nephew in [place], [place], [place]  Chief Complaint: Abdominal pain  HPI: [name] is a 80 y.o. female with medical history significant of HTN; HLD; CVA; and hypothyroidism presenting with abdominal pain.  She reports that she did something "really stupid."  She had an acid reflux flare after eating a bunch of acidic fruits.  By Saturday a few weeks ago, she was having severe abdominal pain.  She went to see her PCP and diagnosed GERD.  She was started on Prilosec but it didn't help after a few days and so changed to Famotidine with improvement.  She did research and changed her diet and it seemed to be better.  Then her stomach started hurting again out of the blue and she was told to eat fiber.  It seemed to get better and then this past Saturday (10/1) it started again and she drank a big bottle of Miralax - took 7 doses at one time.  She had explosive diarrhea as a result.  She had an xray and was diagnosed with diverticulitis.  She currently had mild TTP in her abdomen, not having bowel issues or fever.  She reports that she has already ordered her full liquid dinner (she told me all the selections) and when she had periodic hunger pains at [facility] she was given peanut butter crackers with improvement.   ED Course: [facility] to [facility] transfer, per Dr. Hal Hope:  Abdominal pain is found to have acute sigmoid diverticulitis with perforation.  He has been consulted patient is kept n.p.o. and IV antibiotics.  COVID test is pending.  Review of Systems: As per HPI; otherwise review of systems reviewed and negative.   Ambulatory Status:  Ambulates without assistance  COVID Vaccine Status:  None; First shot; Complete  Past Medical History:   Diagnosis Date   Allergy    Anxiety    Back abscess    Carotid stenosis    R 1-39; L no stenosis >> rpt 12/2020 // carotid US 9/21: No significant ICA stenosis bilaterally   Cataract    just watching - bilateral   Diverticulitis    GERD (gastroesophageal reflux disease)    History of kidney stones    first stone now   History of syncope    Echocardiogram 9/21: EF 60-65, no RWMA, GR 1 DD, normal RVSF, RVSP 20.5   Hx of adenomatous colonic polyps    Hyperlipidemia    Hypertension    IBS (irritable bowel syndrome)    Lichen planus    Osteoporosis    PUD (peptic ulcer disease)    PVC (premature ventricular contraction)    Stroke (HCC)    Thyroid disease    TIA (transient ischemic attack) summer 2015    Past Surgical History:  Procedure Laterality Date   ABDOMINAL HYSTERECTOMY     complete   APPENDECTOMY     CARPAL TUNNEL RELEASE     left   COLONOSCOPY     polyps   CYSTOSCOPY WITH RETROGRADE PYELOGRAM, URETEROSCOPY AND STENT PLACEMENT Left 07/06/2015   Procedure: CYSTOSCOPY WITH LEFT RETROGRADE PYELOGRAM AND INTERPRETATION, LEFT URETEROSCOPY WITH DUAL LUMEN PYELOSCOPY, IDENTIFCATION AND DILATION OF URETERAL STRICTURE, JJ STENT REMOVAL, BASKET EXTRACTION OF RENAL PELVIS STONE, EXTRACTION OF ORGANIZED RENAL PELVIC BLOOD CLOT;  Surgeon: Pierre Bali  Gaynelle Arabian, MD;  Location: [facility];  Service: Urology;  Laterality: Left;  PLACEMENT OF LEFT DOUBLE J STEN   CYSTOSCOPY/RETROGRADE/URETEROSCOPY/STONE EXTRACTION WITH BASKET Left 05/08/2015   Procedure: CYSTOSCOPY/RETROGRADE/URETEROSCOPY/JJ STENT PLACEMENT;  Surgeon: Carolan Clines, MD;  Location: [facility];  Service: Urology;  Laterality: Left;   WRIST SURGERY     left    Social History   Socioeconomic History   Marital status: Widowed    Spouse name: Not on file   Number of children: Not on file   Years of education: Not on file   Highest education level: Not on file  Occupational History   Not on file  Tobacco Use   Smoking  status: Never   Smokeless tobacco: Never  Vaping Use   Vaping Use: Never used  Substance and Sexual Activity   Alcohol use: No   Drug use: No   Sexual activity: Not on file    Comment: Hysterectomy  Other Topics Concern   Not on file  Social History Narrative   Patient lives alone and has no children or relatives in [place] Determinants of Health   Financial Resource Strain: Not on file  Food Insecurity: Not on file  Transportation Needs: Not on file  Physical Activity: Not on file  Stress: Not on file  Social Connections: Not on file  Intimate Partner Violence: Not on file    Allergies  Allergen Reactions   Penicillins Swelling    By second day there was swelling of tongue and could not breathe Has patient had a PCN reaction causing immediate rash, facial/tongue/throat swelling, SOB or lightheadedness with hypotension: yes Has patient had a PCN reaction causing severe rash involving mucus membranes or skin necrosis: no Has patient had a PCN reaction that required hospitalization no Has patient had a PCN reaction occurring within the last 10 years:no If all of the above answers are "NO", then may proceed with Cephalosporin use   Sulfonamide Derivatives Hives and Rash    Where applied.   Ibandronic Acid     Other reaction(s): ? caused aches and pains.   Simvastatin Other (See Comments)    unknown   Iodinated Diagnostic Agents Other (See Comments)    Unknown allergy to unknown contrast during gb test many yrs ago//a.calhoun   Metronidazole Diarrhea and Other (See Comments)    Hyper, decreases appetite   Prednisone Other (See Comments)    Due to cataract on eye and history of PVC's    Family History  Problem Relation Age of Onset   Glaucoma Mother    Macular degeneration Mother    Stroke Mother    Hypertension Mother    Lung cancer Father    Heart attack Father    Cancer Father    Lung cancer Brother    Non-Hodgkin's lymphoma Brother    Diabetes  Brother    Atrial fibrillation Brother    Tuberculosis Paternal Grandfather    Colon cancer Paternal Grandmother 81   Cancer Paternal Grandmother    Heart attack Maternal Grandfather 72   Cancer Other    Breast cancer Neg Hx    Colon polyps Neg Hx    Esophageal cancer Neg Hx    Rectal cancer Neg Hx    Stomach cancer Neg Hx     Prior to Admission medications   Medication Sig Start Date End Date Taking? Authorizing Provider  acetaminophen (TYLENOL) 500 MG tablet Take 500-1,000 mg by mouth every 6 (six) hours as needed (pain).  [provider]  aspirin EC 81 MG tablet Take 1 tablet (81 mg total) by mouth daily. 12/18/12   Burnell Blanks, MD  Cyanocobalamin (VITAMIN B 12 PO) Take 5,000 mg by mouth daily.    [provider]  denosumab (PROLIA) 60 MG/ML SOSY injection Inject 60 mg into the skin every 6 (six) months.    [provider]  desoximetasone (TOPICORT) 0.25 % cream Apply 1 application topically 2 (two) times daily as needed (lichen planus on legs).     [provider]  dicyclomine (BENTYL) 10 MG capsule Take 1 capsule (10 mg total) by mouth 2 (two) times daily as needed for spasms. 11/18/20   Thornton Park, MD  diltiazem (CARDIZEM) 30 MG tablet TAKE 1 TABLET (30 MG TOTAL) BY MOUTH EVERY 6 (SIX) HOURS AS NEEDED (PALPITATIONS). 01/07/20   Burnell Blanks, MD  docusate sodium (STOOL SOFTENER) 100 MG capsule Take 1 capsule (100 mg total) by mouth 2 (two) times daily. 11/18/20   Thornton Park, MD  ENSURE PLUS (ENSURE PLUS) LIQD Take 350 mLs by mouth daily.     [provider]  ergocalciferol (VITAMIN D2) 50000 UNITS capsule Take 50,000 Units by mouth See admin instructions. Takes one capsule on weeks 1 through 3 of each month on Saturdays and then one capsule on week 4 on Mondays and Thursdays. (for a total of 5 capsules per month).    [provider]  escitalopram (LEXAPRO) 20 MG tablet Take 1 half tablet by  mouth and 10 mg tablet (15 mg total) daily    [provider]  esomeprazole (NEXIUM) 20 MG capsule Take 20 mg by mouth at bedtime.    [provider]  meclizine (ANTIVERT) 25 MG tablet Take 1 tablet (25 mg total) by mouth daily as needed for dizziness or nausea. 09/16/15   Burnell Blanks, MD  Multiple Vitamins-Minerals (CENTRUM SILVER 50+WOMEN) TABS Take 1 capsule by mouth daily.  Patient not taking: Reported on 11/10/2020 07/15/19   [provider]  polyethylene glycol (MIRALAX / GLYCOLAX) 17 g packet Dissolve 1 capful in 8 ounces of fluid and drink daily 11/18/20   Thornton Park, MD  pravastatin (PRAVACHOL) 20 MG tablet Take 20 mg by mouth at bedtime.    [provider]    Physical Exam: Vitals:   11/23/20 1039 11/23/20 1130 11/23/20 1215 11/23/20 1315  BP:      Pulse:      Resp:  14 15 19   Temp:      TempSrc:      SpO2:      Weight: 65.3 kg     Height: 5\' 3"  (1.6 m)        General:  Appears calm and comfortable and is in NAD Eyes:   EOMI, normal lids, iris ENT:  grossly normal hearing, lips & tongue, mmm Neck:  no LAD, masses or thyromegaly Cardiovascular:  RRR, no m/r/g. No LE edema.  Respiratory:   CTA bilaterally with no wheezes/rales/rhonchi.  Normal respiratory effort. Abdomen:  soft, NT, ND, NABS Skin:  no rash or induration seen on limited exam Musculoskeletal:  grossly normal tone BUE/BLE, good ROM, no bony abnormality Psychiatric:  grossly normal mood and affect, speech fluent and appropriate, AOx3, very conversant Neurologic:  CN 2-12 grossly intact, moves all extremities in coordinated fashion    Radiological Exams on Admission: Independently reviewed - see discussion in A/P where applicable  CT Abdomen Pelvis Wo Contrast  Result Date: 11/22/2020 CLINICAL DATA:  Acute abdominal pain. EXAM: CT ABDOMEN AND PELVIS WITHOUT CONTRAST TECHNIQUE: Multidetector CT imaging of the abdomen and pelvis was performed following the  standard protocol without IV contrast. COMPARISON:  CT renal stone 04/28/2015. FINDINGS: Lower chest: There is some linear scarring or atelectasis in the lung bases. Hepatobiliary: There is a rounded hypodensity in the dome of the liver which is too small to characterize and unchanged, possibly a cyst or hemangioma. The liver, gallbladder, and bile ducts are otherwise within normal limits. Pancreas: Unremarkable. No pancreatic ductal dilatation or surrounding inflammatory changes. Spleen: Normal in size without focal abnormality. Adrenals/Urinary Tract: Adrenal glands are unremarkable. Kidneys are normal, without renal calculi, focal lesion, or hydronephrosis. Bladder is unremarkable. Stomach/Bowel: There are diverticula within the distal ileum. There is a focal area of wall thickening with surrounding inflammation compatible with diverticulitis. There is small amount of free air compatible with perforation. No evidence for abscess. No bowel obstruction. There is colonic diverticulosis without evidence for acute diverticulitis. Appendix is not visualized stomach is within normal limits. Vascular/Lymphatic: Aortic atherosclerosis. No enlarged abdominal or pelvic lymph nodes. Reproductive: Status post hysterectomy. No adnexal masses. Other: No abdominal wall hernia or abnormality. No abdominopelvic ascites. Musculoskeletal: No acute or significant osseous findings. IMPRESSION: 1. Acute ileal diverticulitis with perforation. No evidence for abscess. 2. Colonic diverticulosis without evidence for acute diverticulitis. Electronically Signed   By: Ronney Asters M.D.   On: 11/22/2020 21:49    EKG: not done   Labs on Admission: I have personally reviewed the available labs and imaging studies at the time of the admission.  Pertinent labs:   K+ 3.4 Normal CBC COVID/flu negative UA: small Hgb, trace ketones, trace LE, 30 protein   Assessment/Plan Principal Problem:   Acute diverticulitis of intestine Active  Problems:   HYPERCHOLESTEROLEMIA   Anxiety state   Palpitations   Diverticulitis with perforation -Patient with acute onset of abdominal pain and explosive diarrhea, but this correlated with drinking an excessive amount of Miralax -She is already eating without difficulty, full liquids ordered -For now, will give pain medication with morphine, nausea medication with Zofran, and treat with Rocephin/Flagyl for intraabdominal infection -Surgery consult is pending -Admit to med surg -If doing well, maybe home as early as tomorrow  Palpitations -Takes prn diltiazem for palpitations, otherwise no meds  HLD -Continue Pravachol  Anxiety -Continue Lexapro  H/o TIA -Continue ASA     Note: This patient has been tested and is negative for the novel coronavirus COVID-19. The patient has been fully vaccinated against COVID-19.   Level of care: Med-Surg DVT prophylaxis:  Lovenox  Code Status:  Full - confirmed with patient Family Communication: None present Disposition Plan:  The patient is from: home  Anticipated d/c is to: home without [facility] services   Anticipated d/c date will depend on clinical response to treatment, but possibly as early as tomorrow if she has excellent response to treatment  Patient is currently: acutely ill Consults called: Surgery  Admission status:  Admit - It is my clinical opinion that admission to INPATIENT is reasonable and necessary because of the expectation that this patient will require hospital care that crosses at least 2 midnights to treat this condition based on the medical complexity of the problems presented.  Given the aforementioned information, the predictability of an adverse outcome is felt to be significant.    Karmen Bongo MD [facility]   How to contact the [facility] Attending or Consulting provider [place] or covering provider during  after hours 7P -7A, for this patient?  Check the care team in [facility] and look for a) attending/consulting  [facility] provider listed and b) the [facility] team listed Log into [email or web address] and use [place]'s universal password to access. If you do not have the password, please contact the hospital operator. Locate the [facility] provider you are looking for under [facility] and page to a number that you can be directly reached. If you still have difficulty reaching the provider, please page the [facility] (Director on Call) for the Hospitalists listed on amion for assistance.   11/23/2020, 5:02 PM

## 2020-11-24 LAB — CBC
HCT: 39.5 % (ref 36.0–46.0)
Hemoglobin: 13.1 g/dL (ref 12.0–15.0)
MCH: 31.2 pg (ref 26.0–34.0)
MCHC: 33.2 g/dL (ref 30.0–36.0)
MCV: 94 fL (ref 80.0–100.0)
Platelets: 234 10*3/uL (ref 150–400)
RBC: 4.2 MIL/uL (ref 3.87–5.11)
RDW: 12.5 % (ref 11.5–15.5)
WBC: 6.3 10*3/uL (ref 4.0–10.5)
nRBC: 0 % (ref 0.0–0.2)

## 2020-11-24 LAB — BASIC METABOLIC PANEL
Anion gap: 10 (ref 5–15)
BUN: 8 mg/dL (ref 8–23)
CO2: 27 mmol/L (ref 22–32)
Calcium: 7.9 mg/dL — ABNORMAL LOW (ref 8.9–10.3)
Chloride: 104 mmol/L (ref 98–111)
Creatinine, Ser: 0.73 mg/dL (ref 0.44–1.00)
GFR, Estimated: >60 mL/min (ref >60)
Glucose, Bld: 90 mg/dL (ref 70–99)
Potassium: 3.6 mmol/L (ref 3.5–5.1)
Sodium: 141 mmol/L (ref 135–145)

## 2020-11-24 MED ORDER — FAMOTIDINE 20 MG PO TABS: 20.0000 mg | ORAL_TABLET | Freq: Two times a day (BID) | ORAL | Status: DC | PRN

## 2020-11-24 MED ORDER — PANTOPRAZOLE SODIUM 40 MG PO TBEC
40.0000 mg | DELAYED_RELEASE_TABLET | Freq: Every day | ORAL | Status: DC
  Administered 2020-11-24 – 2020-11-25 (×2): 40 mg via ORAL
  Filled 2020-11-24 (×2): qty 1

## 2020-11-24 MED ORDER — DICYCLOMINE HCL 10 MG PO CAPS: 10.0000 mg | ORAL_CAPSULE | Freq: Two times a day (BID) | ORAL | Status: DC | PRN

## 2020-11-24 NOTE — Progress Notes (Signed)
[facility] Progress Note     Subjective: CC:  C/o headache and neck pain. Also reports increased, loose BMs. Denies nausea or emesis. Denies increased pain. Does endorse cramping abdominal discomfort with BMs, resolves after BM.  Objective: Vital signs in last 24 hours: Temp:  [97.8 F (36.6 C)-98.6 F (37 C)] 98.3 F (36.8 C) (10/04 0407) Pulse Rate:  [68-77] 77 (10/04 0407) Resp:  [14-19] 18 (10/04 0407) BP: (127-143)/(66-90) 129/67 (10/04 0407) SpO2:  [95 %-99 %] 95 % (10/04 0407) Weight:  [65.3 kg] 65.3 kg (10/03 1039) Last BM Date: 11/23/20  Intake/Output from previous day: 10/03 0701 - 10/04 0700 In: 1181 [P.O.:960; IV Piggyback:221] Out: -  Intake/Output this shift: No intake/output data recorded.  PE: Gen:  Alert, NAD, pleasant Card:  Regular rate and rhythm, pedal pulses 2+ BL Pulm:  Normal effort, clear to auscultation bilaterally Abd: Soft, focally tender in LLQ without guarding or rebound tenderness, mild distention, bowel sounds present in all 4 quadrants Skin: warm and dry, no rashes  Psych: A&Ox3   Lab Results:  Recent Labs    11/23/20 1038 11/24/20 0029  WBC 5.6 6.3  HGB 12.7 13.1  HCT 38.5 39.5  PLT 226 234   BMET Recent Labs    11/23/20 1038 11/24/20 0029  NA 141 141  K 3.4* 3.6  CL 105 104  CO2 26 27  GLUCOSE 93 90  BUN 15 8  CREATININE 0.70 0.73  CALCIUM 8.3* 7.9*   PT/INR No results for input(s): LABPROT, INR in the last 72 hours. CMP     Component Value Date/Time   NA 141 11/24/2020 0029   K 3.6 11/24/2020 0029   CL 104 11/24/2020 0029   CO2 27 11/24/2020 0029   GLUCOSE 90 11/24/2020 0029   BUN 8 11/24/2020 0029   CREATININE 0.73 11/24/2020 0029   CALCIUM 7.9 (L) 11/24/2020 0029   PROT 7.0 11/22/2020 2042   ALBUMIN 3.9 11/22/2020 2042   AST 11 (L) 11/22/2020 2042   ALT 9 11/22/2020 2042   ALKPHOS 48 11/22/2020 2042   BILITOT 0.4 11/22/2020 2042   GFRNONAA >60 11/24/2020 0029   GFRAA >60 08/03/2018 1040    Lipase     Component Value Date/Time   LIPASE 10 (L) 11/22/2020 2042       Studies/Results: CT Abdomen Pelvis Wo Contrast  Result Date: 11/22/2020 CLINICAL DATA:  Acute abdominal pain. EXAM: CT ABDOMEN AND PELVIS WITHOUT CONTRAST TECHNIQUE: Multidetector CT imaging of the abdomen and pelvis was performed following the standard protocol without IV contrast. COMPARISON:  CT renal stone 04/28/2015. FINDINGS: Lower chest: There is some linear scarring or atelectasis in the lung bases. Hepatobiliary: There is a rounded hypodensity in the dome of the liver which is too small to characterize and unchanged, possibly a cyst or hemangioma. The liver, gallbladder, and bile ducts are otherwise within normal limits. Pancreas: Unremarkable. No pancreatic ductal dilatation or surrounding inflammatory changes. Spleen: Normal in size without focal abnormality. Adrenals/Urinary Tract: Adrenal glands are unremarkable. Kidneys are normal, without renal calculi, focal lesion, or hydronephrosis. Bladder is unremarkable. Stomach/Bowel: There are diverticula within the distal ileum. There is a focal area of wall thickening with surrounding inflammation compatible with diverticulitis. There is small amount of free air compatible with perforation. No evidence for abscess. No bowel obstruction. There is colonic diverticulosis without evidence for acute diverticulitis. Appendix is not visualized stomach is within normal limits. Vascular/Lymphatic: Aortic atherosclerosis. No enlarged abdominal or pelvic lymph nodes. Reproductive: Status  post hysterectomy. No adnexal masses. Other: No abdominal wall hernia or abnormality. No abdominopelvic ascites. Musculoskeletal: No acute or significant osseous findings. IMPRESSION: 1. Acute ileal diverticulitis with perforation. No evidence for abscess. 2. Colonic diverticulosis without evidence for acute diverticulitis. Electronically Signed   By: Ronney Asters M.D.   On: 11/22/2020 21:49     Anti-infectives: Anti-infectives (From admission, onward)    Start     Dose/Rate Route Frequency Ordered Stop   11/23/20 1100  cefTRIAXone (ROCEPHIN) 2 g in sodium chloride 0.9 % 100 mL IVPB        2 g 200 mL/hr over 30 Minutes Intravenous Every 24 hours 11/23/20 1012     11/23/20 1030  metroNIDAZOLE (FLAGYL) IVPB 500 mg        500 mg 100 mL/hr over 60 Minutes Intravenous Every 12 hours 11/23/20 1012     11/22/20 2215  cefTRIAXone (ROCEPHIN) 1 g in sodium chloride 0.9 % 100 mL IVPB        1 g 200 mL/hr over 30 Minutes Intravenous  Once 11/22/20 2213 11/22/20 2326   11/22/20 2215  metroNIDAZOLE (FLAGYL) IVPB 500 mg        500 mg 100 mL/hr over 60 Minutes Intravenous  Once 11/22/20 2213 11/23/20 0253        Assessment/Plan  ileal diverticulitis with what appears to be a contained 'microperforation' - Afebrile, normal WBC, CT shows this to be relatively contained - I do not see any moderate/large volume pneumoperitoneum or any significant free fluid in the abdomen - denies increased pain, tolerating PO, having bowel function. advance to soft diet  - given diarrhea and ongoing mild pain, would monitor patient on SOFT diet for today, would be ok with discharge home tomorrow if sxs continue to improve. I think the diarrhea is likely a result of known colonic inflammation and liquid diet but should monitor to make sure she can remain hydrated. Low concern for c.dif given no leukocytosis, severe pain, or fever.  -  Would recommend continuation of PO abx for a total course of 14 days. Outpatient follow up with GI in 4 weeks to consider colonoscopy.   FEN: SOFT VTE: SCDs, ok to have LMWH or SQ heparin ID: rocephin/flagyl   HTN HLD GERD   LOS: 1 day    [name], [facility] Please see Amion for pager number during day hours 7:00am-4:30pm

## 2020-11-24 NOTE — Progress Notes (Signed)
PROGRESS NOTE   [name]  EXN:[id number] DOB: [birth year] DOA: 11/22/2020 PCP: Crist Infante, MD  Brief Narrative:  80 year old white female high functioning at baseline lives at home Prior appendicitis 2006, peptic ulcer disease, PVCs followed by cardiology status post event monitor showing sinus PVCs 10/2019, prior tubular adenoma 08/2018 IBS fibromyalgia  Has had prior episodes where she eats a lot of fruits and developed bloating constipation Did the same thing several days ago was diagnosed with reflux started on Prilosec/famotidine Took a lot of MiraLAX in addition Because of all her symptoms had an x-ray showed diverticulitis  Sent to urgent care-CT showed ileal diverticulitis with perforation no abscess White count 6 BUN/creatinine 8/0.7  Hospital-Problem based course  Diverticulitis small perforation Has not eaten solids today-about to have this for lunch Switch today to Augmentin to complete the 10-day course as recommended by surgery--if no fevers bloating pain or white count can probably discharge in a.m. 10/5 PVCs followed by cardiology Continue Cardizem 30 every 6 as needed, continue aspirin 81 IBS fibromyalgia Resume Pepcid 20 twice daily heartburn, Nexium 20 daily as needed On discharge should resume Imodium For now we will hold this and MiraLAX Resume Bentyl    DVT prophylaxis: Lovenox Code Status: Full Family Communication: None Disposition:  Status is: Inpatient  Remains inpatient appropriate because:Hemodynamically unstable and Unsafe d/c plan  Dispo: The patient is from: Home              Anticipated d/c is to: Home              Patient currently is not medically stable to d/c.   Difficult to place patient No       Consultants:  General surgery  Procedures: None  Antimicrobials:  Cipro Flagyl   Subjective: Doing fair 5diarrheal stools earlier  No recurrence About to have a solid meal no chest pain I have held her MiraLAX that  was ordered as as needed  Objective: Vitals:   11/23/20 2016 11/24/20 0006 11/24/20 0407 11/24/20 0801  BP: 135/90 (!) 142/76 129/67 131/68  Pulse: 70 68 77 69  Resp: 16 18 18 20   Temp: 97.8 F (36.6 C) 98.6 F (37 C) 98.3 F (36.8 C) 97.8 F (36.6 C)  TempSrc: Oral Oral Oral Oral  SpO2: 99% 97% 95% 97%  Weight:      Height:        Intake/Output Summary (Last 24 hours) at 11/24/2020 1303 Last data filed at 11/24/2020 1000 Gross per 24 hour  Intake 1080.99 ml  Output --  Net 1080.99 ml   Filed Weights   11/23/20 1039  Weight: 65.3 kg    Examination:  Awake coherent no distress EOMI NCAT CTA B no added sound No rales no rhonchi Abdomen soft no rebound no guarding ROM intact Power 5/5 moving all 4 limbs equally  Data Reviewed: personally reviewed   CBC    Component Value Date/Time   WBC 6.3 11/24/2020 0029   RBC 4.20 11/24/2020 0029   HGB 13.1 11/24/2020 0029   HCT 39.5 11/24/2020 0029   PLT 234 11/24/2020 0029   MCV 94.0 11/24/2020 0029   MCH 31.2 11/24/2020 0029   MCHC 33.2 11/24/2020 0029   RDW 12.5 11/24/2020 0029   LYMPHSABS 1.6 11/22/2020 2042   MONOABS 0.8 11/22/2020 2042   EOSABS 0.1 11/22/2020 2042   BASOSABS 0.1 11/22/2020 2042   CMP Latest Ref Rng & Units 11/24/2020 11/23/2020 11/22/2020  Glucose 70 - 99 mg/dL 90 93 97  BUN 8 - 23 mg/dL 8 15 18   Creatinine 0.44 - 1.00 mg/dL 0.73 0.70 0.88  Sodium 135 - 145 mmol/L 141 141 138  Potassium 3.5 - 5.1 mmol/L 3.6 3.4(L) 3.4(L)  Chloride 98 - 111 mmol/L 104 105 100  CO2 22 - 32 mmol/L 27 26 27   Calcium 8.9 - 10.3 mg/dL 7.9(L) 8.3(L) 9.0  Total Protein 6.5 - 8.1 g/dL - - 7.0  Total Bilirubin 0.3 - 1.2 mg/dL - - 0.4  Alkaline Phos 38 - 126 U/L - - 48  AST 15 - 41 U/L - - 11(L)  ALT 0 - 44 U/L - - 9     Radiology Studies: CT Abdomen Pelvis Wo Contrast  Result Date: 11/22/2020 CLINICAL DATA:  Acute abdominal pain. EXAM: CT ABDOMEN AND PELVIS WITHOUT CONTRAST TECHNIQUE: Multidetector CT imaging of  the abdomen and pelvis was performed following the standard protocol without IV contrast. COMPARISON:  CT renal stone 04/28/2015. FINDINGS: Lower chest: There is some linear scarring or atelectasis in the lung bases. Hepatobiliary: There is a rounded hypodensity in the dome of the liver which is too small to characterize and unchanged, possibly a cyst or hemangioma. The liver, gallbladder, and bile ducts are otherwise within normal limits. Pancreas: Unremarkable. No pancreatic ductal dilatation or surrounding inflammatory changes. Spleen: Normal in size without focal abnormality. Adrenals/Urinary Tract: Adrenal glands are unremarkable. Kidneys are normal, without renal calculi, focal lesion, or hydronephrosis. Bladder is unremarkable. Stomach/Bowel: There are diverticula within the distal ileum. There is a focal area of wall thickening with surrounding inflammation compatible with diverticulitis. There is small amount of free air compatible with perforation. No evidence for abscess. No bowel obstruction. There is colonic diverticulosis without evidence for acute diverticulitis. Appendix is not visualized stomach is within normal limits. Vascular/Lymphatic: Aortic atherosclerosis. No enlarged abdominal or pelvic lymph nodes. Reproductive: Status post hysterectomy. No adnexal masses. Other: No abdominal wall hernia or abnormality. No abdominopelvic ascites. Musculoskeletal: No acute or significant osseous findings. IMPRESSION: 1. Acute ileal diverticulitis with perforation. No evidence for abscess. 2. Colonic diverticulosis without evidence for acute diverticulitis. Electronically Signed   By: Ronney Asters M.D.   On: 11/22/2020 21:49     Scheduled Meds:  aspirin EC  81 mg Oral Daily   docusate sodium  100 mg Oral BID   enoxaparin (LOVENOX) injection  40 mg Subcutaneous Q24H   escitalopram  20 mg Oral Daily   pravastatin  20 mg Oral QHS   Continuous Infusions:  cefTRIAXone (ROCEPHIN)  IV 2 g (11/24/20 1116)    metronidazole 500 mg (11/24/20 1005)     LOS: 1 day   Time spent: Morning Glory, MD [facility] To contact the attending provider between 7A-7P or the covering provider during after hours 7P-7A, please log into the web site [email or web address] and access using universal [place] password for that web site. If you do not have the password, please call the hospital operator.  11/24/2020, 1:03 PM

## 2020-11-25 MED ORDER — INFLUENZA VAC A&B SA ADJ QUAD 0.5 ML IM PRSY
0.5000 mL | PREFILLED_SYRINGE | Freq: Once | INTRAMUSCULAR | Status: AC
  Administered 2020-11-25: 0.5 mL via INTRAMUSCULAR
  Filled 2020-11-25: qty 0.5

## 2020-11-25 MED ORDER — INFLUENZA VAC A&B SA ADJ QUAD 0.5 ML IM PRSY: 0.5000 mL | PREFILLED_SYRINGE | INTRAMUSCULAR | Status: DC

## 2020-11-25 MED ORDER — METRONIDAZOLE 500 MG PO TABS: 500.0000 mg | ORAL_TABLET | Freq: Two times a day (BID) | ORAL | 0 refills | Status: AC

## 2020-11-25 MED ORDER — CEFPODOXIME PROXETIL 200 MG PO TABS: 200.0000 mg | ORAL_TABLET | Freq: Two times a day (BID) | ORAL | 0 refills | Status: AC

## 2020-11-25 NOTE — Progress Notes (Signed)
Patient discharged to home with instructions and answered questions.

## 2020-11-25 NOTE — Discharge Instructions (Signed)
Low residue diet Many people on a low fiber diet may also receive recommendations to follow a "low residue diet." This means no foods containing particles such as nuts or seeds (residues) which are difficult to break down in the gut.  The low residue diet will add extra dietary restrictions. Especially since any and all foods containing nuts or seeds are not allowed.  Who is a low fiber diet for? The purpose of a low fiber diet is to reduce the amount of bulk and output of stool. Thereby helping to give the bowels a rest. And ultimately to minimize the likelihood of experiencing unwanted digestive symptoms.  A low fiber diet and/or low residue foods are often recommended by doctors and registered dietitians for people to better manage irritable bowel disease (IBD) and certain types of other digestive conditions, especially during a flare.   Examples of conditions that may benefit from a low fiber diet include, but are not limited to:  Diverticulitis Crohn's disease Ulcerative colitis Short bowel syndrome Ileostomy Bowel obstruction ("Intestinal obstruction") Twisted colon ("Volvulus") Recent bowel surgery   Low Fiber Grains and Starches:  Strained oatmeal Cream of rice Cream of wheat Rice pasta Corn pasta Cream of corn Puffed rice cereal Grits Corn flakes Plain rice crackers Saltines Ritz style crackers White jasmine rice White basmati rice Peeled baked/mashed potatoes (regular or sweet) [place] fries made from potatoes without skins Peeled cooked squash (butternut, winter, spaghetti) Cassava flour pancakes Butternut squash pancakes Sweet potato pancakes Regular pancakes or waffles Gluten free pancakes or waffles Cassava flour tortillas Rice cakes    Low Fiber Vegetables:  Asparagus tips, canned or cooked Artichoke hearts, canned/jarred or cooked Baby green (spinach/kale), cooked Beets, peeled and well cooked Broccoli; well boiled, pureed in soups- as  tolerated Butternut squash or winter squash, cooked, without skin or seeds   Peeled & baked or boiled or mashed potatoes (regular or sweet), potatoes Cabbage, well boiled - in moderation, as tolerated Carrots, cooked & peeled Cauliflower mash or in cauliflower pizza crust; only as tolerated Cooked/canned peppers with skins completely removed Celery, cooked & peeled Green beans/wax beans, canned or cooked Green peas, cooked Onions, well boiled in soups, or sauteed until translucent- in moderation, as tolerated Mushrooms, canned or cooked Pumpkin, cooked or canned Iceberg lettuce, in moderation, as tolerated Butter lettuce, in moderation, as tolerated Green juice, strained, on an empty stomach 100% veggie juice, fresh or pasteurized (V8) Strained peeled tomatoes, jarred or canned Well-cooked spiralized zucchini noodles Seedless spaghetti squash, baked/boiled Baked peeled mashed turnips      Low Fiber Fruits: 100% fruit juice, (except prune juice), strained; fresh or pasteurized -without any pulp Canned fruit without seeds or skin, packed in 100% fruit juice Cooked & peeled fruit without seeds or skin (baked apples, blueberries in homemade muffins/ jam/ blueberry pancakes, baked pears/peaches; fried or baked ripe plantains) Applesauce, unsweetened Apricots, ripe and peeled Bananas, ripe Cantaloupe, ripe Fresh ripe peeled nectarines Honeydew melon, ripe Peaches - peeled, ripe/frozen Avocado, ripe enough, up to 1/4 Pineapple, fresh ripe Fresh ripe peeled plums Fresh ripe peeled mango Papaya, ripe, no seeds Watermelon, seedless Jams/jellies without seeds

## 2020-11-25 NOTE — Progress Notes (Signed)
[facility] Progress Note     Subjective: CC:  Denies pain. Diarrhea has resolved. Tolerated SOFT diet. Dressed for discharge.  Objective: Vital signs in last 24 hours: Temp:  [97.7 F (36.5 C)-98.2 F (36.8 C)] 98 F (36.7 C) (10/05 0742) Pulse Rate:  [58-64] 58 (10/05 0742) Resp:  [16-20] 20 (10/05 0742) BP: (111-132)/(63-69) 111/68 (10/05 0742) SpO2:  [98 %-100 %] 100 % (10/05 0742) Last BM Date: 11/24/20  Intake/Output from previous day: 10/04 0701 - 10/05 0700 In: 480 [P.O.:180; IV Piggyback:300] Out: -  Intake/Output this shift: Total I/O In: 240 [P.O.:240] Out: -   PE: Gen:  Alert, NAD, pleasant Card:  Regular rate and rhythm, pedal pulses 2+ BL Pulm:  Normal effort, clear to auscultation bilaterally Abd: Soft, nontender, +BS Skin: warm and dry, no rashes  Psych: A&Ox3   Lab Results:  Recent Labs    11/23/20 1038 11/24/20 0029  WBC 5.6 6.3  HGB 12.7 13.1  HCT 38.5 39.5  PLT 226 234   BMET Recent Labs    11/23/20 1038 11/24/20 0029  NA 141 141  K 3.4* 3.6  CL 105 104  CO2 26 27  GLUCOSE 93 90  BUN 15 8  CREATININE 0.70 0.73  CALCIUM 8.3* 7.9*   PT/INR No results for input(s): LABPROT, INR in the last 72 hours. CMP     Component Value Date/Time   NA 141 11/24/2020 0029   K 3.6 11/24/2020 0029   CL 104 11/24/2020 0029   CO2 27 11/24/2020 0029   GLUCOSE 90 11/24/2020 0029   BUN 8 11/24/2020 0029   CREATININE 0.73 11/24/2020 0029   CALCIUM 7.9 (L) 11/24/2020 0029   PROT 7.0 11/22/2020 2042   ALBUMIN 3.9 11/22/2020 2042   AST 11 (L) 11/22/2020 2042   ALT 9 11/22/2020 2042   ALKPHOS 48 11/22/2020 2042   BILITOT 0.4 11/22/2020 2042   GFRNONAA >60 11/24/2020 0029   GFRAA >60 08/03/2018 1040   Lipase     Component Value Date/Time   LIPASE 10 (L) 11/22/2020 2042       Studies/Results: No results found.  Anti-infectives: Anti-infectives (From admission, onward)    Start     Dose/Rate Route Frequency Ordered Stop    11/23/20 1100  cefTRIAXone (ROCEPHIN) 2 g in sodium chloride 0.9 % 100 mL IVPB        2 g 200 mL/hr over 30 Minutes Intravenous Every 24 hours 11/23/20 1012     11/23/20 1030  metroNIDAZOLE (FLAGYL) IVPB 500 mg        500 mg 100 mL/hr over 60 Minutes Intravenous Every 12 hours 11/23/20 1012     11/22/20 2215  cefTRIAXone (ROCEPHIN) 1 g in sodium chloride 0.9 % 100 mL IVPB        1 g 200 mL/hr over 30 Minutes Intravenous  Once 11/22/20 2213 11/22/20 2326   11/22/20 2215  metroNIDAZOLE (FLAGYL) IVPB 500 mg        500 mg 100 mL/hr over 60 Minutes Intravenous  Once 11/22/20 2213 11/23/20 0253        Assessment/Plan  ileal diverticulitis with what appears to be a contained 'microperforation' - Afebrile, normal WBC, CT shows this to be relatively contained - I do not see any moderate/large volume pneumoperitoneum or any significant free fluid in the abdomen - denies pain, tolerating PO, having bowel function -  Would recommend continuation of PO abx for a total course of 14 days. Outpatient follow up with GI  in 4 weeks. Stable for discharge from general surgery standpoint   FEN: SOFT VTE: SCDs, ok to have LMWH or SQ heparin ID: rocephin/flagyl   HTN HLD GERD   LOS: 2 days    Obie Dredge, [facility] Please see Amion for pager number during day hours 7:00am-4:30pm

## 2020-11-28 NOTE — Discharge Summary (Signed)
Physician Discharge Summary   Patient name: [name]  Admit date:     11/22/2020  Discharge date: 11/28/2020  Attending Physician: Rise Patience [[id number]]  Discharge Physician: Berle Mull   PCP: Crist Infante, MD    Follow-up Information     Crist Infante, MD. Schedule an appointment as soon as possible for a visit in 1 week(s).   Specialty: Internal Medicine Contact information: [address] [place] [address] [phone]                Recommendations at discharge: Follow-up with GI.  Follow-up with PCP.  Discharge Diagnoses Principal Problem:   Acute diverticulitis of intestine Active Problems:   HYPERCHOLESTEROLEMIA   Anxiety state   Palpitations   Resolved Diagnoses Resolved Problems:   * No resolved hospital problems. [facility] Course   ileal diverticulitis with what appears to be a contained 'microperforation' Has not eaten solids today-about to have this for lunch Switch to Augmentin to complete the 10-day course as recommended by surgery  PVCs followed by cardiology Continue Cardizem 30 every 6 as needed, continue aspirin 81  IBS fibromyalgia Resume Pepcid 20 twice daily heartburn, Nexium 20 daily as needed On discharge should resume Imodium For now we will hold this and MiraLAX Resume Bentyl   Procedures performed: none   Condition at discharge: good  Exam General: Appear in mild distress, no Rash; Oral Mucosa Clear, moist. no Abnormal Neck Mass Or lumps, Conjunctiva normal  Cardiovascular: S1 and S2 Present, no Murmur, Respiratory: good respiratory effort, Bilateral Air entry present and CTA, no Crackles, no wheezes Abdomen: Bowel Sound present, Soft and no tenderness Extremities: no Pedal edema Neurology: alert and oriented to time, place, and person affect appropriate. no new focal deficit Gait not checked due to patient safety concerns   Disposition: Home  Discharge time: greater than 30  minutes. Allergies as of 11/25/2020       Reactions   Penicillins Swelling   By second day there was swelling of tongue and could not breathe Has patient had a PCN reaction causing immediate rash, facial/tongue/throat swelling, SOB or lightheadedness with hypotension: yes Has patient had a PCN reaction causing severe rash involving mucus membranes or skin necrosis: no Has patient had a PCN reaction that required hospitalization no Has patient had a PCN reaction occurring within the last 10 years:no If all of the above answers are "NO", then may proceed with Cephalosporin use   Sulfonamide Derivatives Hives, Rash   Where applied.   Ibandronic Acid    Other reaction(s): ? caused aches and pains.   Simvastatin Other (See Comments)   unknown   Iodinated Diagnostic Agents Other (See Comments)   Unknown allergy to unknown contrast during gb test many yrs ago//a.calhoun   Metronidazole Diarrhea, Other (See Comments)   Hyper, decreases appetite   Prednisone Other (See Comments)   Due to cataract on eye and history of PVC's        Medication List     TAKE these medications    acetaminophen 500 MG tablet Commonly known as: TYLENOL Take 500-1,000 mg by mouth every 6 (six) hours as needed for moderate pain.   aspirin EC 81 MG tablet Take 1 tablet (81 mg total) by mouth daily.   bifidobacterium infantis capsule Take 1 capsule by mouth daily.   cefpodoxime 200 MG tablet Commonly known as: VANTIN Take 1 tablet (200 mg total) by mouth 2 (two) times daily for 10 days.  Centrum Silver 50+Women Tabs Take 1 capsule by mouth daily.   denosumab 60 MG/ML Sosy injection Commonly known as: PROLIA Inject 60 mg into the skin every 6 (six) months.   desoximetasone 0.25 % cream Commonly known as: TOPICORT Apply 1 application topically 2 (two) times daily as needed (lichen planus on legs).   dicyclomine 10 MG capsule Commonly known as: BENTYL Take 1 capsule (10 mg total) by mouth 2 (two)  times daily as needed for spasms.   diltiazem 30 MG tablet Commonly known as: CARDIZEM TAKE 1 TABLET (30 MG TOTAL) BY MOUTH EVERY 6 (SIX) HOURS AS NEEDED (PALPITATIONS).   docusate sodium 100 MG capsule Commonly known as: Stool Softener Take 1 capsule (100 mg total) by mouth 2 (two) times daily. What changed:  when to take this reasons to take this   Ensure Plus Liqd Take 350 mLs by mouth daily.   ergocalciferol 1.25 MG (50000 UT) capsule Commonly known as: VITAMIN D2 Take 50,000 Units by mouth See admin instructions. Takes one capsule on weeks 1 through 3 of each month on Saturdays and then one capsule on week 4 on Mondays and Thursdays. (for a total of 5 capsules per month).   escitalopram 20 MG tablet Commonly known as: LEXAPRO Take 15 mg by mouth daily.   esomeprazole 20 MG capsule Commonly known as: NEXIUM Take 20 mg by mouth daily as needed (heartburn).   famotidine 20 MG tablet Commonly known as: PEPCID Take 20 mg by mouth 2 (two) times daily as needed for heartburn.   fluticasone 50 MCG/ACT nasal spray Commonly known as: FLONASE Place 1 spray into both nostrils daily as needed for allergies.   loperamide 2 MG tablet Commonly known as: IMODIUM A-D Take 2 mg by mouth 4 (four) times daily as needed for diarrhea or loose stools.   meclizine 25 MG tablet Commonly known as: ANTIVERT Take 1 tablet (25 mg total) by mouth daily as needed for dizziness or nausea.   metroNIDAZOLE 500 MG tablet Commonly known as: Flagyl Take 1 tablet (500 mg total) by mouth 2 (two) times daily for 10 days.   Mylanta Maximum Strength 400-400-40 MG/5ML suspension Generic drug: alum & mag hydroxide-simeth Take 10 mLs by mouth every 6 (six) hours as needed for indigestion.   Pepto-Bismol 262 MG/15ML suspension Generic drug: bismuth subsalicylate Take 30 mLs by mouth daily as needed for indigestion.   polyethylene glycol 17 g packet Commonly known as: MIRALAX / GLYCOLAX Dissolve 1  capful in 8 ounces of fluid and drink daily What changed:  how much to take how to take this when to take this reasons to take this   pravastatin 20 MG tablet Commonly known as: PRAVACHOL Take 20 mg by mouth at bedtime.   VITAMIN B 12 PO Take 5,000 mcg by mouth daily.        CT Abdomen Pelvis Wo Contrast  Result Date: 11/22/2020 CLINICAL DATA:  Acute abdominal pain. EXAM: CT ABDOMEN AND PELVIS WITHOUT CONTRAST TECHNIQUE: Multidetector CT imaging of the abdomen and pelvis was performed following the standard protocol without IV contrast. COMPARISON:  CT renal stone 04/28/2015. FINDINGS: Lower chest: There is some linear scarring or atelectasis in the lung bases. Hepatobiliary: There is a rounded hypodensity in the dome of the liver which is too small to characterize and unchanged, possibly a cyst or hemangioma. The liver, gallbladder, and bile ducts are otherwise within normal limits. Pancreas: Unremarkable. No pancreatic ductal dilatation or surrounding inflammatory changes. Spleen: Normal in size  without focal abnormality. Adrenals/Urinary Tract: Adrenal glands are unremarkable. Kidneys are normal, without renal calculi, focal lesion, or hydronephrosis. Bladder is unremarkable. Stomach/Bowel: There are diverticula within the distal ileum. There is a focal area of wall thickening with surrounding inflammation compatible with diverticulitis. There is small amount of free air compatible with perforation. No evidence for abscess. No bowel obstruction. There is colonic diverticulosis without evidence for acute diverticulitis. Appendix is not visualized stomach is within normal limits. Vascular/Lymphatic: Aortic atherosclerosis. No enlarged abdominal or pelvic lymph nodes. Reproductive: Status post hysterectomy. No adnexal masses. Other: No abdominal wall hernia or abnormality. No abdominopelvic ascites. Musculoskeletal: No acute or significant osseous findings. IMPRESSION: 1. Acute ileal  diverticulitis with perforation. No evidence for abscess. 2. Colonic diverticulosis without evidence for acute diverticulitis. Electronically Signed   By: Ronney Asters M.D.   On: 11/22/2020 21:49   Results for orders placed or performed during the hospital encounter of 11/22/20  Resp Panel by RT-PCR (Flu A&B, Covid) Nasopharyngeal Swab     Status: None   Collection Time: 11/22/20 10:36 PM   Specimen: Nasopharyngeal Swab; Nasopharyngeal(NP) swabs in vial transport medium  Result Value Ref Range Status   SARS Coronavirus 2 by RT PCR NEGATIVE NEGATIVE Final    Comment: (NOTE) SARS-CoV-2 target nucleic acids are NOT DETECTED.  The SARS-CoV-2 RNA is generally detectable in upper respiratory specimens during the acute phase of infection. The lowest concentration of SARS-CoV-2 viral copies this assay can detect is 138 copies/mL. A negative result does not preclude SARS-Cov-2 infection and should not be used as the sole basis for treatment or other patient management decisions. A negative result may occur with  improper specimen collection/handling, submission of specimen other than nasopharyngeal swab, presence of viral mutation(s) within the areas targeted by this assay, and inadequate number of viral copies(<138 copies/mL). A negative result must be combined with clinical observations, patient history, and epidemiological information. The expected result is Negative.  Fact Sheet for Patients:  EntrepreneurPulse.com.au  Fact Sheet for Healthcare Providers:  IncredibleEmployment.be  This test is no t yet approved or cleared by the [place] FDA and  has been authorized for detection and/or diagnosis of SARS-CoV-2 by FDA under an Emergency Use Authorization (EUA). This EUA will remain  in effect (meaning this test can be used) for the duration of the COVID-19 declaration under Section 564(b)(1) of the Act, 21 U.S.C.section 360bbb-3(b)(1), unless the  authorization is terminated  or revoked sooner.       Influenza A by PCR NEGATIVE NEGATIVE Final   Influenza B by PCR NEGATIVE NEGATIVE Final    Comment: (NOTE) The Xpert Xpress SARS-CoV-2/FLU/RSV plus assay is intended as an aid in the diagnosis of influenza from Nasopharyngeal swab specimens and should not be used as a sole basis for treatment. Nasal washings and aspirates are unacceptable for Xpert Xpress SARS-CoV-2/FLU/RSV testing.  Fact Sheet for Patients: EntrepreneurPulse.com.au  Fact Sheet for Healthcare Providers: IncredibleEmployment.be  This test is not yet approved or cleared by the [place] FDA and has been authorized for detection and/or diagnosis of SARS-CoV-2 by FDA under an Emergency Use Authorization (EUA). This EUA will remain in effect (meaning this test can be used) for the duration of the COVID-19 declaration under Section 564(b)(1) of the Act, 21 U.S.C. section 360bbb-3(b)(1), unless the authorization is terminated or revoked.  Performed at [facility], [address] [place], [place] [address]     Signed:  Berle Mull MD.  [facility] 11/28/2020, 8:14 AM

## 2020-11-30 DIAGNOSIS — K219 Gastro-esophageal reflux disease without esophagitis: Secondary | ICD-10-CM | POA: Diagnosis not present

## 2020-11-30 DIAGNOSIS — I10 Essential (primary) hypertension: Secondary | ICD-10-CM | POA: Diagnosis not present

## 2020-11-30 DIAGNOSIS — E785 Hyperlipidemia, unspecified: Secondary | ICD-10-CM | POA: Diagnosis not present

## 2020-11-30 DIAGNOSIS — K5712 Diverticulitis of small intestine without perforation or abscess without bleeding: Secondary | ICD-10-CM | POA: Diagnosis not present

## 2020-11-30 DIAGNOSIS — J302 Other seasonal allergic rhinitis: Secondary | ICD-10-CM | POA: Diagnosis not present

## 2020-12-15 DIAGNOSIS — R7301 Impaired fasting glucose: Secondary | ICD-10-CM | POA: Diagnosis not present

## 2020-12-15 DIAGNOSIS — E559 Vitamin D deficiency, unspecified: Secondary | ICD-10-CM | POA: Diagnosis not present

## 2020-12-15 DIAGNOSIS — E785 Hyperlipidemia, unspecified: Secondary | ICD-10-CM | POA: Diagnosis not present

## 2020-12-15 DIAGNOSIS — R946 Abnormal results of thyroid function studies: Secondary | ICD-10-CM | POA: Diagnosis not present

## 2020-12-18 ENCOUNTER — Other Ambulatory Visit (INDEPENDENT_AMBULATORY_CARE_PROVIDER_SITE_OTHER): Payer: Medicare PPO

## 2020-12-18 ENCOUNTER — Ambulatory Visit (INDEPENDENT_AMBULATORY_CARE_PROVIDER_SITE_OTHER): Payer: Medicare PPO | Admitting: Gastroenterology

## 2020-12-18 ENCOUNTER — Encounter: Payer: Self-pay | Admitting: Gastroenterology

## 2020-12-18 VITALS — BP 110/60 | HR 86 | Ht 63.0 in | Wt 140.5 lb

## 2020-12-18 DIAGNOSIS — K5700 Diverticulitis of small intestine with perforation and abscess without bleeding: Secondary | ICD-10-CM | POA: Diagnosis not present

## 2020-12-18 DIAGNOSIS — R194 Change in bowel habit: Secondary | ICD-10-CM

## 2020-12-18 DIAGNOSIS — R1030 Lower abdominal pain, unspecified: Secondary | ICD-10-CM

## 2020-12-18 LAB — COMPREHENSIVE METABOLIC PANEL
ALT: 17 U/L (ref 0–35)
AST: 17 U/L (ref 0–37)
Albumin: 4.2 g/dL (ref 3.5–5.2)
Alkaline Phosphatase: 39 U/L (ref 39–117)
BUN: 15 mg/dL (ref 6–23)
CO2: 28 mEq/L (ref 19–32)
Calcium: 8.6 mg/dL (ref 8.4–10.5)
Chloride: 104 mEq/L (ref 96–112)
Creatinine, Ser: 0.82 mg/dL (ref 0.40–1.20)
GFR: 67.12 mL/min (ref >60.00)
Glucose, Bld: 93 mg/dL (ref 70–99)
Potassium: 3.7 mEq/L (ref 3.5–5.1)
Sodium: 141 mEq/L (ref 135–145)
Total Bilirubin: 0.5 mg/dL (ref 0.2–1.2)
Total Protein: 7.1 g/dL (ref 6.0–8.3)

## 2020-12-18 LAB — CBC WITH DIFFERENTIAL/PLATELET
Basophils Absolute: 0 10*3/uL (ref 0.0–0.1)
Basophils Relative: 0.6 % (ref 0.0–3.0)
Eosinophils Absolute: 0.1 10*3/uL (ref 0.0–0.7)
Eosinophils Relative: 2.3 % (ref 0.0–5.0)
HCT: 42.7 % (ref 36.0–46.0)
Hemoglobin: 14 g/dL (ref 12.0–15.0)
Lymphocytes Relative: 33.1 % (ref 12.0–46.0)
Lymphs Abs: 1.7 10*3/uL (ref 0.7–4.0)
MCHC: 32.7 g/dL (ref 30.0–36.0)
MCV: 93.4 fl (ref 78.0–100.0)
Monocytes Absolute: 0.5 10*3/uL (ref 0.1–1.0)
Monocytes Relative: 9.7 % (ref 3.0–12.0)
Neutro Abs: 2.9 10*3/uL (ref 1.4–7.7)
Neutrophils Relative %: 54.3 % (ref 43.0–77.0)
Platelets: 200 10*3/uL (ref 150.0–400.0)
RBC: 4.57 Mil/uL (ref 3.87–5.11)
RDW: 14.2 % (ref 11.5–15.5)
WBC: 5.3 10*3/uL (ref 4.0–10.5)

## 2020-12-18 MED ORDER — RIFAXIMIN 550 MG PO TABS: 550.0000 mg | ORAL_TABLET | Freq: Three times a day (TID) | ORAL | 0 refills | Status: DC

## 2020-12-18 NOTE — Patient Instructions (Addendum)
It was a pleasure to see you today. I am sorry that you aren't feeling better after your recent hospitalization.   I have recommended a CT scan to follow-up on your diverticulitis given your ongoing pain.   Please stop by the lab for testing of your white count and your kidneys in preparation for your CT scan.   Continue to take your Align every day.   I have recommended a trial of Xifaxan, a gut specific antibiotic, to help reset the bacteria in your intestines after your recent diverticulitis.   You may continue to use PeptoBismal as you need to. But, it will make your stools black and change the texture.   Follow a high fiber diet or use fiber supplements on a regular basis. I recommend that you Benefiber or Metamucil every day.   There is no need to avoid seeds, nuts, corn, and berries. You may have cranberries at Thanksgiving.  If you find any specific foods worsen your symptoms, please avoid those foods. But, there is no data that tells [place] that you need to avoid foods with seeds, nuts, corn, or berries at this time.   Non-steroidal antiinflammatory medications (such as ibuprofen, naproxen, Aleve, BC powder, and Goody's etc) may increase your risk for a recurrent of diverticulitis. Please avoid these medications whenever possible.    I'd like to see you back in the office in 3-4 weeks. However, please let me know if you need anything prior to that time.   You have been scheduled for a CT scan of the abdomen and pelvis at [facility], 1st floor Radiology. You are scheduled on 12/24/2020  at 8:30pm. You should arrive 15 minutes prior to your appointment time for registration.  Please pick up 2 bottles of contrast from [facility] at least 3 days prior to your scan. The solution may taste better if refrigerated, but do NOT add ice or any other liquid to this solution. Shake well before drinking.   Please follow the written instructions below on the day of your exam:   1) Do not eat  anything after 4:30am (4 hours prior to your test)   2) Drink 1 bottle of contrast @ 6:30am (2 hours prior to your exam)  Remember to shake well before drinking and do NOT pour over ice.     Drink 1 bottle of contrast @ 7:30am (1 hour prior to your exam)   You may take any medications as prescribed with a small amount of water, if necessary. If you take any of the following medications: METFORMIN, GLUCOPHAGE, GLUCOVANCE, AVANDAMET, RIOMET, FORTAMET, Elliott MET, JANUMET, GLUMETZA or METAGLIP, you MAY be asked to HOLD this medication 48 hours AFTER the exam.   The purpose of you drinking the oral contrast is to aid in the visualization of your intestinal tract. The contrast solution may cause some diarrhea. Depending on your individual set of symptoms, you may also receive an intravenous injection of x-ray contrast/dye. Plan on being at [facility] for 45 minutes or longer, depending on the type of exam you are having performed.   If you have any questions regarding your exam or if you need to reschedule, you may call [facility] at [phone] between the hours of 8:00 am and 5:00 pm, Monday-Friday.   Due to recent changes in healthcare laws, you may see the results of your imaging and laboratory studies on MyChart before your provider has had a chance to review them.  We understand that in  some cases there may be results that are confusing or concerning to you. Not all laboratory results come back in the same time frame and the provider may be waiting for multiple results in order to interpret others.  Please give [place] 48 hours in order for your provider to thoroughly review all the results before contacting the office for clarification of your results.

## 2020-12-18 NOTE — Progress Notes (Signed)
Referring Provider: Crist Infante, MD Primary Care Physician:  Crist Infante, MD  Chief complaint:  Recent hospitalization for diverticulitis   IMPRESSION:  Altered bowel habits after recent hospitalization for acute ileal diverticulitis with microperforation. I have personally reviewed the images from her non-contrast CT abd/pelvis from 11/22/20. Symptoms may be related to complicated of incompletely diverticulitis but must also consider SIBO and post-infectious IBS.  Anxiety was also be contributing to her symptoms  Reviewed recommendations to follow a high fiber diet or to use fiber supplements on a regular basis.  However, there is no need to avoid seeds, corn, berries, and nuts. Minimizing red meat in the diet may protect against future episodes of diverticulitis. Using NSAIDs may be associated with a moderately increased risk of occurrence of any episode of diverticulitis and complicated diverticulitis.   PLAN: - Repeat CT abd/pelvis - she has an allergy to contrast - CMP, CBC - Encouraged exercise - High fiber diet - Lifestyle modifications to avoid recurrent diverticulitis - Continue to use your Align every day - Trial of Xifaxan 550 mg TID x 14 days - Follow-up in 3-4 weeks, earlier if needed - Low threshold for surgical follow-up  I spent over 40 minutes, including in depth chart review, independent review of results, communicating results with the patient directly, face-to-face time with the patient, coordinating care, ordering studies and medications as appropriate, and documentation.    HPI: [name] is a 80 y.o. female recently seen by Dr. Ardis Hughs. She returns in follow-up after his outpatient consultation 11/10/20 and her hospitalization 11/22/20-11/25/20. This is my first office visit with the patient, whom I have former met for surveillance colonoscopy.  The interval history is obtained through the patient and review of her electronic health record.  She brings a  three ring binder, a typed out letter of her concerns, a calendar documenting all of the medications that she has been taking, records and information that she has printed off the internet and records from her recent hospitalization. She has a bag with her with a change of clothes in the event that she needs them due to concerns about sporadic and unpredictable symptoms.  She was seen by Dr. Ardis Hughs for bloating and change in bowel habits. They felt her symptoms may be related to high intake of fruits over several days. Labs at that time of that visit included a normal CMP, TSH and CBC.  She was hospitalized earlier this month with acute ileal diverticulitis with contained perforation and without abscess. CT during that hospitalization also showed colonic diverticulosis without diverticulitis. She was afebrile and had a normal WBC. She was seen by surgery and treated conservatively. She was treated with IV antibiotics Rocephin/Flagyl and bowel rest and discharged to complete 10 days of Augmentin.   Since hospital discharge, she has had ongoing abdominal pain, diffuse abdominal soreness, and intermittent explosive diarrhea. Doesn't feel like her symptoms have improved since her hospitalization.  Treating pain with Tylenol.   Ate Stameys yesterday and developed recurrent diarrhea. She has been using PeptoBismal. Her stools are now loose and black.   No fevers, chills, night sweats. No peritoneal signs.   Endoscopic history: Colonoscopy 11/14/17: >53m multilobulated tubular adenoma removed in a piecemeal fashion Colonoscopy 08/27/18: 573mtubular adenoma in the ascending colon, pancolonic diverticulosis  Past Medical History:  Diagnosis Date   Allergy    Anxiety    Back abscess    Carotid stenosis    R 1-39; L no stenosis >> rpt 12/2020 //  carotid US 9/21: No significant ICA stenosis bilaterally   Cataract    just watching - bilateral   Diverticulitis    GERD (gastroesophageal reflux disease)     History of kidney stones    first stone now   History of syncope    Echocardiogram 9/21: EF 60-65, no RWMA, GR 1 DD, normal RVSF, RVSP 20.5   Hx of adenomatous colonic polyps    Hyperlipidemia    Hypertension    IBS (irritable bowel syndrome)    Lichen planus    Osteoporosis    PUD (peptic ulcer disease)    PVC (premature ventricular contraction)    Stroke (HCC)    Thyroid disease    TIA (transient ischemic attack) summer 2015    Past Surgical History:  Procedure Laterality Date   ABDOMINAL HYSTERECTOMY     complete   APPENDECTOMY     CARPAL TUNNEL RELEASE     left   COLONOSCOPY     polyps   CYSTOSCOPY WITH RETROGRADE PYELOGRAM, URETEROSCOPY AND STENT PLACEMENT Left 07/06/2015   Procedure: CYSTOSCOPY WITH LEFT RETROGRADE PYELOGRAM AND INTERPRETATION, LEFT URETEROSCOPY WITH DUAL LUMEN PYELOSCOPY, IDENTIFCATION AND DILATION OF URETERAL STRICTURE, JJ STENT REMOVAL, BASKET EXTRACTION OF RENAL PELVIS STONE, EXTRACTION OF ORGANIZED RENAL PELVIC BLOOD CLOT;  Surgeon: Carolan Clines, MD;  Location: [facility];  Service: Urology;  Laterality: Left;  PLACEMENT OF LEFT DOUBLE J STEN   CYSTOSCOPY/RETROGRADE/URETEROSCOPY/STONE EXTRACTION WITH BASKET Left 05/08/2015   Procedure: CYSTOSCOPY/RETROGRADE/URETEROSCOPY/JJ STENT PLACEMENT;  Surgeon: Carolan Clines, MD;  Location: [facility];  Service: Urology;  Laterality: Left;   WRIST SURGERY     left     Current Outpatient Medications  Medication Sig Dispense Refill   acetaminophen (TYLENOL) 500 MG tablet Take 500-1,000 mg by mouth every 6 (six) hours as needed for moderate pain.     alum & mag hydroxide-simeth (MYLANTA MAXIMUM STRENGTH) 400-400-40 MG/5ML suspension Take 10 mLs by mouth every 6 (six) hours as needed for indigestion.     aspirin EC 81 MG tablet Take 1 tablet (81 mg total) by mouth daily. 30 tablet 3   bifidobacterium infantis (ALIGN) capsule Take 1 capsule by mouth daily.     bismuth subsalicylate (PEPTO-BISMOL) 262 MG/15ML  suspension Take 30 mLs by mouth daily as needed for indigestion.     Cyanocobalamin (VITAMIN B 12 PO) Take 5,000 mcg by mouth daily.     denosumab (PROLIA) 60 MG/ML SOSY injection Inject 60 mg into the skin every 6 (six) months.     desoximetasone (TOPICORT) 0.25 % cream Apply 1 application topically 2 (two) times daily as needed (lichen planus on legs).      dicyclomine (BENTYL) 10 MG capsule Take 1 capsule (10 mg total) by mouth 2 (two) times daily as needed for spasms.     diltiazem (CARDIZEM) 30 MG tablet TAKE 1 TABLET (30 MG TOTAL) BY MOUTH EVERY 6 (SIX) HOURS AS NEEDED (PALPITATIONS). 120 tablet 3   docusate sodium (STOOL SOFTENER) 100 MG capsule Take 1 capsule (100 mg total) by mouth 2 (two) times daily. (Patient taking differently: Take 100 mg by mouth daily as needed for mild constipation.) 10 capsule 0   ENSURE PLUS (ENSURE PLUS) LIQD Take 350 mLs by mouth daily.      ergocalciferol (VITAMIN D2) 50000 UNITS capsule Take 50,000 Units by mouth See admin instructions. Takes one capsule on weeks 1 through 3 of each month on Saturdays and then one capsule on week 4 on Mondays and Thursdays. (for a  total of 5 capsules per month).     escitalopram (LEXAPRO) 20 MG tablet Take 15 mg by mouth daily.     esomeprazole (NEXIUM) 20 MG capsule Take 20 mg by mouth daily as needed (heartburn).     famotidine (PEPCID) 20 MG tablet Take 20 mg by mouth 2 (two) times daily as needed for heartburn.     fluticasone (FLONASE) 50 MCG/ACT nasal spray Place 1 spray into both nostrils daily as needed for allergies.     meclizine (ANTIVERT) 25 MG tablet Take 1 tablet (25 mg total) by mouth daily as needed for dizziness or nausea. 90 tablet 3   Multiple Vitamins-Minerals (CENTRUM SILVER 50+WOMEN) TABS Take 1 capsule by mouth daily.     pravastatin (PRAVACHOL) 20 MG tablet Take 20 mg by mouth at bedtime.     No current facility-administered medications for this visit.    Allergies as of 12/18/2020 - Review Complete  12/18/2020  Allergen Reaction Noted   Penicillins Swelling 07/17/2007   Sulfonamide derivatives Hives and Rash 07/17/2007   Ibandronic acid  10/20/2020   Simvastatin Other (See Comments) 11/23/2020   Iodinated diagnostic agents Other (See Comments) 07/05/2013   Metronidazole Diarrhea and Other (See Comments) 07/11/2012   Prednisone Other (See Comments) 07/11/2012    Family History  Problem Relation Age of Onset   Glaucoma Mother    Macular degeneration Mother    Stroke Mother    Hypertension Mother    Lung cancer Father    Heart attack Father    Cancer Father    Lung cancer Brother    Non-Hodgkin's lymphoma Brother    Diabetes Brother    Atrial fibrillation Brother    Tuberculosis Paternal Grandfather    Colon cancer Paternal Grandmother 69   Cancer Paternal Grandmother    Heart attack Maternal Grandfather 72   Cancer Other    Breast cancer Neg Hx    Colon polyps Neg Hx    Esophageal cancer Neg Hx    Rectal cancer Neg Hx    Stomach cancer Neg Hx       Physical Exam: General:   Alert,  well-nourished, pleasant and cooperative in NAD Head:  Normocephalic and atraumatic. Eyes:  Sclera clear, no icterus.   Conjunctiva pink. Abdomen:  Soft, mild diffuse tenderness, nondistended, normal bowel sounds, no rebound or guarding. No hepatosplenomegaly.  No tympany. Rectal:  Deferred  Msk:  Symmetrical. No boney deformities LAD: No inguinal or umbilical LAD Extremities:  No clubbing or edema. Neurologic:  Alert and  oriented x4;  grossly nonfocal Skin:  Intact without significant lesions or rashes. Psych:  Alert and cooperative. Normal mood and affect.    Lanecia Sliva L. Tarri Glenn, MD, MPH 12/18/2020, 3:31 PM

## 2020-12-21 ENCOUNTER — Telehealth: Payer: Self-pay | Admitting: Gastroenterology

## 2020-12-21 NOTE — Telephone Encounter (Signed)
Inbound call from patient states xifaxan is $2600 and insurance will not cover. Patient is requesting an alternative

## 2020-12-21 NOTE — Telephone Encounter (Signed)
We can try to use [facility] pharmacy

## 2020-12-22 DIAGNOSIS — R3121 Asymptomatic microscopic hematuria: Secondary | ICD-10-CM | POA: Diagnosis not present

## 2020-12-22 DIAGNOSIS — I6523 Occlusion and stenosis of bilateral carotid arteries: Secondary | ICD-10-CM | POA: Diagnosis not present

## 2020-12-22 DIAGNOSIS — R82998 Other abnormal findings in urine: Secondary | ICD-10-CM | POA: Diagnosis not present

## 2020-12-22 DIAGNOSIS — G459 Transient cerebral ischemic attack, unspecified: Secondary | ICD-10-CM | POA: Diagnosis not present

## 2020-12-22 DIAGNOSIS — I129 Hypertensive chronic kidney disease with stage 1 through stage 4 chronic kidney disease, or unspecified chronic kidney disease: Secondary | ICD-10-CM | POA: Diagnosis not present

## 2020-12-22 DIAGNOSIS — M810 Age-related osteoporosis without current pathological fracture: Secondary | ICD-10-CM | POA: Diagnosis not present

## 2020-12-22 DIAGNOSIS — N393 Stress incontinence (female) (male): Secondary | ICD-10-CM | POA: Diagnosis not present

## 2020-12-22 DIAGNOSIS — Z0000 Encounter for general adult medical examination without abnormal findings: Secondary | ICD-10-CM | POA: Diagnosis not present

## 2020-12-22 DIAGNOSIS — R7301 Impaired fasting glucose: Secondary | ICD-10-CM | POA: Diagnosis not present

## 2020-12-22 DIAGNOSIS — E785 Hyperlipidemia, unspecified: Secondary | ICD-10-CM | POA: Diagnosis not present

## 2020-12-22 DIAGNOSIS — I10 Essential (primary) hypertension: Secondary | ICD-10-CM | POA: Diagnosis not present

## 2020-12-22 MED ORDER — RIFAXIMIN 550 MG PO TABS: 550.0000 mg | ORAL_TABLET | Freq: Three times a day (TID) | ORAL | 0 refills | Status: DC

## 2020-12-22 NOTE — Telephone Encounter (Signed)
Sent Servando Snare actually to [facility] to see if they can  get approved   Called patient she has already paid for the [facility] copay which was $145. She has not started it yet because she wanted to talk with her PCP first.   Will cancel order with [facility]

## 2020-12-23 ENCOUNTER — Telehealth: Payer: Self-pay | Admitting: Gastroenterology

## 2020-12-23 NOTE — Telephone Encounter (Signed)
Returned pt call. States she attempted to got to the BR this morning but while straining to defecate, was unable to produce results. States she is feeling bloated and uncomfortable. Denies rectal bleeding or rectal pain. Also denies abd pain, N/V. Recommended pt remain hydrated and to a bowel purge to aid in effective defecation and colon cleanse. Pt requested instructions be sent via My Chart. Refer to encounters. Advised pt to call if this is ineffective. Also recommended pt keep CT as scheduled. Verbalized acceptance and understanding of all instructions provided.

## 2020-12-23 NOTE — Telephone Encounter (Signed)
Patient called states she has not been able to have a BM for two days now and is wondering if she can take Miralax also has a CT scan for tomorrow and is afraid she wont be able to go to the bathroom. Please call her soon to advise so she can start.

## 2020-12-24 ENCOUNTER — Other Ambulatory Visit: Payer: Self-pay

## 2020-12-24 ENCOUNTER — Ambulatory Visit (HOSPITAL_COMMUNITY)
Admission: RE | Admit: 2020-12-24 | Discharge: 2020-12-24 | Disposition: A | Discharge status: Home / Self Care | Payer: Medicare PPO | Source: Ambulatory Visit | Attending: Gastroenterology | Admitting: Gastroenterology

## 2020-12-24 DIAGNOSIS — K5700 Diverticulitis of small intestine with perforation and abscess without bleeding: Secondary | ICD-10-CM | POA: Diagnosis not present

## 2020-12-24 DIAGNOSIS — R194 Change in bowel habit: Secondary | ICD-10-CM | POA: Diagnosis not present

## 2020-12-24 DIAGNOSIS — K5730 Diverticulosis of large intestine without perforation or abscess without bleeding: Secondary | ICD-10-CM | POA: Diagnosis not present

## 2020-12-24 DIAGNOSIS — K7689 Other specified diseases of liver: Secondary | ICD-10-CM | POA: Diagnosis not present

## 2020-12-24 DIAGNOSIS — R1030 Lower abdominal pain, unspecified: Secondary | ICD-10-CM | POA: Insufficient documentation

## 2020-12-24 DIAGNOSIS — N281 Cyst of kidney, acquired: Secondary | ICD-10-CM | POA: Diagnosis not present

## 2020-12-24 DIAGNOSIS — K219 Gastro-esophageal reflux disease without esophagitis: Secondary | ICD-10-CM

## 2020-12-24 DIAGNOSIS — K5712 Diverticulitis of small intestine without perforation or abscess without bleeding: Secondary | ICD-10-CM | POA: Diagnosis not present

## 2020-12-24 MED ORDER — FAMOTIDINE 20 MG PO TABS
20.0000 mg | ORAL_TABLET | Freq: Two times a day (BID) | ORAL | Status: DC
Start: 2020-12-24 — End: 2021-03-10

## 2020-12-25 ENCOUNTER — Telehealth: Payer: Self-pay | Admitting: Gastroenterology

## 2020-12-25 NOTE — Telephone Encounter (Signed)
    Patient calling for lab results 

## 2020-12-25 NOTE — Telephone Encounter (Signed)
Returned pt call. States she is not wanting lab results but rather had questions about her CT. Pt also wanted to move her appt up to a sooner date to discuss her concerns. She is aware that Dr. Tarri Glenn felt her CT was normal but states, "I am concerned" and listed the following:  "I have cysts on my kidneys" "Plaque build up in my aorta" "I have cysts in my liver"  Reassured that her recent CT was compared to her previous CT from 10/2. From a GI perspective, Dr. Tarri Glenn felt the results were overall reassuring and did not warrant any further GI recommendations at this time. Advised, given her concerns re: the cysts seen in her kidneys and "plaque" to her aorta, recommended she address those concerns with her PCP and cardiologist. Asked if she was having any abd pain, changes and/or altered bowel habits, rectal pain, rectal bleeding, N/V, etc. Pt denied having any GI symptoms at this time. Advised we will plan to keep her appt as scheduled. However, if she develops any alarming GI symptoms, she is welcome to call the office to change her appt, if needed. Reassured, that while both CT's mention hypodense structures to her liver, both CT's also mention the structures are too small to characterize. That said, Dr. Tarri Glenn response to the CT results are most appropriate. Verbalized acceptance and understanding.

## 2021-01-05 DIAGNOSIS — I129 Hypertensive chronic kidney disease with stage 1 through stage 4 chronic kidney disease, or unspecified chronic kidney disease: Secondary | ICD-10-CM | POA: Diagnosis not present

## 2021-01-05 DIAGNOSIS — K589 Irritable bowel syndrome without diarrhea: Secondary | ICD-10-CM | POA: Diagnosis not present

## 2021-01-05 DIAGNOSIS — R946 Abnormal results of thyroid function studies: Secondary | ICD-10-CM | POA: Diagnosis not present

## 2021-01-05 DIAGNOSIS — I493 Ventricular premature depolarization: Secondary | ICD-10-CM | POA: Diagnosis not present

## 2021-01-05 DIAGNOSIS — I491 Atrial premature depolarization: Secondary | ICD-10-CM | POA: Diagnosis not present

## 2021-01-05 DIAGNOSIS — E785 Hyperlipidemia, unspecified: Secondary | ICD-10-CM | POA: Diagnosis not present

## 2021-01-05 DIAGNOSIS — R002 Palpitations: Secondary | ICD-10-CM | POA: Diagnosis not present

## 2021-01-05 DIAGNOSIS — F419 Anxiety disorder, unspecified: Secondary | ICD-10-CM | POA: Diagnosis not present

## 2021-01-20 ENCOUNTER — Other Ambulatory Visit: Payer: Self-pay

## 2021-01-20 ENCOUNTER — Ambulatory Visit: Payer: Medicare PPO | Admitting: Physician Assistant

## 2021-01-20 ENCOUNTER — Encounter: Payer: Self-pay | Admitting: Physician Assistant

## 2021-01-20 VITALS — BP 140/82 | HR 73 | Ht 63.0 in | Wt 142.6 lb

## 2021-01-20 DIAGNOSIS — I351 Nonrheumatic aortic (valve) insufficiency: Secondary | ICD-10-CM | POA: Diagnosis not present

## 2021-01-20 DIAGNOSIS — I10 Essential (primary) hypertension: Secondary | ICD-10-CM | POA: Diagnosis not present

## 2021-01-20 DIAGNOSIS — I493 Ventricular premature depolarization: Secondary | ICD-10-CM | POA: Diagnosis not present

## 2021-01-20 DIAGNOSIS — I6523 Occlusion and stenosis of bilateral carotid arteries: Secondary | ICD-10-CM

## 2021-01-20 DIAGNOSIS — I700 Atherosclerosis of aorta: Secondary | ICD-10-CM | POA: Diagnosis not present

## 2021-01-20 DIAGNOSIS — I340 Nonrheumatic mitral (valve) insufficiency: Secondary | ICD-10-CM

## 2021-01-20 NOTE — Patient Instructions (Signed)
Medication Instructions:  Your physician recommends that you continue on your current medications as directed. Please refer to the Current Medication list given to you today.  *If you need a refill on your cardiac medications before your next appointment, please call your pharmacy*   Lab Work: None If you have labs (blood work) drawn today and your tests are completely normal, you will receive your results only by: [place] (if you have MyChart) OR A paper copy in the mail If you have any lab test that is abnormal or we need to change your treatment, we will call you to review the results.   Follow-Up: At [facility], you and your health needs are our priority.  As part of our continuing mission to provide you with exceptional heart care, we have created designated Provider Care Teams.  These Care Teams include your primary Cardiologist (physician) and Advanced Practice Providers (APPs -  Physician Assistants and Nurse Practitioners) who all work together to provide you with the care you need, when you need it.   Your next appointment:   1 year(s)  The format for your next appointment:   In Person  Provider:   Lauree Chandler, MD

## 2021-01-20 NOTE — Progress Notes (Signed)
Cardiology Office Note    Date:  01/20/2021   ID:  [name], DOB 03-Oct-1940, MRN [id number]   PCP:  Crist Infante, [place]  Cardiologist:  Lauree Chandler, MD   Advanced Practice Provider:  No care team member to display Electrophysiologist:  None   [phone]   No chief complaint on file.   History of Present Illness:  [name] is a 80 y.o. female with history of aortic valve insufficiency, mitral valve regurgitation, severe anxiety, PVCs, hyperlipidemia, HTN, and multiple GI issues including diverticulosis, PUD and GERD along with IBS and fibromyalgia.  She is followed in our office for PVCs  48 hour Holter monitor in 2011 showed frequent PVCs with some trigeminy. Echo March 2016 with LVEF 60%, mild MR, mild AI. Stress myoview in August 2015 with no ischemia. 30 day event monitor August 2015 with PVCs but no SVT, atrial fib, VT. No pauses or bradycardia. Metoprolol was changed to Cardizem. She was seen by Dr. Caryl Comes 12/12/13 and no changes were made. Carotid artery dopplers November 2019 with mild bilateral disease. She had a syncopal event in August 2021 after leaning over to pick up her dog's bowl. Echo September 2021 with LVEF=60-65% with grade 1 diastolic dysfunction. No significant AI. 30 day monitor September 2021 with sinus PVCs.   Patient last fell Dr. Angelena Form 11/28/2019 and was doing well no longer on long-acting diltiazem because of soft blood pressures and taking diltiazem 30 mg as needed.  Patient added onto my schedule for follow-up of aortic atherosclerosis without aneurysm noted on recent CT scan. She brings in a lot of paper work of her meds, BP and Kardia  readings which are normal. Now on increased pravachol and Repatha started. No chest pain, dyspnea. Does have palpitations and anxiety relieved with deep breathing.No regular exercise but going to start going to the [facility].     Past Medical History:   Diagnosis Date   Allergy    Anxiety    Back abscess    Carotid stenosis    R 1-39; L no stenosis >> rpt 12/2020 // carotid US 9/21: No significant ICA stenosis bilaterally   Cataract    just watching - bilateral   Diverticulitis    GERD (gastroesophageal reflux disease)    History of kidney stones    first stone now   History of syncope    Echocardiogram 9/21: EF 60-65, no RWMA, GR 1 DD, normal RVSF, RVSP 20.5   Hx of adenomatous colonic polyps    Hyperlipidemia    Hypertension    IBS (irritable bowel syndrome)    Lichen planus    Osteoporosis    PUD (peptic ulcer disease)    PVC (premature ventricular contraction)    Stroke (HCC)    Thyroid disease    TIA (transient ischemic attack) summer 2015    Past Surgical History:  Procedure Laterality Date   ABDOMINAL HYSTERECTOMY     complete   APPENDECTOMY     CARPAL TUNNEL RELEASE     left   COLONOSCOPY     polyps   CYSTOSCOPY WITH RETROGRADE PYELOGRAM, URETEROSCOPY AND STENT PLACEMENT Left 07/06/2015   Procedure: CYSTOSCOPY WITH LEFT RETROGRADE PYELOGRAM AND INTERPRETATION, LEFT URETEROSCOPY WITH DUAL LUMEN PYELOSCOPY, IDENTIFCATION AND DILATION OF URETERAL STRICTURE, JJ STENT REMOVAL, BASKET EXTRACTION OF RENAL PELVIS STONE, EXTRACTION OF ORGANIZED RENAL PELVIC BLOOD CLOT;  Surgeon: Carolan Clines, MD;  Location: [facility];  Service: Urology;  Laterality: Left;  PLACEMENT OF LEFT DOUBLE J STEN   CYSTOSCOPY/RETROGRADE/URETEROSCOPY/STONE EXTRACTION WITH BASKET Left 05/08/2015   Procedure: CYSTOSCOPY/RETROGRADE/URETEROSCOPY/JJ STENT PLACEMENT;  Surgeon: Carolan Clines, MD;  Location: [facility];  Service: Urology;  Laterality: Left;   WRIST SURGERY     left    Current Medications: Current Meds  Medication Sig   acetaminophen (TYLENOL) 500 MG tablet Take 500-1,000 mg by mouth every 6 (six) hours as needed for moderate pain.   alum & mag hydroxide-simeth (MYLANTA MAXIMUM STRENGTH) 400-400-40 MG/5ML suspension Take 10 mLs by  mouth every 6 (six) hours as needed for indigestion.   aspirin EC 81 MG tablet Take 1 tablet (81 mg total) by mouth daily.   bifidobacterium infantis (ALIGN) capsule Take 1 capsule by mouth daily.   bismuth subsalicylate (PEPTO-BISMOL) 262 MG/15ML suspension Take 30 mLs by mouth daily as needed for indigestion.   Cyanocobalamin (VITAMIN B 12 PO) Take 5,000 mcg by mouth daily.   denosumab (PROLIA) 60 MG/ML SOSY injection Inject 60 mg into the skin every 6 (six) months.   desoximetasone (TOPICORT) 0.25 % cream Apply 1 application topically 2 (two) times daily as needed (lichen planus on legs).    dicyclomine (BENTYL) 10 MG capsule Take 1 capsule (10 mg total) by mouth 2 (two) times daily as needed for spasms.   diltiazem (CARDIZEM) 30 MG tablet TAKE 1 TABLET (30 MG TOTAL) BY MOUTH EVERY 6 (SIX) HOURS AS NEEDED (PALPITATIONS).   docusate sodium (STOOL SOFTENER) 100 MG capsule Take 1 capsule (100 mg total) by mouth 2 (two) times daily. (Patient taking differently: Take 100 mg by mouth daily as needed for mild constipation.)   ENSURE PLUS (ENSURE PLUS) LIQD Take 350 mLs by mouth daily.    ergocalciferol (VITAMIN D2) 50000 UNITS capsule Take 50,000 Units by mouth See admin instructions. Takes one capsule on weeks 1 through 3 of each month on Saturdays and then one capsule on week 4 on Mondays and Thursdays. (for a total of 5 capsules per month).   escitalopram (LEXAPRO) 20 MG tablet Take 15 mg by mouth daily.   esomeprazole (NEXIUM) 20 MG capsule Take 20 mg by mouth daily as needed (heartburn).   Evolocumab (REPATHA SURECLICK) 923 MG/ML SOAJ Inject into the skin.   famotidine (PEPCID) 20 MG tablet Take 1 tablet (20 mg total) by mouth 2 (two) times daily.   fluticasone (FLONASE) 50 MCG/ACT nasal spray Place 1 spray into both nostrils daily as needed for allergies.   meclizine (ANTIVERT) 25 MG tablet Take 1 tablet (25 mg total) by mouth daily as needed for dizziness or nausea.   Multiple Vitamins-Minerals  (CENTRUM SILVER 50+WOMEN) TABS Take 1 capsule by mouth daily.   pravastatin (PRAVACHOL) 20 MG tablet Take 40 mg by mouth at bedtime.   rifaximin (XIFAXAN) 550 MG TABS tablet Take 1 tablet (550 mg total) by mouth 3 (three) times daily.     Allergies:   Penicillins, Sulfonamide derivatives, Ibandronic acid, Simvastatin, Iodinated diagnostic agents, Metronidazole, and Prednisone   Social History   Socioeconomic History   Marital status: Widowed    Spouse name: Not on file   Number of children: Not on file   Years of education: Not on file   Highest education level: Not on file  Occupational History   Not on file  Tobacco Use   Smoking status: Never   Smokeless tobacco: Never  Vaping Use   Vaping Use: Never used  Substance and Sexual Activity   Alcohol use: No   Drug use:  No   Sexual activity: Not on file    Comment: Hysterectomy  Other Topics Concern   Not on file  Social History Narrative   Patient lives alone and has no children or relatives in [place] Determinants of Health   Financial Resource Strain: Not on file  Food Insecurity: Not on file  Transportation Needs: Not on file  Physical Activity: Not on file  Stress: Not on file  Social Connections: Not on file     Family History:  The patient's  family history includes Atrial fibrillation in her brother; Cancer in her father, paternal grandmother, and another family member; Colon cancer (age of onset: 77) in her paternal grandmother; Diabetes in her brother; Glaucoma in her mother; Heart attack in her father; Heart attack (age of onset: 73) in her maternal grandfather; Hypertension in her mother; Lung cancer in her brother and father; Macular degeneration in her mother; Non-Hodgkin's lymphoma in her brother; Stroke in her mother; Tuberculosis in her paternal grandfather.   ROS:   Please see the history of present illness.    ROS All other systems reviewed and are negative.   PHYSICAL EXAM:   VS:  BP  140/82   Pulse 73   Ht 5\' 3"  (1.6 m)   Wt 142 lb 9.6 oz (64.7 kg)   LMP  (LMP Unknown)   SpO2 98%   BMI 25.26 kg/m   Physical Exam  GEN: Well nourished, well developed, in no acute distress  Neck: no JVD, carotid bruits, or masses Cardiac:RRR; 1/6 diastolic murmur LSB Respiratory:  clear to auscultation bilaterally, normal work of breathing GI: soft, nontender, nondistended, + BS Ext: without cyanosis, clubbing, or edema, Good distal pulses bilaterally Neuro:  Alert and Oriented x 3 Psych: euthymic mood, full affect  Wt Readings from Last 3 Encounters:  01/20/21 142 lb 9.6 oz (64.7 kg)  12/18/20 140 lb 8 oz (63.7 kg)  11/23/20 144 lb (65.3 kg)      Studies/Labs Reviewed:   EKG:  EKG is  ordered today.  The ekg ordered today demonstrates  NSR, low voltage  Recent Labs: 11/10/2020: TSH 2.41 12/18/2020: ALT 17; BUN 15; Creatinine, Ser 0.82; Hemoglobin 14.0; Platelets 200.0; Potassium 3.7; Sodium 141   Lipid Panel No results found for: CHOL, TRIG, HDL, CHOLHDL, VLDL, LDLCALC, LDLDIRECT  Additional studies/ records that were reviewed today include:  2D echo 04/30/2020 IMPRESSIONS     1. Left ventricular ejection fraction, by estimation, is 55%. The left  ventricle has normal function. The left ventricle has no regional wall  motion abnormalities. Left ventricular diastolic parameters are consistent  with Grade I diastolic dysfunction  (impaired relaxation). The average left ventricular global longitudinal  strain is -20.5 %. The global longitudinal strain is normal.   2. Right ventricular systolic function is normal. The right ventricular  size is normal. There is normal pulmonary artery systolic pressure.   3. Left atrial size was mildly dilated.   4. The mitral valve is normal in structure. Moderate mitral valve  regurgitation. No evidence of mitral stenosis. Moderate mitral annular  calcification.   5. The aortic valve is normal in structure. Aortic valve  regurgitation is  trivial. No aortic stenosis is present.   6. The inferior vena cava is normal in size with greater than 50%  respiratory variability, suggesting right atrial pressure of 3 mmHg.   Comparison(s): No significant change from prior study. 10/30/19 EF 60-65%.   Holter monitor 11/2019 Study Highlights  Sinus  rhythm with frequent PVCs (1% burden).    NST 2015 IMPRESSION: 1. No acute findings identified within the abdomen or pelvis. 2. Interval improvement in previously noted inflammatory changes associated with ileal diverticulitis. No focal fluid collections identified. 3. Aortic Atherosclerosis (ICD10-I70.0).       Risk Assessment/Calculations:         ASSESSMENT:    1. Aortic atherosclerosis ([place])   2. PVC's (premature ventricular contractions)   3. Essential hypertension   4. Non-rheumatic mitral regurgitation   5. Nonrheumatic aortic valve insufficiency   6. Bilateral carotid artery stenosis      PLAN:  In order of problems listed above:  Aortic atherosclerosis noted on recent abdominal CT-no aneurysm, asymptomatic. On Pravastatin and just started Repatha. Offered Coronary CTA but she said she is willing to hold off for now. Reassurance given and she'll call if she needs anything.  PVCs: She has rare palpitations. Continue diltiazem 30 mg as needed. No longer on  long acting Cardizem due to soft BP. 150 min exercise weekly.   HTN: BP is well controlled. Continue current therapy   Mitral regurgitation: Moderate MR on echo 04/2020 due for repeat in March   Aortic valve insufficiency: No significant AI by echo in 2021.    Carotid artery disease: Mild bilateral disease by dopplers September 2021.     Shared Decision Making/Informed Consent        Medication Adjustments/Labs and Tests Ordered: Current medicines are reviewed at length with the patient today.  Concerns regarding medicines are outlined above.  Medication changes, Labs and Tests  ordered today are listed in the Patient Instructions below. Patient Instructions  Medication Instructions:  Your physician recommends that you continue on your current medications as directed. Please refer to the Current Medication list given to you today.  *If you need a refill on your cardiac medications before your next appointment, please call your pharmacy*   Lab Work: None If you have labs (blood work) drawn today and your tests are completely normal, you will receive your results only by: [facility] (if you have MyChart) OR A paper copy in the mail If you have any lab test that is abnormal or we need to change your treatment, we will call you to review the results.   Follow-Up: At [facility], you and your health needs are our priority.  As part of our continuing mission to provide you with exceptional heart care, we have created designated Provider Care Teams.  These Care Teams include your primary Cardiologist (physician) and Advanced Practice Providers (APPs -  Physician Assistants and Nurse Practitioners) who all work together to provide you with the care you need, when you need it.   Your next appointment:   1 year(s)  The format for your next appointment:   In Person  Provider:   Lauree Chandler, MD     Signed, Ermalinda Barrios, PA-C  01/20/2021 1:27 PM    [facility] [address], [place], [place]  [address] Phone: [phone]; Fax: [phone]

## 2021-01-28 DIAGNOSIS — Z23 Encounter for immunization: Secondary | ICD-10-CM | POA: Diagnosis not present

## 2021-01-29 ENCOUNTER — Encounter: Payer: Self-pay | Admitting: Gastroenterology

## 2021-01-29 ENCOUNTER — Ambulatory Visit: Payer: Medicare PPO | Admitting: Gastroenterology

## 2021-01-29 VITALS — BP 96/60 | HR 88 | Ht 62.0 in | Wt 143.2 lb

## 2021-01-29 DIAGNOSIS — K6389 Other specified diseases of intestine: Secondary | ICD-10-CM

## 2021-01-29 DIAGNOSIS — K5700 Diverticulitis of small intestine with perforation and abscess without bleeding: Secondary | ICD-10-CM

## 2021-01-29 NOTE — Patient Instructions (Signed)
If you are age 81 or older, your body mass index should be between 23-30. Your Body mass index is 26.2 kg/m. If this is out of the aforementioned range listed, please consider follow up with your Primary Care Provider.  If you are age 65 or younger, your body mass index should be between 19-25. Your Body mass index is 26.2 kg/m. If this is out of the aformentioned range listed, please consider follow up with your Primary Care Provider.   ________________________________________________________  The [place] [facility] providers would like to encourage you to use [facility] to communicate with providers for non-urgent requests or questions.  Due to long hold times on the telephone, sending your provider a message by [facility] may be a faster and more efficient way to get a response.  Please allow 48 business hours for a response.  Please remember that this is for non-urgent requests.  _______________________________________________________  Follow up in 1 year.  It was a pleasure to see you today!  Thank you for trusting me with your gastrointestinal care!

## 2021-01-29 NOTE — Progress Notes (Signed)
Referring Provider: Crist Infante, MD Primary Care Physician:  Crist Infante, MD  Chief complaint:  Recent hospitalization for diverticulitis   IMPRESSION:  Suspected SIBO after recent hospitalization for acute ileal diverticulitis with microperforation. Clinically improved after 2 weeks of Xifaxan.   Patient concerns about recurrent diverticulitis. Reviewed recommendations to follow a high fiber diet or to use fiber supplements on a regular basis. Minimizing red meat in the diet may protect against future episodes of diverticulitis. Using NSAIDs may be associated with a moderately increased risk of occurrence of any episode of diverticulitis and complicated diverticulitis. Unfortunately, there is no guaranteed way to prevent recurrent diverticulitis.   PLAN: - Continue to increase regular exercise - High fiber diet with daily Metamucil  - Lifestyle modifications to avoid recurrent diverticulitis - Continue to use your Align every day - Follow-up PRN although she would like to return at least annually  I spent 30 minutes, including in depth chart review, independent review of results, communicating results with the patient directly, face-to-face time with the patient, and documentation.    HPI: [name] is a 80 y.o. female who returns in follow-up.   Initially seen by Dr. Ardis Hughs for bloating and change in bowel habits. They felt her symptoms may be related to high intake of fruits over several days. Labs at that time of that visit included a normal CMP, TSH and CBC.  She was hospitalized earlier this year with acute ileal diverticulitis with contained perforation and without abscess. CT during that hospitalization also showed colonic diverticulosis without diverticulitis. She was seen by surgery and treated conservativelywith IV antibiotics Rocephin/Flagyl and bowel rest. She completed 10 days of Augmentin on discharge.   At the time of my initial office with her 12/18/20,   she was having persistent abdominal pain, diffuse abdominal soreness, and intermittent explosive diarrhea. Doesn't feel like her symptoms have improved since her hospitalization.  Treating pain with Tylenol.   CT abd/pelvis 12/24/20 showed improvement in the ileal diverticulitis, no acute findings  She completed 14 days of Xifaxan for possible concurrent SIBO.   Returns in follow-up. She finds that the Xifaxan improved her symptoms. Having one poorly formed bowel movement daily which she attributes to take 3 fiber pills daily. Having trouble getting enough water but she is trying. She is trying to increase her regular exercise as recommended by Dr. Abner Greenspan.  Previously used yoga. Anxious about the potential risk for recurrent diverticulitis.   No new complaints or concerns today.  Endoscopic history: Colonoscopy 11/14/17: >76mm multilobulated tubular adenoma removed in a piecemeal fashion Colonoscopy 08/27/18: 8mm tubular adenoma in the ascending colon, pancolonic diverticulosis  Past Medical History:  Diagnosis Date   Allergy    Anxiety    Back abscess    Carotid stenosis    R 1-39; L no stenosis >> rpt 12/2020 // carotid US 9/21: No significant ICA stenosis bilaterally   Cataract    just watching - bilateral   Diverticulitis    GERD (gastroesophageal reflux disease)    History of kidney stones    first stone now   History of syncope    Echocardiogram 9/21: EF 60-65, no RWMA, GR 1 DD, normal RVSF, RVSP 20.5   Hx of adenomatous colonic polyps    Hyperlipidemia    Hypertension    IBS (irritable bowel syndrome)    Lichen planus    Osteoporosis    PUD (peptic ulcer disease)    PVC (premature ventricular contraction)    Stroke ([place])  Thyroid disease    TIA (transient ischemic attack) summer 2015    Past Surgical History:  Procedure Laterality Date   ABDOMINAL HYSTERECTOMY     complete   APPENDECTOMY     CARPAL TUNNEL RELEASE     left   COLONOSCOPY     polyps   CYSTOSCOPY  WITH RETROGRADE PYELOGRAM, URETEROSCOPY AND STENT PLACEMENT Left 07/06/2015   Procedure: CYSTOSCOPY WITH LEFT RETROGRADE PYELOGRAM AND INTERPRETATION, LEFT URETEROSCOPY WITH DUAL LUMEN PYELOSCOPY, IDENTIFCATION AND DILATION OF URETERAL STRICTURE, JJ STENT REMOVAL, BASKET EXTRACTION OF RENAL PELVIS STONE, EXTRACTION OF ORGANIZED RENAL PELVIC BLOOD CLOT;  Surgeon: Carolan Clines, MD;  Location: [facility];  Service: Urology;  Laterality: Left;  PLACEMENT OF LEFT DOUBLE J STEN   CYSTOSCOPY/RETROGRADE/URETEROSCOPY/STONE EXTRACTION WITH BASKET Left 05/08/2015   Procedure: CYSTOSCOPY/RETROGRADE/URETEROSCOPY/JJ STENT PLACEMENT;  Surgeon: Carolan Clines, MD;  Location: [facility];  Service: Urology;  Laterality: Left;   WRIST SURGERY     left     Current Outpatient Medications  Medication Sig Dispense Refill   acetaminophen (TYLENOL) 500 MG tablet Take 500-1,000 mg by mouth every 6 (six) hours as needed for moderate pain.     alum & mag hydroxide-simeth (MYLANTA MAXIMUM STRENGTH) 400-400-40 MG/5ML suspension Take 10 mLs by mouth every 6 (six) hours as needed for indigestion.     aspirin EC 81 MG tablet Take 1 tablet (81 mg total) by mouth daily. 30 tablet 3   bifidobacterium infantis (ALIGN) capsule Take 1 capsule by mouth daily.     bisacodyl (DULCOLAX) 10 MG suppository Place 10 mg rectally as needed for moderate constipation.     bismuth subsalicylate (PEPTO-BISMOL) 262 MG/15ML suspension Take 30 mLs by mouth daily as needed for indigestion.     Cholecalciferol (VITAMIN D3 PO) Take by mouth. Take 1 tablet weekly for 3 weeks then 2 in the fours week     Cyanocobalamin (VITAMIN B 12 PO) Take 5,000 mcg by mouth daily.     desoximetasone (TOPICORT) 0.25 % cream Apply 1 application topically 2 (two) times daily as needed (lichen planus on legs).      dicyclomine (BENTYL) 10 MG capsule Take 1 capsule (10 mg total) by mouth 2 (two) times daily as needed for spasms.     diltiazem (CARDIZEM) 30 MG tablet TAKE 1  TABLET (30 MG TOTAL) BY MOUTH EVERY 6 (SIX) HOURS AS NEEDED (PALPITATIONS). 120 tablet 3   docusate sodium (STOOL SOFTENER) 100 MG capsule Take 1 capsule (100 mg total) by mouth 2 (two) times daily. (Patient taking differently: Take 100 mg by mouth as needed for mild constipation.) 10 capsule 0   ENSURE PLUS (ENSURE PLUS) LIQD Take 350 mLs by mouth daily.      ergocalciferol (VITAMIN D2) 50000 UNITS capsule Take 50,000 Units by mouth See admin instructions. Takes one capsule on weeks 1 through 3 of each month on Saturdays and then one capsule on week 4 on Mondays and Thursdays. (for a total of 5 capsules per month).     escitalopram (LEXAPRO) 10 MG tablet Take 10 mg by mouth daily.     escitalopram (LEXAPRO) 5 MG tablet Take 5 mg by mouth daily.     esomeprazole (NEXIUM) 20 MG capsule Take 20 mg by mouth daily as needed (heartburn).     Evolocumab (REPATHA SURECLICK) 174 MG/ML SOAJ Inject into the skin.     famotidine (PEPCID) 20 MG tablet Take 1 tablet (20 mg total) by mouth 2 (two) times daily.     fluticasone (  FLONASE) 50 MCG/ACT nasal spray Place 1 spray into both nostrils daily as needed for allergies.     meclizine (ANTIVERT) 25 MG tablet Take 1 tablet (25 mg total) by mouth daily as needed for dizziness or nausea. 90 tablet 3   Multiple Vitamins-Minerals (CENTRUM SILVER 50+WOMEN) TABS Take 1 capsule by mouth daily.     polyethylene glycol powder (GLYCOLAX/MIRALAX) 17 GM/SCOOP powder Take 17 g by mouth as needed.     pravastatin (PRAVACHOL) 20 MG tablet Take 40 mg by mouth at bedtime.     psyllium (METAMUCIL) 0.52 g capsule Take 1.04 g by mouth daily.     No current facility-administered medications for this visit.    Allergies as of 01/29/2021 - Review Complete 01/29/2021  Allergen Reaction Noted   Penicillins Swelling 07/17/2007   Sulfonamide derivatives Hives and Rash 07/17/2007   Boniva [ibandronic acid]  10/20/2020   Zocor [simvastatin] Other (See Comments) 11/23/2020   Flagyl  [metronidazole] Diarrhea and Other (See Comments) 07/11/2012   Iodinated diagnostic agents Other (See Comments) 07/05/2013   Prednisone Other (See Comments) 07/11/2012    Family History  Problem Relation Age of Onset   Glaucoma Mother    Macular degeneration Mother    Stroke Mother    Hypertension Mother    Lung cancer Father    Heart attack Father    Cancer Father    Lung cancer Brother    Non-Hodgkin's lymphoma Brother    Diabetes Brother    Atrial fibrillation Brother    Tuberculosis Paternal Grandfather    Colon cancer Paternal Grandmother 54   Cancer Paternal Grandmother    Heart attack Maternal Grandfather 72   Cancer Other    Breast cancer Neg Hx    Colon polyps Neg Hx    Esophageal cancer Neg Hx    Rectal cancer Neg Hx    Stomach cancer Neg Hx       Physical Exam: General:   Alert,  well-nourished, pleasant and cooperative in NAD Head:  Normocephalic and atraumatic. Eyes:  Sclera clear, no icterus.   Conjunctiva pink. Abdomen:  Soft, mild diffuse tenderness, nondistended, normal bowel sounds, no rebound or guarding. No hepatosplenomegaly.  No tympany. Neurologic:  Alert and  oriented x4;  grossly nonfocal Skin:  Intact without significant lesions or rashes. Psych:  Alert and cooperative. Normal mood and affect.    Ralf Konopka L. Tarri Glenn, MD, MPH 01/29/2021, 11:09 AM

## 2021-02-25 DIAGNOSIS — H524 Presbyopia: Secondary | ICD-10-CM | POA: Diagnosis not present

## 2021-02-25 DIAGNOSIS — H25813 Combined forms of age-related cataract, bilateral: Secondary | ICD-10-CM | POA: Diagnosis not present

## 2021-02-25 DIAGNOSIS — H5201 Hypermetropia, right eye: Secondary | ICD-10-CM | POA: Diagnosis not present

## 2021-02-25 DIAGNOSIS — H52223 Regular astigmatism, bilateral: Secondary | ICD-10-CM | POA: Diagnosis not present

## 2021-03-02 ENCOUNTER — Encounter (HOSPITAL_BASED_OUTPATIENT_CLINIC_OR_DEPARTMENT_OTHER): Payer: Self-pay

## 2021-03-02 ENCOUNTER — Emergency Department (HOSPITAL_BASED_OUTPATIENT_CLINIC_OR_DEPARTMENT_OTHER)
Admission: EM | Admit: 2021-03-02 | Discharge: 2021-03-02 | Disposition: A | Discharge status: Home / Self Care | Payer: Medicare PPO | Attending: Emergency Medicine | Admitting: Emergency Medicine

## 2021-03-02 ENCOUNTER — Other Ambulatory Visit: Payer: Self-pay

## 2021-03-02 ENCOUNTER — Emergency Department (HOSPITAL_BASED_OUTPATIENT_CLINIC_OR_DEPARTMENT_OTHER): Payer: Medicare PPO | Admitting: Radiology

## 2021-03-02 DIAGNOSIS — Z7982 Long term (current) use of aspirin: Secondary | ICD-10-CM | POA: Diagnosis not present

## 2021-03-02 DIAGNOSIS — I493 Ventricular premature depolarization: Secondary | ICD-10-CM

## 2021-03-02 DIAGNOSIS — R002 Palpitations: Secondary | ICD-10-CM

## 2021-03-02 LAB — CBC
HCT: 44.7 % (ref 36.0–46.0)
Hemoglobin: 14.9 g/dL (ref 12.0–15.0)
MCH: 30.7 pg (ref 26.0–34.0)
MCHC: 33.3 g/dL (ref 30.0–36.0)
MCV: 92 fL (ref 80.0–100.0)
Platelets: 248 10*3/uL (ref 150–400)
RBC: 4.86 MIL/uL (ref 3.87–5.11)
RDW: 13 % (ref 11.5–15.5)
WBC: 7.3 10*3/uL (ref 4.0–10.5)
nRBC: 0 % (ref 0.0–0.2)

## 2021-03-02 LAB — BASIC METABOLIC PANEL
Anion gap: 12 (ref 5–15)
BUN: 19 mg/dL (ref 8–23)
CO2: 26 mmol/L (ref 22–32)
Calcium: 9.6 mg/dL (ref 8.9–10.3)
Chloride: 105 mmol/L (ref 98–111)
Creatinine, Ser: 0.89 mg/dL (ref 0.44–1.00)
GFR, Estimated: >60 mL/min (ref >60)
Glucose, Bld: 135 mg/dL — ABNORMAL HIGH (ref 70–99)
Potassium: 3.7 mmol/L (ref 3.5–5.1)
Sodium: 143 mmol/L (ref 135–145)

## 2021-03-02 LAB — TROPONIN I (HIGH SENSITIVITY): Troponin I (High Sensitivity): 6 ng/L (ref <18)

## 2021-03-02 MED ORDER — POTASSIUM CHLORIDE CRYS ER 20 MEQ PO TBCR
40.0000 meq | EXTENDED_RELEASE_TABLET | Freq: Once | ORAL | Status: AC
  Administered 2021-03-02: 40 meq via ORAL
  Filled 2021-03-02: qty 2

## 2021-03-02 NOTE — ED Provider Notes (Signed)
[facility] Provider Note   CSN: [id number] Arrival date & time: 03/02/21  1533     History  Chief Complaint  Patient presents with   Palpitations    [name] is a 81 y.o. female.  Patient with hx pvcs, c/o palpitations in past couple days. Symptoms acute onset, moderate, recurrent, persistent, lasts seconds per episode. Pt w tracing showing occasional pvcs, hx same. Takes diltiazem prn when symptoms occur - took yesterday and improved, but did not take today (as wanted [place] to be able to see her issue/ecg).  No chest pain or discomfort. No sob or unusual doe. No abd pain or nvd. No gu c/o. No fever or chills. No heat intol or sweats. No recent thyroid issue or disease. No other recent change in meds, other than takes diltiazem prn. No swelling or leg pain. No syncope.   The history is provided by the patient and medical records.  Palpitations Associated symptoms: no back pain, no chest pain, no shortness of breath and no vomiting       Home Medications Prior to Admission medications   Medication Sig Start Date End Date Taking? Authorizing Provider  acetaminophen (TYLENOL) 500 MG tablet Take 500-1,000 mg by mouth every 6 (six) hours as needed for moderate pain.    [provider]  alum & mag hydroxide-simeth (MYLANTA MAXIMUM STRENGTH) 400-400-40 MG/5ML suspension Take 10 mLs by mouth every 6 (six) hours as needed for indigestion. 05/30/19   [provider]  aspirin EC 81 MG tablet Take 1 tablet (81 mg total) by mouth daily. 12/18/12   Burnell Blanks, MD  bifidobacterium infantis (ALIGN) capsule Take 1 capsule by mouth daily. 05/09/12   [provider]  bisacodyl (DULCOLAX) 10 MG suppository Place 10 mg rectally as needed for moderate constipation.    [provider]  bismuth subsalicylate (PEPTO-BISMOL) 262 MG/15ML suspension Take 30 mLs by mouth daily as needed for indigestion. 05/30/19   [provider]  Cholecalciferol (VITAMIN D3 PO) Take by mouth. Take 1 tablet weekly for 3 weeks then 2 in the fours week    [provider]  Cyanocobalamin (VITAMIN B 12 PO) Take 5,000 mcg by mouth daily.    [provider]  desoximetasone (TOPICORT) 0.25 % cream Apply 1 application topically 2 (two) times daily as needed (lichen planus on legs).     [provider]  dicyclomine (BENTYL) 10 MG capsule Take 1 capsule (10 mg total) by mouth 2 (two) times daily as needed for spasms. 11/18/20   Thornton Park, MD  diltiazem (CARDIZEM) 30 MG tablet TAKE 1 TABLET (30 MG TOTAL) BY MOUTH EVERY 6 (SIX) HOURS AS NEEDED (PALPITATIONS). 01/07/20   Burnell Blanks, MD  docusate sodium (STOOL SOFTENER) 100 MG capsule Take 1 capsule (100 mg total) by mouth 2 (two) times daily. Patient taking differently: Take 100 mg by mouth as needed for mild constipation. 11/18/20   Thornton Park, MD  ENSURE PLUS (ENSURE PLUS) LIQD Take 350 mLs by mouth daily.     [provider]  ergocalciferol (VITAMIN D2) 50000 UNITS capsule Take 50,000 Units by mouth See admin instructions. Takes one capsule on weeks 1 through 3 of each month on Saturdays and then one capsule on week 4 on Mondays and Thursdays. (for a total of 5 capsules per month).    [provider]  escitalopram (LEXAPRO) 10 MG tablet Take 10 mg by mouth daily.    [provider]  escitalopram (LEXAPRO) 5 MG tablet Take 5 mg by mouth daily.    [provider]  esomeprazole (NEXIUM) 20 MG capsule Take 20 mg by mouth daily as needed (heartburn).    [provider]  Evolocumab (REPATHA SURECLICK) 401 MG/ML SOAJ Inject into the skin.    [provider]  famotidine (PEPCID) 20 MG tablet Take 1 tablet (20 mg total) by mouth 2 (two) times daily. 12/24/20   Thornton Park, MD  fluticasone (FLONASE) 50 MCG/ACT nasal spray Place 1 spray into both nostrils daily as needed for allergies.     [provider]  meclizine (ANTIVERT) 25 MG tablet Take 1 tablet (25 mg total) by mouth daily as needed for dizziness or nausea. 09/16/15   Burnell Blanks, MD  Multiple Vitamins-Minerals (CENTRUM SILVER 50+WOMEN) TABS Take 1 capsule by mouth daily. 07/15/19   [provider]  polyethylene glycol powder (GLYCOLAX/MIRALAX) 17 GM/SCOOP powder Take 17 g by mouth as needed.    [provider]  pravastatin (PRAVACHOL) 20 MG tablet Take 40 mg by mouth at bedtime.    [provider]  psyllium (METAMUCIL) 0.52 g capsule Take 1.04 g by mouth daily.    [provider]      Allergies    Penicillins, Sulfonamide derivatives, Boniva [ibandronic acid], Zocor [simvastatin], Flagyl [metronidazole], Iodinated contrast media, and Prednisone    Review of Systems   Review of Systems  Constitutional:  Negative for chills and fever.  HENT:  Negative for sore throat.   Eyes:  Negative for redness.  Respiratory:  Negative for shortness of breath.   Cardiovascular:  Positive for palpitations. Negative for chest pain and leg swelling.  Gastrointestinal:  Negative for abdominal pain, diarrhea and vomiting.  Genitourinary:  Negative for flank pain.  Musculoskeletal:  Negative for back pain and neck pain.  Skin:  Negative for rash.  Neurological:  Negative for syncope.  Hematological:  Does not bruise/bleed easily.  Psychiatric/Behavioral:  Negative for confusion.    Physical Exam Updated Vital Signs BP (!) 160/102 (BP Location: Left Arm)    Pulse (!) 102    Temp (!) 96.8 F (36 C) (Temporal)    Resp 16    Ht 1.575 m (5\' 2" )    Wt 65 kg    LMP  (LMP Unknown)    SpO2 96%    BMI 26.21 kg/m  Physical Exam Vitals and nursing note reviewed.  Constitutional:      Appearance: Normal appearance. She is well-developed.  HENT:     Head: Atraumatic.     Nose: Nose normal.     Mouth/Throat:     Mouth: Mucous membranes are moist.  Eyes:     General: No scleral  icterus.    Conjunctiva/sclera: Conjunctivae normal.  Neck:     Trachea: No tracheal deviation.     Comments: Thyroid not grossly enlarged or tender. Cardiovascular:     Rate and Rhythm: Normal rate and regular rhythm.     Pulses: Normal pulses.     Heart sounds: Normal heart sounds. No murmur heard.   No friction rub. No gallop.  Pulmonary:     Effort: Pulmonary effort is normal. No respiratory distress.     Breath sounds: Normal breath sounds.  Abdominal:     General: There is no distension.     Tenderness: There is no abdominal tenderness.  Genitourinary:    Comments: No cva tenderness.  Musculoskeletal:        General: No swelling  or tenderness.     Cervical back: Normal range of motion and neck supple. No rigidity. No muscular tenderness.     Right lower leg: No edema.     Left lower leg: No edema.  Skin:    General: Skin is warm and dry.     Findings: No rash.  Neurological:     Mental Status: She is alert.     Comments: Alert, speech normal.   Psychiatric:        Mood and Affect: Mood normal.    ED Results / Procedures / Treatments   Labs (all labs ordered are listed, but only abnormal results are displayed) Results for orders placed or performed during the hospital encounter of 69/67/89  Basic metabolic panel  Result Value Ref Range   Sodium 143 135 - 145 mmol/L   Potassium 3.7 3.5 - 5.1 mmol/L   Chloride 105 98 - 111 mmol/L   CO2 26 22 - 32 mmol/L   Glucose, Bld 135 (H) 70 - 99 mg/dL   BUN 19 8 - 23 mg/dL   Creatinine, Ser 0.89 0.44 - 1.00 mg/dL   Calcium 9.6 8.9 - 10.3 mg/dL   GFR, Estimated >60 >60 mL/min   Anion gap 12 5 - 15  CBC  Result Value Ref Range   WBC 7.3 4.0 - 10.5 K/uL   RBC 4.86 3.87 - 5.11 MIL/uL   Hemoglobin 14.9 12.0 - 15.0 g/dL   HCT 44.7 36.0 - 46.0 %   MCV 92.0 80.0 - 100.0 fL   MCH 30.7 26.0 - 34.0 pg   MCHC 33.3 30.0 - 36.0 g/dL   RDW 13.0 11.5 - 15.5 %   Platelets 248 150 - 400 K/uL   nRBC 0.0 0.0 - 0.2 %  Troponin I (High  Sensitivity)  Result Value Ref Range   Troponin I (High Sensitivity) 6 <18 ng/L   DG Chest 2 View  Result Date: 03/02/2021 CLINICAL DATA:  Palpitations EXAM: CHEST - 2 VIEW COMPARISON:  08/03/2018 FINDINGS: Cardiac size is within normal limits. Small patchy density in the medial left lower lung fields adjacent to the cardiac margin has not changed. There are no signs of pulmonary edema or focal pulmonary consolidation. Increase in AP diameter of chest suggests COPD. There is no pleural effusion or pneumothorax. IMPRESSION: There are no focal infiltrates or signs of pulmonary edema. Electronically Signed   By: Elmer Picker M.D.   On: 03/02/2021 16:19    EKG EKG Interpretation  Date/Time:  Tuesday March 02 2021 15:44:57 EST Ventricular Rate:  99 PR Interval:  172 QRS Duration: 84 QT Interval:  362 QTC Calculation: 464 R Axis:   -42 Text Interpretation: Sinus rhythm with frequent Premature ventricular complexes Left axis deviation Confirmed by Lajean Saver [phone]) on 03/02/2021 3:55:48 PM  Radiology DG Chest 2 View  Result Date: 03/02/2021 CLINICAL DATA:  Palpitations EXAM: CHEST - 2 VIEW COMPARISON:  08/03/2018 FINDINGS: Cardiac size is within normal limits. Small patchy density in the medial left lower lung fields adjacent to the cardiac margin has not changed. There are no signs of pulmonary edema or focal pulmonary consolidation. Increase in AP diameter of chest suggests COPD. There is no pleural effusion or pneumothorax. IMPRESSION: There are no focal infiltrates or signs of pulmonary edema. Electronically Signed   By: Elmer Picker M.D.   On: 03/02/2021 16:19    Procedures Procedures    Medications Ordered in ED Medications - No data to display  ED Course/ Medical  Decision Making/ A&P                           Medical Decision Making  Iv ns. Continuous pulse ox and cardiac monitoring. Stat labs. Ecg.   Diff dx includes pvc, svt, afib, vt, and other serious  dysrhythmias - considered in workup.   Reviewed nursing notes and prior charts for additional history. External reports reviewed.  Additional hx from patient/records provided.   Labs reviewed/interpreted by me - chem normal, trop normal.  Cardiac monitor - occasional pvcs, sins rhythm, rate 90.   CXR reviewed/interpreted by me - no pna.   K normal, but to lower end, will give kcl po.   Pt also may take one of her diltiazem as previously instructed by her doctors.   Pt appears stable for d/c,   Return precautions provided.         Final Clinical Impression(s) / ED Diagnoses Final diagnoses:  None    Rx / DC Orders ED Discharge Orders     None         Lajean Saver, MD 03/02/21 1656

## 2021-03-02 NOTE — ED Triage Notes (Signed)
Patient here POV from Home with Palpitations.  Patient states she has been having Palpitations since yesterday. She took Diltiazem for Palpitations and it subsided. Patient then began to have Symptoms again today and came for Evaluation.  Patient takes Diltiazem as Needed for Symptoms.   No N/V/D. Patient feels a Pressure in her Chest at Times.  NAD Noted during Triage. A&Ox4. GCS 15. Ambulatory.

## 2021-03-02 NOTE — Discharge Instructions (Addendum)
It was our pleasure to provide your ER care today - we hope that you feel better.  Overall, your lab work looks good - your potassium is normal, but to the lower end of the normal range.    Take your diltiazem as need, as prescribed, by your doctor.   Follow up with your doctor in the next 1-2 weeks.   Return to ER if worse, new symptoms, fainting, chest pain, increased trouble breathing, or other concern.

## 2021-03-08 ENCOUNTER — Encounter: Payer: Self-pay | Admitting: Physician Assistant

## 2021-03-08 NOTE — Progress Notes (Signed)
Cardiology Office Note    Date:  03/10/2021   ID:  [name], DOB [birth year], MRN [id number]  PCP:  Crist Infante, MD  Cardiologist:  Lauree Chandler, MD  Electrophysiologist:  None   Chief Complaint: f/u PVCs  History of Present Illness:   [name] is a 81 y.o. female with history of aortic insufficiency (last trivial), moderate mitral regurgitation, severe anxiety, PVCs, HLD (managed in primary care), HTN, fibromyalgia, multiple GI issues including diverticulosis, PUD, GERD, aortic atherosclerosis on CT, remote TIA (manifested by vision loss) who is seen for follow-up. She historically followed with Dr. Angelena Form for PVCs demonstrated on monitors in 2011 and 2015. Metoprolol was previously changed to diltiazem due to dizziness. She has a history of this, requiring meclizine. She had previously seen Dr. Caryl Comes without additional recommendations (AAD could be considered in the future). Nuc in 2015 was normal. She had a syncopal event in August 2021 after leaning over to pick up her dog's bowl. Echo September 2021 showed EF 60-65% with grade 1 diastolic dysfunction, no significant AI. 30 day monitor September 2021 showed sinus w/ PVCs. Carotid study 10/2019 showed no significant plaque. Last echo 04/2020 EF 55%, grade 1 DD, mild LAE, moderate MR, trivial AI with recommendation to repeat in 1 year. Dr. Angelena Form has previously felt that anxiety has played a role in her exacerbation of symptoms.  She returns for post-ED evaluation of PVCs. She reports that historically diltiazem has done a good job of helping control her PVCs (along with deep breathing) but she recently had breakthrough palpitations 03/02/21 for which she sought care in the ED. She was found to have occasional PVCs on monitoring, no overt lab abnormalities. She has had 1 or 2 other episodes since the ED visit but not as symptomatic. She also continues to report periodic dizziness primarily with position changes.  This improves with meclizine. She states her PCP has previously encouraged adequate hydration (has smart cup to remind her) and compression hose (which she's gotten away from wearing recently). She is feeling well today.   Labwork independently reviewed: 02/2020 troponin negative, CBC wnl, K 3.7, Cr 0.89 11/2020 LFTs wnl 10/2020 TSH wnl kPN 11/2019 LDL 99, HDL 56, trig 132  Cardiology Studies:   Studies reviewed are outlined and summarized above. Reports included below if pertinent.   Echo 04/2020  1. Left ventricular ejection fraction, by estimation, is 55%. The left  ventricle has normal function. The left ventricle has no regional wall  motion abnormalities. Left ventricular diastolic parameters are consistent  with Grade I diastolic dysfunction  (impaired relaxation). The average left ventricular global longitudinal  strain is -20.5 %. The global longitudinal strain is normal.   2. Right ventricular systolic function is normal. The right ventricular  size is normal. There is normal pulmonary artery systolic pressure.   3. Left atrial size was mildly dilated.   4. The mitral valve is normal in structure. Moderate mitral valve  regurgitation. No evidence of mitral stenosis. Moderate mitral annular  calcification.   5. The aortic valve is normal in structure. Aortic valve regurgitation is  trivial. No aortic stenosis is present.   6. The inferior vena cava is normal in size with greater than 50%  respiratory variability, suggesting right atrial pressure of 3 mmHg.   Comparison(s): No significant change from prior study. 10/30/19 EF 60-65%.   Last monitor 11/2019 Sinus rhythm with frequent PVCs (1% burden).   Carotid 2021   Summary:  Right Carotid: The  extracranial vessels were near-normal with only minimal  wall                 thickening or plaque.   Left Carotid: There was no evidence of thrombus, dissection,  atherosclerotic                plaque or stenosis in the  cervical carotid system.   Vertebrals:  Bilateral vertebral arteries demonstrate antegrade flow.  Subclavians: Normal flow hemodynamics were seen in bilateral subclavian               arteries.   *See table(s) above for measurements and observations.   Nuc 2015 Overall Impression:  Normal stress nuclear study.   LV Ejection Fraction: 61%.  LV Wall Motion:  Normal Wall Motion   Pixie Casino, MD, [facility] Board Certified in Nuclear Cardiology Attending Cardiologist [place]    Past Medical History:  Diagnosis Date   Allergy    Anxiety    Aortic insufficiency    Back abscess    Carotid stenosis    not seen on repeat study 2021   Cataract    just watching - bilateral   Diverticulitis    GERD (gastroesophageal reflux disease)    History of kidney stones    first stone now   History of syncope    Echocardiogram 9/21: EF 60-65, no RWMA, GR 1 DD, normal RVSF, RVSP 20.5   Hx of adenomatous colonic polyps    Hyperlipidemia    Hypertension    IBS (irritable bowel syndrome)    Lichen planus    Mitral regurgitation    Osteoporosis    PUD (peptic ulcer disease)    PVC (premature ventricular contraction)    Stroke (HCC)    Thyroid disease    TIA (transient ischemic attack) summer 2015    Past Surgical History:  Procedure Laterality Date   ABDOMINAL HYSTERECTOMY     complete   APPENDECTOMY     CARPAL TUNNEL RELEASE     left   COLONOSCOPY     polyps   CYSTOSCOPY WITH RETROGRADE PYELOGRAM, URETEROSCOPY AND STENT PLACEMENT Left 07/06/2015   Procedure: CYSTOSCOPY WITH LEFT RETROGRADE PYELOGRAM AND INTERPRETATION, LEFT URETEROSCOPY WITH DUAL LUMEN PYELOSCOPY, IDENTIFCATION AND DILATION OF URETERAL STRICTURE, JJ STENT REMOVAL, BASKET EXTRACTION OF RENAL PELVIS STONE, EXTRACTION OF ORGANIZED RENAL PELVIC BLOOD CLOT;  Surgeon: Carolan Clines, MD;  Location: [facility];  Service: Urology;  Laterality: Left;  PLACEMENT OF LEFT DOUBLE J STEN    CYSTOSCOPY/RETROGRADE/URETEROSCOPY/STONE EXTRACTION WITH BASKET Left 05/08/2015   Procedure: CYSTOSCOPY/RETROGRADE/URETEROSCOPY/JJ STENT PLACEMENT;  Surgeon: Carolan Clines, MD;  Location: [facility];  Service: Urology;  Laterality: Left;   WRIST SURGERY     left    Current Medications: Current Meds  Medication Sig   acetaminophen (TYLENOL) 500 MG tablet Take 500-1,000 mg by mouth every 6 (six) hours as needed for moderate pain.   alum & mag hydroxide-simeth (MYLANTA MAXIMUM STRENGTH) 400-400-40 MG/5ML suspension Take 10 mLs by mouth every 6 (six) hours as needed for indigestion.   aspirin EC 81 MG tablet Take 1 tablet (81 mg total) by mouth daily.   bifidobacterium infantis (ALIGN) capsule Take 1 capsule by mouth daily.   bisacodyl (DULCOLAX) 10 MG suppository Place 10 mg rectally as needed for moderate constipation.   bismuth subsalicylate (PEPTO-BISMOL) 262 MG/15ML suspension Take 30 mLs by mouth daily as needed for indigestion.   Cholecalciferol (VITAMIN D3 PO) Take by mouth. Take 1 tablet weekly for 3 weeks  then 2 in the fours week   Cyanocobalamin (VITAMIN B 12 PO) Take 5,000 mcg by mouth daily.   desoximetasone (TOPICORT) 0.25 % cream Apply 1 application topically 2 (two) times daily as needed (lichen planus on legs).    dicyclomine (BENTYL) 10 MG capsule Take 1 capsule (10 mg total) by mouth 2 (two) times daily as needed for spasms.   diltiazem (CARDIZEM) 30 MG tablet TAKE 1 TABLET (30 MG TOTAL) BY MOUTH EVERY 6 (SIX) HOURS AS NEEDED (PALPITATIONS).   docusate sodium (STOOL SOFTENER) 100 MG capsule Take 1 capsule (100 mg total) by mouth 2 (two) times daily. (Patient taking differently: Take 100 mg by mouth as needed for mild constipation.)   ENSURE PLUS (ENSURE PLUS) LIQD Take 350 mLs by mouth daily.    escitalopram (LEXAPRO) 10 MG tablet Take 10 mg by mouth daily.   escitalopram (LEXAPRO) 5 MG tablet Take 5 mg by mouth daily.   esomeprazole (NEXIUM) 20 MG capsule Take 20 mg by mouth  daily as needed (heartburn).   Evolocumab (REPATHA SURECLICK) 825 MG/ML SOAJ Inject into the skin.   fluticasone (FLONASE) 50 MCG/ACT nasal spray Place 1 spray into both nostrils daily as needed for allergies.   meclizine (ANTIVERT) 25 MG tablet Take 1 tablet (25 mg total) by mouth daily as needed for dizziness or nausea.   Multiple Vitamins-Minerals (CENTRUM SILVER 50+WOMEN) TABS Take 1 capsule by mouth daily.   polyethylene glycol powder (GLYCOLAX/MIRALAX) 17 GM/SCOOP powder Take 17 g by mouth as needed.   pravastatin (PRAVACHOL) 20 MG tablet Take 40 mg by mouth at bedtime.   psyllium (METAMUCIL) 0.52 g capsule Take 1.04 g by mouth daily.      Allergies:   Penicillins, Sulfonamide derivatives, Boniva [ibandronic acid], Zocor [simvastatin], Flagyl [metronidazole], Iodinated contrast media, and Prednisone   Social History   Socioeconomic History   Marital status: Widowed    Spouse name: Not on file   Number of children: Not on file   Years of education: Not on file   Highest education level: Not on file  Occupational History   Not on file  Tobacco Use   Smoking status: Never   Smokeless tobacco: Never  Vaping Use   Vaping Use: Never used  Substance and Sexual Activity   Alcohol use: No   Drug use: No   Sexual activity: Not on file    Comment: Hysterectomy  Other Topics Concern   Not on file  Social History Narrative   Patient lives alone and has no children or relatives in [place]   Social Determinants of Health   Financial Resource Strain: Not on file  Food Insecurity: Not on file  Transportation Needs: Not on file  Physical Activity: Not on file  Stress: Not on file  Social Connections: Not on file     Family History:  The patient's family history includes Atrial fibrillation in her brother; Cancer in her father, paternal grandmother, and another family member; Colon cancer (age of onset: 77) in her paternal grandmother; Diabetes in her brother; Glaucoma in her  mother; Heart attack in her father; Heart attack (age of onset: 84) in her maternal grandfather; Hypertension in her mother; Lung cancer in her brother and father; Macular degeneration in her mother; Non-Hodgkin's lymphoma in her brother; Stroke in her mother; Tuberculosis in her paternal grandfather. There is no history of Breast cancer, Colon polyps, Esophageal cancer, Rectal cancer, or Stomach cancer.  ROS:   Please see the history of present illness. All  other systems are reviewed and otherwise negative.    EKG(s)/Additional Labs   EKG:  EKG is not ordered but reviewed 03/02/21 NSR 99bpm, frequent PVCs, left axis deviation, poor R wave progression. Morphology of R wave progression similar to prior I.e. 2021.  Recent Labs: 11/10/2020: TSH 2.41 12/18/2020: ALT 17 03/02/2021: BUN 19; Creatinine, Ser 0.89; Hemoglobin 14.9; Platelets 248; Potassium 3.7; Sodium 143  Recent Lipid Panel No results found for: CHOL, TRIG, HDL, CHOLHDL, VLDL, LDLCALC, LDLDIRECT  PHYSICAL EXAM:    VS:  BP 120/62    Pulse (!) 56    Ht 5\' 1"  (1.549 m)    Wt 148 lb (67.1 kg)    LMP  (LMP Unknown)    SpO2 95%    BMI 27.96 kg/m   BMI: Body mass index is 27.96 kg/m.  GEN: Well nourished, well developed female in no acute distress HEENT: normocephalic, atraumatic Neck: no JVD, carotid bruits, or masses Cardiac: RRR; occasional ectopy (less than EKG), no murmurs, rubs, or gallops, no edema  Respiratory:  clear to auscultation bilaterally, normal work of breathing GI: soft, nontender, nondistended, + BS MS: no deformity or atrophy Skin: warm and dry, no rash Neuro:  Alert and Oriented x 3, Strength and sensation are intact, follows commands Psych: euthymic mood, full affect  Wt Readings from Last 3 Encounters:  03/10/21 148 lb (67.1 kg)  03/02/21 143 lb 4.8 oz (65 kg)  01/29/21 143 lb 4 oz (65 kg)     ASSESSMENT & PLAN:   1. Palpitations with known hx of PVCs, intermittent dizziness - per our discussion, we'll  undertake a 2 week Zio to quantify burden and number of episodes to help guide next steps. She feels this length would be necessary to ensure we capture the episodes. She also has history of intermittent dizziness and this will allow [place] to evaluate for any arrhythmogenic causes. I checked her orthostatic standing BP today and it actually went up (129/70). Per shared decision making, we'll hold off on any med changes pending result of the monitor but encouraged to continue diltiazem PRN and call for any sustained episodes at which time we can consider consolidating to long acting or a re-trial of beta blocker therapy (was on metoprolol 25mg  BID per remote records). Will also repeat echo given prior mitral regurgitation. Her K was slightly below goal in the ED recently. We'll update lab panel to include BMET, Mg, TSH as well as CBC given the dizziness. If there is any significant bradycardia on her monitor we may need to consider referring back to EP to reconsider antiarrhythmic based on % burden.  2. Moderate MR, trivial AI by prior echo - recheck echocardiogram.  3. Essential HTN - controlled, no changes made today.  4. Aortic atherosclerosis with HLD - lipids are managed in primary care. She is on PCSK9i once a month through Dr. Joylene Draft and also on pravastatin. She is not sure if she is on 20mg  or 40mg  dose but she will notify our nurse tomorrow to update her record when we call her with non-lipid lab results. She is not fasting today so did not draw lipids but has f/u with Dr. Joylene Draft next month per her report for follow-up.    Disposition: F/u with myself in March 2023, sooner if testing dictates.   Medication Adjustments/Labs and Tests Ordered: Current medicines are reviewed at length with the patient today.  Concerns regarding medicines are outlined above. Medication changes, Labs and Tests ordered today are summarized above  and listed in the Patient Instructions accessible in Encounters.    Signed, Charlie Pitter, PA-C  03/10/2021 3:29 PM    [place] Phone: [phone]; Fax: [phone]

## 2021-03-10 ENCOUNTER — Encounter: Payer: Self-pay | Admitting: Physician Assistant

## 2021-03-10 ENCOUNTER — Ambulatory Visit (INDEPENDENT_AMBULATORY_CARE_PROVIDER_SITE_OTHER): Payer: Medicare PPO

## 2021-03-10 ENCOUNTER — Ambulatory Visit: Payer: Medicare PPO | Admitting: Physician Assistant

## 2021-03-10 ENCOUNTER — Other Ambulatory Visit: Payer: Self-pay

## 2021-03-10 VITALS — BP 120/62 | HR 56 | Ht 61.0 in | Wt 148.0 lb

## 2021-03-10 DIAGNOSIS — R002 Palpitations: Secondary | ICD-10-CM

## 2021-03-10 DIAGNOSIS — R42 Dizziness and giddiness: Secondary | ICD-10-CM

## 2021-03-10 DIAGNOSIS — I10 Essential (primary) hypertension: Secondary | ICD-10-CM | POA: Diagnosis not present

## 2021-03-10 DIAGNOSIS — I340 Nonrheumatic mitral (valve) insufficiency: Secondary | ICD-10-CM

## 2021-03-10 DIAGNOSIS — I700 Atherosclerosis of aorta: Secondary | ICD-10-CM

## 2021-03-10 DIAGNOSIS — I351 Nonrheumatic aortic (valve) insufficiency: Secondary | ICD-10-CM

## 2021-03-10 DIAGNOSIS — I493 Ventricular premature depolarization: Secondary | ICD-10-CM

## 2021-03-10 NOTE — Patient Instructions (Addendum)
Medication Instructions:  Your physician recommends that you continue on your current medications as directed. Please refer to the Current Medication list given to you today. Please confirm the dose of the Pravastatin when I call with your lab results tomorrow :)  *If you need a refill on your cardiac medications before your next appointment, please call your pharmacy*   Lab Work: TODAY:  BMET, MAG, CBC, & TSH   If you have labs (blood work) drawn today and your tests are completely normal, you will receive your results only by: [facility] (if you have MyChart) OR A paper copy in the mail If you have any lab test that is abnormal or we need to change your treatment, we will call you to review the results.   Testing/Procedures: Your physician has requested that you have an echocardiogram. Echocardiography is a painless test that uses sound waves to create images of your heart. It provides your doctor with information about the size and shape of your heart and how well your hearts chambers and valves are working. This procedure takes approximately one hour. There are no restrictions for this procedure.   ZIO XT- Long Term Monitor Instructions  Your physician has requested you wear a ZIO patch monitor for 14 days.  This is a single patch monitor. [employer] supplies one patch monitor per enrollment. Additional stickers are not available. Please do not apply patch if you will be having a Nuclear Stress Test,  Echocardiogram, Cardiac CT, MRI, or Chest Xray during the period you would be wearing the  monitor. The patch cannot be worn during these tests. You cannot remove and re-apply the  ZIO XT patch monitor.  Your ZIO patch monitor will be mailed 3 day USPS to your address on file. It may take 3-5 days  to receive your monitor after you have been enrolled.  Once you have received your monitor, please review the enclosed instructions. Your monitor  has already been registered assigning a  specific monitor serial # to you.  Billing and Patient Assistance Program Information  We have supplied [employer] with any of your insurance information on file for billing purposes. [employer] offers a sliding scale Patient Assistance Program for patients that do not have  insurance, or whose insurance does not completely cover the cost of the ZIO monitor.  You must apply for the Patient Assistance Program to qualify for this discounted rate.  To apply, please call [employer] at [phone], select option 4, select option 2, ask to apply for  Patient Assistance Program. Theodore Demark will ask your household income, and how many people  are in your household. They will quote your out-of-pocket cost based on that information.  [employer] will also be able to set up a 35-month, interest-free payment plan if needed.  Applying the monitor   Shave hair from upper left chest.  Hold abrader disc by orange tab. Rub abrader in 40 strokes over the upper left chest as  indicated in your monitor instructions.  Clean area with 4 enclosed alcohol pads. Let dry.  Apply patch as indicated in monitor instructions. Patch will be placed under collarbone on left  side of chest with arrow pointing upward.  Rub patch adhesive wings for 2 minutes. Remove white label marked "1". Remove the white  label marked "2". Rub patch adhesive wings for 2 additional minutes.  While looking in a mirror, press and release button in center of patch. A small green light will  flash 3-4 times. This will be  your only indicator that the monitor has been turned on.  Do not shower for the first 24 hours. You may shower after the first 24 hours.  Press the button if you feel a symptom. You will hear a small click. Record Date, Time and  Symptom in the Patient Logbook.  When you are ready to remove the patch, follow instructions on the last 2 pages of Patient  Logbook. Stick patch monitor onto the last page of Patient Logbook.  Place Patient  Logbook in the blue and white box. Use locking tab on box and tape box closed  securely. The blue and white box has prepaid postage on it. Please place it in the mailbox as  soon as possible. Your physician should have your test results approximately 7 days after the  monitor has been mailed back to [facility].  Call [facility] at [phone] if you have questions regarding  your ZIO XT patch monitor. Call them immediately if you see an orange light blinking on your  monitor.  If your monitor falls off in less than 4 days, contact our Monitor department at [phone].  If your monitor becomes loose or falls off after 4 days call [employer] at [phone] for  suggestions on securing your monitor    Follow-Up: At [facility], you and your health needs are our priority.  As part of our continuing mission to provide you with exceptional heart care, we have created designated Provider Care Teams.  These Care Teams include your primary Cardiologist (physician) and Advanced Practice Providers (APPs -  Physician Assistants and Nurse Practitioners) who all work together to provide you with the care you need, when you need it.  We recommend signing up for the patient portal called "MyChart".  Sign up information is provided on this After Visit Summary.  MyChart is used to connect with patients for Virtual Visits (Telemedicine).  Patients are able to view lab/test results, encounter notes, upcoming appointments, etc.  Non-urgent messages can be sent to your provider as well.   To learn more about what you can do with MyChart, go to [email or web address].    Your next appointment:   05/14/21 ARRIVE AT 11:00   The format for your next appointment:   In Person  Provider:   Melina Copa, PA-C   Other Instructions

## 2021-03-10 NOTE — Progress Notes (Unsigned)
Enrolled for [employer] to mail a ZIO XT long term holter monitor to the patients address on file.  

## 2021-03-11 LAB — CBC
Hematocrit: 43.4 % (ref 34.0–46.6)
Hemoglobin: 14.7 g/dL (ref 11.1–15.9)
MCH: 31.5 pg (ref 26.6–33.0)
MCHC: 33.9 g/dL (ref 31.5–35.7)
MCV: 93 fL (ref 79–97)
Platelets: 239 10*3/uL (ref 150–450)
RBC: 4.67 x10E6/uL (ref 3.77–5.28)
RDW: 12.1 % (ref 11.7–15.4)
WBC: 6.9 10*3/uL (ref 3.4–10.8)

## 2021-03-11 LAB — BASIC METABOLIC PANEL
BUN/Creatinine Ratio: 18 (ref 12–28)
BUN: 15 mg/dL (ref 8–27)
CO2: 24 mmol/L (ref 20–29)
Calcium: 9 mg/dL (ref 8.7–10.3)
Chloride: 107 mmol/L — ABNORMAL HIGH (ref 96–106)
Creatinine, Ser: 0.83 mg/dL (ref 0.57–1.00)
Glucose: 88 mg/dL (ref 70–99)
Potassium: 4.2 mmol/L (ref 3.5–5.2)
Sodium: 145 mmol/L — ABNORMAL HIGH (ref 134–144)
eGFR: 71 mL/min/{1.73_m2} (ref >59)

## 2021-03-11 LAB — MAGNESIUM: Magnesium: 2.5 mg/dL — ABNORMAL HIGH (ref 1.6–2.3)

## 2021-03-11 LAB — TSH: TSH: 2.24 u[IU]/mL (ref 0.450–4.500)

## 2021-03-12 ENCOUNTER — Telehealth: Payer: Self-pay | Admitting: *Deleted

## 2021-03-12 NOTE — Telephone Encounter (Signed)
-----   Message from [name], [place] sent at 03/11/2021  8:57 AM EST ----- Please inform patient that labs overall reassuring. Sodium level, chloride level and magnesium level are marginally elevated suggestive of dehydration so increase fluid intake like we talked about. If she is taking any magnesium supplements would hold off - but if not taking any would just increase fluid intake/stay well hydrated. Continue plan as discussed.

## 2021-03-12 NOTE — Telephone Encounter (Signed)
Call placed to pt regarding lab results.  Updated medication list.

## 2021-03-15 DIAGNOSIS — I340 Nonrheumatic mitral (valve) insufficiency: Secondary | ICD-10-CM | POA: Diagnosis not present

## 2021-03-15 DIAGNOSIS — R002 Palpitations: Secondary | ICD-10-CM

## 2021-03-15 DIAGNOSIS — R42 Dizziness and giddiness: Secondary | ICD-10-CM | POA: Diagnosis not present

## 2021-03-15 DIAGNOSIS — I700 Atherosclerosis of aorta: Secondary | ICD-10-CM

## 2021-03-15 DIAGNOSIS — I10 Essential (primary) hypertension: Secondary | ICD-10-CM

## 2021-03-15 DIAGNOSIS — I351 Nonrheumatic aortic (valve) insufficiency: Secondary | ICD-10-CM

## 2021-03-15 DIAGNOSIS — I493 Ventricular premature depolarization: Secondary | ICD-10-CM

## 2021-03-26 DIAGNOSIS — I129 Hypertensive chronic kidney disease with stage 1 through stage 4 chronic kidney disease, or unspecified chronic kidney disease: Secondary | ICD-10-CM | POA: Diagnosis not present

## 2021-03-26 DIAGNOSIS — I491 Atrial premature depolarization: Secondary | ICD-10-CM | POA: Diagnosis not present

## 2021-03-26 DIAGNOSIS — M810 Age-related osteoporosis without current pathological fracture: Secondary | ICD-10-CM | POA: Diagnosis not present

## 2021-03-26 DIAGNOSIS — E785 Hyperlipidemia, unspecified: Secondary | ICD-10-CM | POA: Diagnosis not present

## 2021-03-26 DIAGNOSIS — R2681 Unsteadiness on feet: Secondary | ICD-10-CM | POA: Diagnosis not present

## 2021-03-26 DIAGNOSIS — I493 Ventricular premature depolarization: Secondary | ICD-10-CM | POA: Diagnosis not present

## 2021-03-26 DIAGNOSIS — G459 Transient cerebral ischemic attack, unspecified: Secondary | ICD-10-CM | POA: Diagnosis not present

## 2021-03-26 DIAGNOSIS — E8940 Asymptomatic postprocedural ovarian failure: Secondary | ICD-10-CM | POA: Diagnosis not present

## 2021-03-26 DIAGNOSIS — R7301 Impaired fasting glucose: Secondary | ICD-10-CM | POA: Diagnosis not present

## 2021-04-05 ENCOUNTER — Telehealth: Payer: Self-pay | Admitting: Physician Assistant

## 2021-04-05 DIAGNOSIS — I493 Ventricular premature depolarization: Secondary | ICD-10-CM | POA: Diagnosis not present

## 2021-04-05 DIAGNOSIS — I340 Nonrheumatic mitral (valve) insufficiency: Secondary | ICD-10-CM | POA: Diagnosis not present

## 2021-04-05 DIAGNOSIS — I351 Nonrheumatic aortic (valve) insufficiency: Secondary | ICD-10-CM | POA: Diagnosis not present

## 2021-04-05 DIAGNOSIS — R002 Palpitations: Secondary | ICD-10-CM | POA: Diagnosis not present

## 2021-04-05 MED ORDER — DILTIAZEM HCL ER COATED BEADS 120 MG PO CP24: 120.0000 mg | ORAL_CAPSULE | Freq: Every day | ORAL | 3 refills | Status: AC

## 2021-04-05 NOTE — Telephone Encounter (Signed)
I spoke w the patient.  She was upset an said she has been crying for the last 2 hours because she Googled most of her monitor report.  We discussed that she is having a lot of PVCS and to start Cardizem CD 120 once a day and it is ok if she still needs a short acting tablet occasionally too but that she hopefully will notice the palpitations much less.  She is aware of plan for EP consult and is fine seeing Dr. Caryl Comes or one of his partners. Her last in EP was 2015.    She knows of echo appointment in 2 days.  Thanked me for calling and appreciative for information provided.

## 2021-04-05 NOTE — Telephone Encounter (Signed)
Follow Up:     Patient said she wore a Monitor and received a print out today with her results. She said she did not understand it. She needs somebody to please explain the results.bN

## 2021-04-05 NOTE — Telephone Encounter (Signed)
Appreciate the update and reassurance you provided to the patient. She was equally as concerned about her finding of trivial AI seen on a prior echocardiogram. I am hopeful the medication changes will help. I will be sure to send a result note on her echo as soon as I view it.

## 2021-04-05 NOTE — Telephone Encounter (Signed)
-----   Message from Charlie Pitter, [place] sent at 04/05/2021  2:00 PM EST ----- I agree with all of the above. Xaine Sansom, can you assist with letting the patient know? I will look out for her echo this week. Thank you! ----- Message ----- From: Burnell Blanks, MD Sent: 04/05/2021   1:48 PM EST To: Charlie Pitter, PA-C, Rodman Key, RN  Naomi Fitton, She saw Lisbeth Renshaw recently and wore the monitor to assess PVC burden. She is having lots of PVCs (11.2% burden). Short runs of VT. Longest 7 beats. Her lowest heart rate is 49 bpm in the early am while sleeping. I think we should start Cardizem CD 120 mg daily and maybe get her back in to see EP. She had seen Caryl Comes in the past. Any other thoughts Dayna?   Gerald Stabs

## 2021-04-07 ENCOUNTER — Other Ambulatory Visit: Payer: Self-pay

## 2021-04-07 ENCOUNTER — Ambulatory Visit (HOSPITAL_COMMUNITY): Payer: Medicare PPO | Attending: Internal Medicine

## 2021-04-07 DIAGNOSIS — I351 Nonrheumatic aortic (valve) insufficiency: Secondary | ICD-10-CM | POA: Diagnosis not present

## 2021-04-07 DIAGNOSIS — R42 Dizziness and giddiness: Secondary | ICD-10-CM | POA: Diagnosis not present

## 2021-04-07 DIAGNOSIS — I10 Essential (primary) hypertension: Secondary | ICD-10-CM | POA: Insufficient documentation

## 2021-04-07 DIAGNOSIS — R002 Palpitations: Secondary | ICD-10-CM | POA: Insufficient documentation

## 2021-04-07 DIAGNOSIS — I340 Nonrheumatic mitral (valve) insufficiency: Secondary | ICD-10-CM | POA: Diagnosis not present

## 2021-04-07 DIAGNOSIS — I493 Ventricular premature depolarization: Secondary | ICD-10-CM | POA: Diagnosis not present

## 2021-04-07 DIAGNOSIS — I700 Atherosclerosis of aorta: Secondary | ICD-10-CM | POA: Diagnosis not present

## 2021-04-07 LAB — ECHOCARDIOGRAM COMPLETE
Area-P 1/2: 5.14 cm2
S' Lateral: 2.6 cm

## 2021-04-09 ENCOUNTER — Telehealth: Payer: Self-pay | Admitting: *Deleted

## 2021-04-09 DIAGNOSIS — I493 Ventricular premature depolarization: Secondary | ICD-10-CM

## 2021-04-09 DIAGNOSIS — R002 Palpitations: Secondary | ICD-10-CM

## 2021-04-09 NOTE — Telephone Encounter (Signed)
-----   Message from Charlie Pitter, [place] sent at 04/08/2021 12:17 PM EST ----- Please let pt know overall echo looked normal except they had a hard time seeing the outline of the inside of the heart to assess the wall motion. Patient tends to be quite anxious (see recent phone note from [facility]) so tell her this does not specifically represent a MEDICAL problem but more of an imaging limitation. Lets get a limited echo with definity contrast as suggested in the echo report to ensure that left ventricle looks good. No significant valve leaking seen at this time.

## 2021-04-20 ENCOUNTER — Ambulatory Visit (HOSPITAL_COMMUNITY): Payer: Medicare PPO | Attending: Internal Medicine

## 2021-04-20 ENCOUNTER — Other Ambulatory Visit: Payer: Self-pay

## 2021-04-20 DIAGNOSIS — I493 Ventricular premature depolarization: Secondary | ICD-10-CM | POA: Diagnosis not present

## 2021-04-20 DIAGNOSIS — R002 Palpitations: Secondary | ICD-10-CM

## 2021-04-20 DIAGNOSIS — I10 Essential (primary) hypertension: Secondary | ICD-10-CM

## 2021-04-20 LAB — ECHOCARDIOGRAM LIMITED
Area-P 1/2: 2.4 cm2
S' Lateral: 3.2 cm

## 2021-04-20 MED ORDER — PERFLUTREN LIPID MICROSPHERE
2.0000 mL | INTRAVENOUS | Status: AC | PRN
  Administered 2021-04-20: 2 mL via INTRAVENOUS

## 2021-04-21 NOTE — Progress Notes (Signed)
Pt has been made aware of normal result and verbalized understanding.  jw

## 2021-05-19 DIAGNOSIS — M810 Age-related osteoporosis without current pathological fracture: Secondary | ICD-10-CM | POA: Diagnosis not present

## 2021-06-07 ENCOUNTER — Institutional Professional Consult (permissible substitution): Payer: Medicare PPO | Admitting: Neurology

## 2021-06-07 ENCOUNTER — Encounter: Payer: Self-pay | Admitting: Internal Medicine

## 2021-06-07 ENCOUNTER — Ambulatory Visit: Payer: Medicare PPO | Admitting: Internal Medicine

## 2021-06-07 VITALS — BP 130/76 | HR 74 | Ht 61.0 in | Wt 152.6 lb

## 2021-06-07 DIAGNOSIS — I493 Ventricular premature depolarization: Secondary | ICD-10-CM

## 2021-06-07 NOTE — Patient Instructions (Addendum)
Medication Instructions:  ?Your physician recommends that you continue on your current medications as directed. Please refer to the Current Medication list given to you today. ? ?Labwork: ?None ordered. ? ?Testing/Procedures: ?None ordered. ? ?Follow-Up: ?Your physician wants you to follow-up in: as needed with Cristopher Peru, MD  ? ? ? ?Any Other Special Instructions Will Be Listed Below (If Applicable). ? ?If you need a refill on your cardiac medications before your next appointment, please call your pharmacy.  ? ?Important Information About Sugar ? ? ? ? ? ? ? ?

## 2021-06-07 NOTE — Progress Notes (Signed)
? ? ? ? ?HPI ?[name] is referred by Dr. Angelena Form for evaluation of PVC's. She is a pleasant 81 yo widowed woman with HTN. She has taken cardizem under the direction of Dr. Angelena Form. She notes mild palpitations. She has not had syncope. She denies chest pain. She wore a cardiac monitor and had 11% PVC's. Her main complaint was orthostatic symptoms where she gets dizzy when she bends over. She thinks that deep breathing when she experiences palpitations helps the symptoms improve. ?Allergies  ?Allergen Reactions  ? Penicillins Swelling  ?  By second day there was swelling of tongue and could not breathe Has patient had a PCN reaction causing immediate rash, facial/tongue/throat swelling, SOB or lightheadedness with hypotension: yes ?Has patient had a PCN reaction causing severe rash involving mucus membranes or skin necrosis: no ?Has patient had a PCN reaction that required hospitalization no ?Has patient had a PCN reaction occurring within the last 10 years:no ?If all of the above answers are "NO", then may proceed with Cephalosporin use  ? Sulfonamide Derivatives Hives and Rash  ?  Where applied.  ? Boniva [Ibandronic Acid]   ?  Other reaction(s): ? caused aches and pains. ?Other reaction(s): ? caused aches and pains.  ? Penicillamine   ?  Other reaction(s): Unknown  ? Sulfa Antibiotics   ?  Other reaction(s): Unknown  ? Zocor [Simvastatin] Other (See Comments)  ?  unknown  ? Flagyl [Metronidazole] Diarrhea and Other (See Comments)  ?  Hyper, decreases appetite ?Other reaction(s): Unknown  ? Iodinated Contrast Media Other (See Comments)  ?  Unknown allergy to unknown contrast during gb test many yrs ago//a.[email or web address]  ? Prednisone Other (See Comments)  ?  Due to cataract on eye and history of PVC's  ? ? ? ?Current Outpatient Medications  ?Medication Sig Dispense Refill  ? acetaminophen (TYLENOL) 500 MG tablet Take 500-1,000 mg by mouth every 6 (six) hours as needed for moderate pain.    ? alum & mag  hydroxide-simeth (MYLANTA MAXIMUM STRENGTH) 400-400-40 MG/5ML suspension Take 10 mLs by mouth every 6 (six) hours as needed for indigestion.    ? aspirin EC 81 MG tablet Take 1 tablet (81 mg total) by mouth daily. 30 tablet 3  ? bifidobacterium infantis (ALIGN) capsule Take 1 capsule by mouth daily.    ? bisacodyl (DULCOLAX) 10 MG suppository Place 10 mg rectally as needed for moderate constipation.    ? bismuth subsalicylate (PEPTO-BISMOL) 262 MG/15ML suspension Take 30 mLs by mouth daily as needed for indigestion.    ? Cyanocobalamin (VITAMIN B 12 PO) Take 5,000 mcg by mouth daily.    ? desoximetasone (TOPICORT) 0.25 % cream Apply 1 application topically 2 (two) times daily as needed (lichen planus on legs).     ? dicyclomine (BENTYL) 10 MG capsule Take 1 capsule (10 mg total) by mouth 2 (two) times daily as needed for spasms.    ? diltiazem (CARDIZEM CD) 120 MG 24 hr capsule Take 1 capsule (120 mg total) by mouth daily. 90 capsule 3  ? diltiazem (CARDIZEM) 30 MG tablet TAKE 1 TABLET (30 MG TOTAL) BY MOUTH EVERY 6 (SIX) HOURS AS NEEDED (PALPITATIONS). 120 tablet 3  ? docusate sodium (STOOL SOFTENER) 100 MG capsule Take 1 capsule (100 mg total) by mouth 2 (two) times daily. (Patient taking differently: Take 100 mg by mouth as needed for mild constipation.) 10 capsule 0  ? ENSURE PLUS (ENSURE PLUS) LIQD Take 350 mLs by mouth daily.     ?  ergocalciferol (VITAMIN D2) 1.25 MG (50000 UT) capsule 50,000 Units. Take 1 tablet by mouth weekly on weeks 1-3, take 2 tablet by mouth on the 4th week    ? escitalopram (LEXAPRO) 10 MG tablet Take 10 mg by mouth daily.    ? escitalopram (LEXAPRO) 5 MG tablet Take 5 mg by mouth daily.    ? esomeprazole (NEXIUM) 20 MG capsule Take 20 mg by mouth daily as needed (heartburn).    ? Evolocumab (REPATHA SURECLICK) 341 MG/ML SOAJ Inject into the skin.    ? fluticasone (FLONASE) 50 MCG/ACT nasal spray Place 1 spray into both nostrils daily as needed for allergies.    ? meclizine (ANTIVERT)  25 MG tablet Take 1 tablet (25 mg total) by mouth daily as needed for dizziness or nausea. 90 tablet 3  ? Multiple Vitamins-Minerals (CENTRUM SILVER 50+WOMEN) TABS Take 1 capsule by mouth daily.    ? polyethylene glycol powder (GLYCOLAX/MIRALAX) 17 GM/SCOOP powder Take 17 g by mouth as needed.    ? pravastatin (PRAVACHOL) 40 MG tablet Take 40 mg by mouth daily.    ? psyllium (METAMUCIL) 0.52 g capsule Take 1.04 g by mouth daily.    ? ?No current facility-administered medications for this visit.  ? ? ? ?Past Medical History:  ?Diagnosis Date  ? Allergy   ? Anxiety   ? Aortic insufficiency   ? Back abscess   ? Carotid stenosis   ? not seen on repeat study 2021  ? Cataract   ? just watching - bilateral  ? Diverticulitis   ? GERD (gastroesophageal reflux disease)   ? History of kidney stones   ? first stone now  ? History of syncope   ? Echocardiogram 9/21: EF 60-65, no RWMA, GR 1 DD, normal RVSF, RVSP 20.5  ? Hx of adenomatous colonic polyps   ? Hyperlipidemia   ? Hypertension   ? IBS (irritable bowel syndrome)   ? Lichen planus   ? Mitral regurgitation   ? Osteoporosis   ? PUD (peptic ulcer disease)   ? PVC (premature ventricular contraction)   ? Thyroid disease   ? TIA (transient ischemic attack) summer 2015  ? ? ?ROS: ? ? All systems reviewed and negative except as noted in the HPI. ? ? ?Past Surgical History:  ?Procedure Laterality Date  ? ABDOMINAL HYSTERECTOMY    ? complete  ? APPENDECTOMY    ? CARPAL TUNNEL RELEASE    ? left  ? COLONOSCOPY    ? polyps  ? CYSTOSCOPY WITH RETROGRADE PYELOGRAM, URETEROSCOPY AND STENT PLACEMENT Left 07/06/2015  ? Procedure: CYSTOSCOPY WITH LEFT RETROGRADE PYELOGRAM AND INTERPRETATION, LEFT URETEROSCOPY WITH DUAL LUMEN PYELOSCOPY, IDENTIFCATION AND DILATION OF URETERAL STRICTURE, JJ STENT REMOVAL, BASKET EXTRACTION OF RENAL PELVIS STONE, EXTRACTION OF ORGANIZED RENAL PELVIC BLOOD CLOT;  Surgeon: Carolan Clines, MD;  Location: [facility];  Service: Urology;  Laterality: Left;   PLACEMENT OF LEFT DOUBLE J STEN  ? CYSTOSCOPY/RETROGRADE/URETEROSCOPY/STONE EXTRACTION WITH BASKET Left 05/08/2015  ? Procedure: CYSTOSCOPY/RETROGRADE/URETEROSCOPY/JJ STENT PLACEMENT;  Surgeon: Carolan Clines, MD;  Location: [facility];  Service: Urology;  Laterality: Left;  ? WRIST SURGERY    ? left  ? ? ? ?Family History  ?Problem Relation Age of Onset  ? Glaucoma Mother   ? Macular degeneration Mother   ? Stroke Mother   ? Hypertension Mother   ? Lung cancer Father   ? Heart attack Father   ? Cancer Father   ? Lung cancer Brother   ? Non-Hodgkin's lymphoma  Brother   ? Diabetes Brother   ? Atrial fibrillation Brother   ? Tuberculosis Paternal Grandfather   ? Colon cancer Paternal Grandmother 22  ? Cancer Paternal Grandmother   ? Heart attack Maternal Grandfather 72  ? Cancer Other   ? Breast cancer Neg Hx   ? Colon polyps Neg Hx   ? Esophageal cancer Neg Hx   ? Rectal cancer Neg Hx   ? Stomach cancer Neg Hx   ? ? ? ?Social History  ? ?Socioeconomic History  ? Marital status: Widowed  ?  Spouse name: Not on file  ? Number of children: Not on file  ? Years of education: Not on file  ? Highest education level: Not on file  ?Occupational History  ? Not on file  ?Tobacco Use  ? Smoking status: Never  ? Smokeless tobacco: Never  ?Vaping Use  ? Vaping Use: Never used  ?Substance and Sexual Activity  ? Alcohol use: No  ? Drug use: No  ? Sexual activity: Not on file  ?  Comment: Hysterectomy  ?Other Topics Concern  ? Not on file  ?Social History Narrative  ? Patient lives alone and has no children or relatives in [place]  ? ?Social Determinants of Health  ? ?Financial Resource Strain: Not on file  ?Food Insecurity: Not on file  ?Transportation Needs: Not on file  ?Physical Activity: Not on file  ?Stress: Not on file  ?Social Connections: Not on file  ?Intimate Partner Violence: Not on file  ? ? ? ?BP 130/76   Pulse 74   Ht '5\' 1"'$  (1.549 m)   Wt 152 lb 9.6 oz (69.2 kg)   LMP  (LMP Unknown)   SpO2 97%   BMI 28.83  kg/m?  ? ?Physical Exam: ? ?Well appearing NAD ?HEENT: Unremarkable ?Neck:  No JVD, no thyromegally ?Lymphatics:  No adenopathy ?Back:  No CVA tenderness ? ?Lungs:  Clear with no wheezes ?HEART:  Regular rate rhythm, no murmurs, no

## 2021-06-09 ENCOUNTER — Institutional Professional Consult (permissible substitution): Payer: Medicare PPO | Admitting: Neurology

## 2021-06-10 DIAGNOSIS — H52223 Regular astigmatism, bilateral: Secondary | ICD-10-CM | POA: Diagnosis not present

## 2021-06-10 DIAGNOSIS — H2513 Age-related nuclear cataract, bilateral: Secondary | ICD-10-CM | POA: Diagnosis not present

## 2021-06-10 DIAGNOSIS — H524 Presbyopia: Secondary | ICD-10-CM | POA: Diagnosis not present

## 2021-06-10 DIAGNOSIS — H5203 Hypermetropia, bilateral: Secondary | ICD-10-CM | POA: Diagnosis not present

## 2021-06-12 ENCOUNTER — Encounter: Payer: Self-pay | Admitting: Physician Assistant

## 2021-06-12 NOTE — Progress Notes (Signed)
? ?Cardiology Office Note   ? ?Date:  06/14/2021  ? ?ID:  [name], DOB 13-Dec-1940, MRN [id number] ? ?PCP:  Crist Infante, MD  ?Cardiologist:  Lauree Chandler, MD  ?Electrophysiologist:  None  ? ?Chief Complaint: f/u PVCs ? ?History of Present Illness:  ? ?[name] is a 81 y.o. female with history of frequent PVCs and NSVT, insignificant AI/MR by last studies, HLD (managed in primary care), HTN, severe anxiety including surrounding health concerns, fibromyalgia, multiple GI issues including diverticulosis, PUD, GERD, aortic atherosclerosis on CT, remote TIA (manifested by vision loss), chronic dizziness who is seen for follow-up.  ? ?She historically followed with Dr. Angelena Form for PVCs going back to monitor in 2011 and 2015. Metoprolol was previously changed to diltiazem due to dizziness (she has a history of this, requiring meclizine). She had previously seen Dr. Caryl Comes without additional recommendations. Nuc in 2015 was normal. She had a syncopal event in August 2021 after leaning over to pick up her dog's bowl and had unrevealing echo and 30-day monitor showing sinus w/ PVCs and short runs NSVT. Carotid study 10/2019 showed no significant plaque. A f/u echo in 2022 showed mild AI and moderate MR. Fortunately more recent study 03/2021 showed EF 55-60%, trivial MR, no AI. Dr. Angelena Form has previously felt that anxiety has played a role in her exacerbation of symptoms.  ? ?At last visit earlier this year she was having more palpitations and event monitor showed 11.2% PVCs, 155 NSVT runs (longest 7 beats), 1 run SVT, average HR 70bpm. Per d/w Dr. Angelena Form we started Cardizem back and referred to EP. Echo with follow-up limited study confirmed normal LV function without significant valvular disease. She saw Dr. Lovena Le who offered AAD but she did declined. She reports she is doing much, much better on the diltiazem and feels great. The palpitations have subsided. Her chronic fleeting dizziness has  not worsened on the medication. She was able to participate in extensive yard cleanup at a family home in [place] a few weeks ago without any chest pain or dyspnea. No falls. ? ? ? ?Labwork independently reviewed: ?02/2021 TSH wnl, CBC wnl, Mg 2.5, K 4.2, Cr 0.83 ?11/2020 LFTs wnl ?10/2020 TSH wnl ?kPN 11/2019 LDL 99, HDL 56, trig 132 ? ? ?Cardiology Studies:  ? ?Studies reviewed are outlined and summarized above. Reports included below if pertinent.  ? ?Limited echo 04/20/21 ? ? 1. Limited study. LVEF appears normal 55-60%. Frequent PVCs.  ? ?2D echo 04/07/21 ? ? ? 1. Poor acoustic windows. Unable to see endocardium Would recomm limited  ?echo with Definity to help define LVEF and regional wall motion. . Left  ?ventricular ejection fraction, by estimation, is 55 to 60%. The left  ?ventricle has normal function.  ? 2. Right ventricular systolic function is normal. The right ventricular  ?size is normal. There is normal pulmonary artery systolic pressure.  ? 3. The mitral valve is normal in structure. Trivial mitral valve  ?regurgitation.  ? 4. The aortic valve is normal in structure. Aortic valve regurgitation is  ?not visualized.  ? 5. The inferior vena cava is normal in size with greater than 50%  ?respiratory variability, suggesting right atrial pressure of 3 mmHg.  ? ?Monitor 04/05/21 ?Sinus rhythm. Patient had a min HR of 49 bpm, max HR of 214 bpm, and avg HR of 70 bpm.  ?Frequent premature ventricular contractions (11.2% burden).  ?155 Ventricular Tachycardia runs occurred, the run with the fastest interval lasting 5 beats  with  ?a max rate of 214 bpm, the longest lasting 7 beats with an avg rate of 109 bpm. .VE Couplets were occasional (1.5%, 10023), and VE Triplets were rare (<1.0%, 395). Ventricular Bigeminy and Trigeminy were present. ?1 run of Supraventricular Tachycardia occurred lasting 16 beats with a max rate of 121 bpm (avg 115 bpm)  ? ? ?Past Medical History:  ?Diagnosis Date  ? Allergy   ? Anxiety    ? Aortic insufficiency   ? Back abscess   ? Carotid stenosis   ? not seen on repeat study 2021  ? Cataract   ? just watching - bilateral  ? Diverticulitis   ? Frequent PVCs   ? GERD (gastroesophageal reflux disease)   ? History of kidney stones   ? first stone now  ? History of syncope   ? Echocardiogram 9/21: EF 60-65, no RWMA, GR 1 DD, normal RVSF, RVSP 20.5  ? Hx of adenomatous colonic polyps   ? Hyperlipidemia   ? Hypertension   ? IBS (irritable bowel syndrome)   ? Lichen planus   ? Mitral regurgitation   ? NSVT (nonsustained ventricular tachycardia) ([place])   ? Osteoporosis   ? PUD (peptic ulcer disease)   ? Thyroid disease   ? TIA (transient ischemic attack) summer 2015  ? ? ?Past Surgical History:  ?Procedure Laterality Date  ? ABDOMINAL HYSTERECTOMY    ? complete  ? APPENDECTOMY    ? CARPAL TUNNEL RELEASE    ? left  ? COLONOSCOPY    ? polyps  ? CYSTOSCOPY WITH RETROGRADE PYELOGRAM, URETEROSCOPY AND STENT PLACEMENT Left 07/06/2015  ? Procedure: CYSTOSCOPY WITH LEFT RETROGRADE PYELOGRAM AND INTERPRETATION, LEFT URETEROSCOPY WITH DUAL LUMEN PYELOSCOPY, IDENTIFCATION AND DILATION OF URETERAL STRICTURE, JJ STENT REMOVAL, BASKET EXTRACTION OF RENAL PELVIS STONE, EXTRACTION OF ORGANIZED RENAL PELVIC BLOOD CLOT;  Surgeon: Carolan Clines, MD;  Location: [facility];  Service: Urology;  Laterality: Left;  PLACEMENT OF LEFT DOUBLE J STEN  ? CYSTOSCOPY/RETROGRADE/URETEROSCOPY/STONE EXTRACTION WITH BASKET Left 05/08/2015  ? Procedure: CYSTOSCOPY/RETROGRADE/URETEROSCOPY/JJ STENT PLACEMENT;  Surgeon: Carolan Clines, MD;  Location: [facility];  Service: Urology;  Laterality: Left;  ? WRIST SURGERY    ? left  ? ? ?Current Medications: ?Current Meds  ?Medication Sig  ? acetaminophen (TYLENOL) 500 MG tablet Take 500-1,000 mg by mouth every 6 (six) hours as needed for moderate pain.  ? alum & mag hydroxide-simeth (MYLANTA MAXIMUM STRENGTH) 400-400-40 MG/5ML suspension Take 10 mLs by mouth every 6 (six) hours as needed for  indigestion.  ? aspirin EC 81 MG tablet Take 1 tablet (81 mg total) by mouth daily.  ? bifidobacterium infantis (ALIGN) capsule Take 1 capsule by mouth daily.  ? bisacodyl (DULCOLAX) 10 MG suppository Place 10 mg rectally as needed for moderate constipation.  ? bismuth subsalicylate (PEPTO-BISMOL) 262 MG/15ML suspension Take 30 mLs by mouth daily as needed for indigestion.  ? Cyanocobalamin (VITAMIN B 12 PO) Take 5,000 mcg by mouth daily.  ? desoximetasone (TOPICORT) 0.25 % cream Apply 1 application topically 2 (two) times daily as needed (lichen planus on legs).   ? dicyclomine (BENTYL) 10 MG capsule Take 1 capsule (10 mg total) by mouth 2 (two) times daily as needed for spasms.  ? diltiazem (CARDIZEM CD) 120 MG 24 hr capsule Take 1 capsule (120 mg total) by mouth daily.  ? diltiazem (CARDIZEM) 30 MG tablet TAKE 1 TABLET (30 MG TOTAL) BY MOUTH EVERY 6 (SIX) HOURS AS NEEDED (PALPITATIONS).  ? docusate sodium (STOOL SOFTENER) 100  MG capsule Take 1 capsule (100 mg total) by mouth 2 (two) times daily. (Patient taking differently: Take 100 mg by mouth as needed for mild constipation.)  ? ENSURE PLUS (ENSURE PLUS) LIQD Take 350 mLs by mouth daily.   ? ergocalciferol (VITAMIN D2) 1.25 MG (50000 UT) capsule 50,000 Units. Take 1 tablet by mouth weekly on weeks 1-3, take 2 tablet by mouth on the 4th week  ? escitalopram (LEXAPRO) 10 MG tablet Take 10 mg by mouth daily.  ? escitalopram (LEXAPRO) 5 MG tablet Take 5 mg by mouth daily.  ? esomeprazole (NEXIUM) 20 MG capsule Take 20 mg by mouth daily as needed (heartburn).  ? Evolocumab (REPATHA SURECLICK) 852 MG/ML SOAJ Inject into the skin every 30 (thirty) days.  ? fluticasone (FLONASE) 50 MCG/ACT nasal spray Place 1 spray into both nostrils daily as needed for allergies.  ? meclizine (ANTIVERT) 25 MG tablet Take 1 tablet (25 mg total) by mouth daily as needed for dizziness or nausea.  ? Multiple Vitamins-Minerals (CENTRUM SILVER 50+WOMEN) TABS Take 1 capsule by mouth daily.   ? polyethylene glycol powder (GLYCOLAX/MIRALAX) 17 GM/SCOOP powder Take 17 g by mouth as needed.  ? pravastatin (PRAVACHOL) 40 MG tablet Take 40 mg by mouth daily.  ? psyllium (METAMUCIL) 0.52 g capsule Take

## 2021-06-14 ENCOUNTER — Ambulatory Visit: Payer: Medicare PPO | Admitting: Physician Assistant

## 2021-06-14 ENCOUNTER — Encounter: Payer: Self-pay | Admitting: Physician Assistant

## 2021-06-14 VITALS — BP 136/78 | HR 68 | Ht 61.0 in | Wt 153.0 lb

## 2021-06-14 DIAGNOSIS — R42 Dizziness and giddiness: Secondary | ICD-10-CM

## 2021-06-14 DIAGNOSIS — I700 Atherosclerosis of aorta: Secondary | ICD-10-CM

## 2021-06-14 DIAGNOSIS — I493 Ventricular premature depolarization: Secondary | ICD-10-CM | POA: Diagnosis not present

## 2021-06-14 DIAGNOSIS — I4729 Other ventricular tachycardia: Secondary | ICD-10-CM

## 2021-06-14 DIAGNOSIS — E785 Hyperlipidemia, unspecified: Secondary | ICD-10-CM

## 2021-06-14 NOTE — Patient Instructions (Signed)
Medication Instructions:  ?Your physician recommends that you continue on your current medications as directed. Please refer to the Current Medication list given to you today. ?*If you need a refill on your cardiac medications before your next appointment, please call your pharmacy* ? ? ?Lab Work: ?None Ordered ? ? ?Testing/Procedures: ?None Ordered ? ? ?Follow-Up: ?At [facility], you and your health needs are our priority.  As part of our continuing mission to provide you with exceptional heart care, we have created designated Provider Care Teams.  These Care Teams include your primary Cardiologist (physician) and Advanced Practice Providers (APPs -  Physician Assistants and Nurse Practitioners) who all work together to provide you with the care you need, when you need it. ? ?We recommend signing up for the patient portal called "MyChart".  Sign up information is provided on this After Visit Summary.  MyChart is used to connect with patients for Virtual Visits (Telemedicine).  Patients are able to view lab/test results, encounter notes, upcoming appointments, etc.  Non-urgent messages can be sent to your provider as well.   ?To learn more about what you can do with MyChart, go to [email or web address].   ? ?Your next appointment:   ?6 month(s) ? ?The format for your next appointment:   ?In Person ? ?Provider:   ?Melina Copa, PA ? ? ? ? ?Other Instructions ? ? ?Important Information About Sugar ? ? ? ? ?  ?

## 2021-06-30 DIAGNOSIS — M25551 Pain in right hip: Secondary | ICD-10-CM | POA: Diagnosis not present

## 2021-06-30 DIAGNOSIS — M810 Age-related osteoporosis without current pathological fracture: Secondary | ICD-10-CM | POA: Diagnosis not present

## 2021-06-30 DIAGNOSIS — G459 Transient cerebral ischemic attack, unspecified: Secondary | ICD-10-CM | POA: Diagnosis not present

## 2021-06-30 DIAGNOSIS — E785 Hyperlipidemia, unspecified: Secondary | ICD-10-CM | POA: Diagnosis not present

## 2021-06-30 DIAGNOSIS — R7301 Impaired fasting glucose: Secondary | ICD-10-CM | POA: Diagnosis not present

## 2021-06-30 DIAGNOSIS — I10 Essential (primary) hypertension: Secondary | ICD-10-CM | POA: Diagnosis not present

## 2021-06-30 DIAGNOSIS — R2681 Unsteadiness on feet: Secondary | ICD-10-CM | POA: Diagnosis not present

## 2021-07-13 DIAGNOSIS — C44519 Basal cell carcinoma of skin of other part of trunk: Secondary | ICD-10-CM | POA: Diagnosis not present

## 2021-07-13 DIAGNOSIS — L820 Inflamed seborrheic keratosis: Secondary | ICD-10-CM | POA: Diagnosis not present

## 2021-07-15 ENCOUNTER — Encounter: Payer: Self-pay | Admitting: Neurology

## 2021-07-15 ENCOUNTER — Ambulatory Visit (INDEPENDENT_AMBULATORY_CARE_PROVIDER_SITE_OTHER): Payer: Medicare PPO | Admitting: Neurology

## 2021-07-15 VITALS — BP 117/71 | HR 69 | Ht 61.0 in | Wt 155.0 lb

## 2021-07-15 DIAGNOSIS — E663 Overweight: Secondary | ICD-10-CM

## 2021-07-15 DIAGNOSIS — G479 Sleep disorder, unspecified: Secondary | ICD-10-CM

## 2021-07-15 DIAGNOSIS — I4729 Other ventricular tachycardia: Secondary | ICD-10-CM

## 2021-07-15 DIAGNOSIS — G4719 Other hypersomnia: Secondary | ICD-10-CM | POA: Diagnosis not present

## 2021-07-15 DIAGNOSIS — R0683 Snoring: Secondary | ICD-10-CM

## 2021-07-15 NOTE — Patient Instructions (Signed)
Thank you for choosing [facility] for your sleep related care! It was nice to meet you today!   Here is what we discussed today:    Based on your symptoms and your exam I believe you are at risk for obstructive sleep apnea (aka OSA). We should proceed with a sleep study to determine whether you do or do not have OSA and how severe it is. Even, if you have mild OSA, I may want you to consider treatment with CPAP, as treatment of even borderline or mild sleep apnea can result and improvement of symptoms such as sleep disruption, daytime sleepiness, nighttime bathroom breaks, restless leg symptoms, improvement of headache syndromes, even improved mood disorder.   As discussed, we will proceed with a home sleep test at your convenience, once your blister on your chest has healed.  As explained, an attended sleep study (meaning you get to stay overnight in the sleep lab), lets [place] monitor sleep-related behaviors such as sleep talking and leg movements in sleep, in addition to monitoring for sleep apnea.  A home sleep test is a screening tool for sleep apnea diagnosis only, but unfortunately, does not help with any other sleep-related diagnoses.  Please remember, the long-term risks and ramifications of untreated moderate to severe obstructive sleep apnea may include (but are not limited to): increased risk for cardiovascular disease, including congestive heart failure, stroke, difficult to control hypertension, treatment resistant obesity, arrhythmias, especially irregular heartbeat commonly known as A. Fib. (atrial fibrillation); even type 2 diabetes has been linked to untreated OSA.   Other correlations that untreated obstructive sleep apnea include macular edema which is swelling of the retina in the eyes, droopy eyelid syndrome, and elevated hemoglobin and hematocrit levels (often referred to as polycythemia).  Sleep apnea can cause disruption of sleep and sleep deprivation in most  cases, which, in turn, can cause recurrent headaches, problems with memory, mood, concentration, focus, and vigilance. Most people with untreated sleep apnea report excessive daytime sleepiness, which can affect their ability to drive. Please do not drive or use heavy equipment or machinery, if you feel sleepy! Patients with sleep apnea can also develop difficulty initiating and maintaining sleep (aka insomnia).   Having sleep apnea may increase your risk for other sleep disorders, including involuntary behaviors sleep such as sleep terrors, sleep talking, sleepwalking.    Having sleep apnea can also increase your risk for restless leg syndrome and leg movements at night.   Please note that untreated obstructive sleep apnea may carry additional perioperative morbidity. Patients with significant obstructive sleep apnea (typically, in the moderate to severe degree) should receive, if possible, perioperative PAP (positive airway pressure) therapy and the surgeons and particularly the anesthesiologists should be informed of the diagnosis and the severity of the sleep disordered breathing.   We will call you or email you through [place] with regards to your test results and plan a follow-up in sleep clinic accordingly. Most likely, you will hear from one of our nurses.   Our sleep lab administrative assistant will call you to schedule your sleep study and give you further instructions, regarding the check in process for the sleep study, arrival time, what to bring, when you can expect to leave after the study, etc., and to answer any other logistical questions you may have. If you don't hear back from her by about 2 weeks from now, please feel free to call her direct line at [phone] or you can call our general clinic number, or  email [place] through My Chart.

## 2021-07-15 NOTE — Progress Notes (Signed)
Subjective:    Patient ID: [name] is a 81 y.o. female.  HPI    Star Age, MD, PhD [facility] [address], [address].O. [place], [place] [address]  Dear Dr. Joylene Draft,  I saw your patient, [name], upon your kind request in my sleep clinic today for initial consultation of her sleep disorder, in particular, concern for underlying obstructive sleep apnea.  The patient is unaccompanied today.  As you know, [name] is an 81 year old right-handed woman with an underlying complex medical history of aortic insufficiency, carotid artery stenosis, PVCs, NSVT, mitral regurgitation, osteoporosis, irritable bowel syndrome, history of diverticulitis, history of kidney stone, hypertension, hyperlipidemia, history of TIA, thyroid disease, vitamin D deficiency, and mildly overweight state, who reports snoring and excessive daytime somnolence.  I reviewed your office note from 03/26/2021.  Her Epworth sleepiness score is 3 out of 24, fatigue severity score is 21 out of 63.  She reports that she was having trouble with daytime somnolence some months ago but she has since then improved her schedule and tries to make a more set time for sleep.  She feels that this has really helped her.  She currently goes to bed around 10:30 PM.  She is asleep between 11 PM and 11:30 PM.  Her TV in her family room broke so she watches TV in her bedroom but turns it off before falling asleep.  Her rise time is around 8 AM.  She does not have night to night nocturia or recurrent morning headaches.  She has a history of palpitations and takes verapamil daily long-acting and short acting low-dose as needed.  She has allergy flare from time to time and has had some sinus pressure from that.  She has no obvious family history of sleep apnea but would be willing to get tested.  She is widowed, her husband died in 81 and she has no children.  She lives alone, she has 1 dog in the  household.  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  She does not drink any caffeine on a day-to-day basis, she does not currently drink any alcohol and she is a non-smoker.  Her Past Medical History Is Significant For: Past Medical History:  Diagnosis Date   Allergy    Anxiety    Aortic insufficiency    Back abscess    Carotid stenosis    not seen on repeat study 2021   Cataract    just watching - bilateral   Diverticulitis    Frequent PVCs    GERD (gastroesophageal reflux disease)    History of kidney stones    first stone now   History of syncope    Echocardiogram 9/21: EF 60-65, no RWMA, GR 1 DD, normal RVSF, RVSP 20.5   Hx of adenomatous colonic polyps    Hyperlipidemia    Hypertension    IBS (irritable bowel syndrome)    Lichen planus    Mitral regurgitation    NSVT (nonsustained ventricular tachycardia) (HCC)    Osteoporosis    PUD (peptic ulcer disease)    Thyroid disease    TIA (transient ischemic attack) summer 2015    Her Past Surgical History Is Significant For: Past Surgical History:  Procedure Laterality Date   ABDOMINAL HYSTERECTOMY     complete   APPENDECTOMY     CARPAL TUNNEL RELEASE     left   COLONOSCOPY     polyps   CYSTOSCOPY WITH RETROGRADE PYELOGRAM,  URETEROSCOPY AND STENT PLACEMENT Left 07/06/2015   Procedure: CYSTOSCOPY WITH LEFT RETROGRADE PYELOGRAM AND INTERPRETATION, LEFT URETEROSCOPY WITH DUAL LUMEN PYELOSCOPY, IDENTIFCATION AND DILATION OF URETERAL STRICTURE, JJ STENT REMOVAL, BASKET EXTRACTION OF RENAL PELVIS STONE, EXTRACTION OF ORGANIZED RENAL PELVIC BLOOD CLOT;  Surgeon: Carolan Clines, MD;  Location: [facility];  Service: Urology;  Laterality: Left;  PLACEMENT OF LEFT DOUBLE J STEN   CYSTOSCOPY/RETROGRADE/URETEROSCOPY/STONE EXTRACTION WITH BASKET Left 05/08/2015   Procedure: CYSTOSCOPY/RETROGRADE/URETEROSCOPY/JJ STENT PLACEMENT;  Surgeon: Carolan Clines, MD;  Location: [facility];  Service: Urology;  Laterality: Left;   WRIST SURGERY      left    Her Family History Is Significant For: Family History  Problem Relation Age of Onset   Glaucoma Mother    Macular degeneration Mother    Stroke Mother    Hypertension Mother    Cancer - Lung Father    Lung cancer Father    Heart attack Father    Cancer Father    Lung cancer Brother    Non-Hodgkin's lymphoma Brother    Diabetes Brother    Atrial fibrillation Brother    Cancer - Lung Brother    Heart attack Maternal Grandfather 72   Colon cancer Paternal Grandmother 72   Cancer Paternal Grandmother    Tuberculosis Paternal Grandfather    Cancer Other    Breast cancer Neg Hx    Colon polyps Neg Hx    Esophageal cancer Neg Hx    Rectal cancer Neg Hx    Stomach cancer Neg Hx     Her Social History Is Significant For: Social History   Socioeconomic History   Marital status: Widowed    Spouse name: Not on file   Number of children: Not on file   Years of education: Not on file   Highest education level: Not on file  Occupational History   Not on file  Tobacco Use   Smoking status: Never   Smokeless tobacco: Never  Vaping Use   Vaping Use: Never used  Substance and Sexual Activity   Alcohol use: No   Drug use: No   Sexual activity: Not on file    Comment: Hysterectomy  Other Topics Concern   Not on file  Social History Narrative   Patient lives alone and has no children or relatives in [place]   Right handed   Caffeine: none   Social Determinants of Health   Financial Resource Strain: Not on file  Food Insecurity: Not on file  Transportation Needs: Not on file  Physical Activity: Not on file  Stress: Not on file  Social Connections: Not on file    Her Allergies Are:  Allergies  Allergen Reactions   Penicillins Swelling    By second day there was swelling of tongue and could not breathe Has patient had a PCN reaction causing immediate rash, facial/tongue/throat swelling, SOB or lightheadedness with hypotension: yes Has patient had a PCN  reaction causing severe rash involving mucus membranes or skin necrosis: no Has patient had a PCN reaction that required hospitalization no Has patient had a PCN reaction occurring within the last 10 years:no If all of the above answers are "NO", then may proceed with Cephalosporin use   Sulfonamide Derivatives Hives and Rash    Where applied.   Boniva [Ibandronic Acid]     Other reaction(s): ? caused aches and pains. Other reaction(s): ? caused aches and pains.   Penicillamine     Other reaction(s): Unknown   Sulfa Antibiotics  Other reaction(s): Unknown   Zocor [Simvastatin] Other (See Comments)    unknown   Flagyl [Metronidazole] Diarrhea and Other (See Comments)    Hyper, decreases appetite Other reaction(s): Unknown   Iodinated Contrast Media Other (See Comments)    Unknown allergy to unknown contrast during gb test many yrs ago//a.[email or web address]   Prednisone Other (See Comments)    Due to cataract on eye and history of PVC's  :   Her Current Medications Are:  Outpatient Encounter Medications as of 07/15/2021  Medication Sig   acetaminophen (TYLENOL) 500 MG tablet Take 500-1,000 mg by mouth every 6 (six) hours as needed for moderate pain.   alum & mag hydroxide-simeth (MYLANTA MAXIMUM STRENGTH) 400-400-40 MG/5ML suspension Take 10 mLs by mouth every 6 (six) hours as needed for indigestion.   aspirin EC 81 MG tablet Take 1 tablet (81 mg total) by mouth daily.   bifidobacterium infantis (ALIGN) capsule Take 1 capsule by mouth daily.   bisacodyl (DULCOLAX) 10 MG suppository Place 10 mg rectally as needed for moderate constipation.   bismuth subsalicylate (PEPTO-BISMOL) 262 MG/15ML suspension Take 30 mLs by mouth daily as needed for indigestion.   Cyanocobalamin (VITAMIN B 12 PO) Take 5,000 mcg by mouth daily.   desoximetasone (TOPICORT) 0.25 % cream Apply 1 application topically 2 (two) times daily as needed (lichen planus on legs).    dicyclomine (BENTYL) 10 MG capsule Take 1 capsule  (10 mg total) by mouth 2 (two) times daily as needed for spasms.   diltiazem (CARDIZEM CD) 120 MG 24 hr capsule Take 1 capsule (120 mg total) by mouth daily.   diltiazem (CARDIZEM) 30 MG tablet TAKE 1 TABLET (30 MG TOTAL) BY MOUTH EVERY 6 (SIX) HOURS AS NEEDED (PALPITATIONS).   docusate sodium (STOOL SOFTENER) 100 MG capsule Take 1 capsule (100 mg total) by mouth 2 (two) times daily. (Patient taking differently: Take 100 mg by mouth as needed for mild constipation.)   ENSURE PLUS (ENSURE PLUS) LIQD Take 350 mLs by mouth daily.    ergocalciferol (VITAMIN D2) 1.25 MG (50000 UT) capsule 50,000 Units. Take 1 tablet by mouth weekly on weeks 1-3, take 2 tablet by mouth on the 4th week   escitalopram (LEXAPRO) 10 MG tablet Take 10 mg by mouth daily.   escitalopram (LEXAPRO) 5 MG tablet Take 5 mg by mouth daily.   esomeprazole (NEXIUM) 20 MG capsule Take 20 mg by mouth daily as needed (heartburn).   Evolocumab (REPATHA SURECLICK) 361 MG/ML SOAJ Inject into the skin every 30 (thirty) days.   fluticasone (FLONASE) 50 MCG/ACT nasal spray Place 1 spray into both nostrils daily as needed for allergies.   meclizine (ANTIVERT) 25 MG tablet Take 1 tablet (25 mg total) by mouth daily as needed for dizziness or nausea.   Multiple Vitamins-Minerals (CENTRUM SILVER 50+WOMEN) TABS Take 1 capsule by mouth daily.   polyethylene glycol powder (GLYCOLAX/MIRALAX) 17 GM/SCOOP powder Take 17 g by mouth as needed.   pravastatin (PRAVACHOL) 40 MG tablet Take 40 mg by mouth daily.   psyllium (METAMUCIL) 0.52 g capsule Take 1.04 g by mouth daily.   No facility-administered encounter medications on file as of 07/15/2021.  :   Review of Systems:  Out of a complete 14 point review of systems, all are reviewed and negative with the exception of these symptoms as listed below:  Review of Systems  Neurological:        Patient is here alone for sleep consult. She reports she was not sleeping enough  hours because she was staying  up late. She would wake up not feeling rested. She put herself on a schedule where she goes to sleep at 11:30 and wakes up at 8:30 AM. She no longer feels tired when she wakes up. She states the only time she might have to get up in the middle of the night once if she has to use the bathroom. She drinks 74 fluid oz per day. She is able to fall back asleep easily. She states she was referred here because she told her doctor that she woke up tired. ESS 3 FSS 21   Objective:  Neurological Exam  Physical Exam Physical Examination:   Vitals:   07/15/21 1214  BP: 117/71  Pulse: 69    General Examination: The patient is a very pleasant 81 y.o. female in no acute distress. She appears well-developed and well-nourished and well groomed.   HEENT: Normocephalic, atraumatic, pupils are equal, round and reactive to light, extraocular tracking is good without limitation to gaze excursion or nystagmus noted. Hearing is grossly intact. Face is symmetric with normal facial animation. Speech is clear with no dysarthria noted. There is no hypophonia. There is no lip, neck/head, jaw or voice tremor. Neck is supple with full range of passive and active motion. There are no carotid bruits on auscultation. Oropharynx exam reveals: Mild mouth dryness, good dental hygiene.  Small airway entry.  Minimal overbite.  Tongue protrudes centrally and palate elevates symmetrically.  She has a small blister from a skin cancer removal on the right side of her neck.  She also has a larger blister from skin cancer removal recently from her right parasternal area.   Chest: Clear to auscultation without wheezing, rhonchi or crackles noted.  Heart: S1+S2+0, regular and normal without murmurs, rubs or gallops noted.   Abdomen: Soft, non-tender and non-distended.  Extremities: There is no pitting edema in the distal lower extremities bilaterally.   Skin: Warm and dry without trophic changes noted.   Musculoskeletal: exam reveals  no obvious joint deformities, tenderness or joint swelling or erythema.   Neurologically:  Mental status: The patient is awake, alert and oriented in all 4 spheres. Her immediate and remote memory, attention, language skills and fund of knowledge are appropriate. There is no evidence of aphasia, agnosia, apraxia or anomia. Speech is clear with normal prosody and enunciation. Thought process is linear. Mood is normal and affect is normal.  Cranial nerves II - XII are as described above under HEENT exam.  Motor exam: Normal bulk, strength and tone is noted. There is no obvious tremor.  Fine motor skills and coordination: grossly intact.  Cerebellar testing: No dysmetria or intention tremor. There is no truncal or gait ataxia.  Sensory exam: intact to light touch in the upper and lower extremities.  Gait, station and balance: She stands easily. No veering to one side is noted. No leaning to one side is noted. Posture is age-appropriate and stance is narrow based. Gait shows normal stride length and normal pace. No problems turning are noted.   Assessment and Plan:   In summary, BRENLEE KOSKELA is a very pleasant 81 y.o.-year old female with an underlying complex medical history of aortic insufficiency, carotid artery stenosis, PVCs, NSVT, mitral regurgitation, osteoporosis, irritable bowel syndrome, history of diverticulitis, history of kidney stone, hypertension, hyperlipidemia, history of TIA, thyroid disease, vitamin D deficiency, and mildly overweight state, whose history and physical exam are concerning for obstructive sleep apnea (OSA). I had a long  chat with the patient about my findings and the diagnosis of OSA, its prognosis and treatment options. We talked about medical treatments, surgical interventions and non-pharmacological approaches. I explained in particular the risks and ramifications of untreated moderate to severe OSA, especially with respect to developing cardiovascular disease  down the Road, including congestive heart failure, difficult to treat hypertension, cardiac arrhythmias, or stroke. Even type 2 diabetes has, in part, been linked to untreated OSA. Symptoms of untreated OSA include daytime sleepiness, memory problems, mood irritability and mood disorder such as depression and anxiety, lack of energy, as well as recurrent headaches, especially morning headaches. We talked about trying to maintain a healthy lifestyle in general, as well as the importance of weight control. We also talked about the importance of good sleep hygiene. I recommended the following at this time: sleep study.  I outlined the differences between a laboratory attended sleep study versus home sleep testing.  She would prefer a home sleep test.  We mutually agreed to delay the test until she has healed from her skin cancer removal from the chest area. I explained the sleep test procedure to the patient and also outlined possible surgical and non-surgical treatment options of OSA, including the use of a custom-made dental device (which would require a referral to a specialist dentist or oral surgeon), upper airway surgical options, such as traditional UPPP or a novel less invasive surgical option in the form of Inspire hypoglossal nerve stimulation (which would involve a referral to an ENT surgeon). I also explained the CPAP treatment option to the patient, who indicated that  would be willing to try PAP therapy, if the need arises. I explained the importance of being compliant with PAP treatment, not only for insurance purposes but primarily to improve Her symptoms, and for the patient's long term health benefit, including to reduce Her cardiovascular risks. I answered all her questions today and the patient was in agreement. I plan to see her back after the sleep study is completed and encouraged her to call with any interim questions, concerns, problems or updates.   Thank you very much for allowing me to  participate in the care of this nice patient. If I can be of any further assistance to you please do not hesitate to call me at [phone].  Sincerely,   Star Age, MD, PhD

## 2021-08-02 DIAGNOSIS — M7061 Trochanteric bursitis, right hip: Secondary | ICD-10-CM | POA: Diagnosis not present

## 2021-08-17 DIAGNOSIS — M25551 Pain in right hip: Secondary | ICD-10-CM | POA: Diagnosis not present

## 2021-08-23 ENCOUNTER — Other Ambulatory Visit: Payer: Self-pay | Admitting: Internal Medicine

## 2021-08-23 DIAGNOSIS — Z1231 Encounter for screening mammogram for malignant neoplasm of breast: Secondary | ICD-10-CM

## 2021-08-25 ENCOUNTER — Encounter (HOSPITAL_BASED_OUTPATIENT_CLINIC_OR_DEPARTMENT_OTHER): Payer: Self-pay | Admitting: Emergency Medicine

## 2021-08-25 ENCOUNTER — Emergency Department (HOSPITAL_BASED_OUTPATIENT_CLINIC_OR_DEPARTMENT_OTHER): Payer: Medicare PPO

## 2021-08-25 ENCOUNTER — Emergency Department (HOSPITAL_BASED_OUTPATIENT_CLINIC_OR_DEPARTMENT_OTHER)
Admission: EM | Admit: 2021-08-25 | Discharge: 2021-08-25 | Disposition: A | Discharge status: Home / Self Care | Payer: Medicare PPO | Attending: Emergency Medicine | Admitting: Emergency Medicine

## 2021-08-25 ENCOUNTER — Other Ambulatory Visit: Payer: Self-pay

## 2021-08-25 ENCOUNTER — Emergency Department (HOSPITAL_COMMUNITY): Payer: Medicare PPO

## 2021-08-25 DIAGNOSIS — Z20822 Contact with and (suspected) exposure to covid-19: Secondary | ICD-10-CM | POA: Insufficient documentation

## 2021-08-25 DIAGNOSIS — Z7982 Long term (current) use of aspirin: Secondary | ICD-10-CM | POA: Insufficient documentation

## 2021-08-25 DIAGNOSIS — R002 Palpitations: Secondary | ICD-10-CM | POA: Diagnosis not present

## 2021-08-25 DIAGNOSIS — R55 Syncope and collapse: Secondary | ICD-10-CM | POA: Diagnosis not present

## 2021-08-25 DIAGNOSIS — R9431 Abnormal electrocardiogram [ECG] [EKG]: Secondary | ICD-10-CM | POA: Diagnosis not present

## 2021-08-25 DIAGNOSIS — R42 Dizziness and giddiness: Secondary | ICD-10-CM | POA: Insufficient documentation

## 2021-08-25 DIAGNOSIS — R29818 Other symptoms and signs involving the nervous system: Secondary | ICD-10-CM | POA: Diagnosis not present

## 2021-08-25 LAB — CBC WITH DIFFERENTIAL/PLATELET
Abs Immature Granulocytes: 0.04 10*3/uL (ref 0.00–0.07)
Basophils Absolute: 0.1 10*3/uL (ref 0.0–0.1)
Basophils Relative: 1 %
Eosinophils Absolute: 0.1 10*3/uL (ref 0.0–0.5)
Eosinophils Relative: 1 %
HCT: 43.1 % (ref 36.0–46.0)
Hemoglobin: 14.3 g/dL (ref 12.0–15.0)
Immature Granulocytes: 1 %
Lymphocytes Relative: 29 %
Lymphs Abs: 2 10*3/uL (ref 0.7–4.0)
MCH: 30.4 pg (ref 26.0–34.0)
MCHC: 33.2 g/dL (ref 30.0–36.0)
MCV: 91.7 fL (ref 80.0–100.0)
Monocytes Absolute: 0.5 10*3/uL (ref 0.1–1.0)
Monocytes Relative: 8 %
Neutro Abs: 4.2 10*3/uL (ref 1.7–7.7)
Neutrophils Relative %: 60 %
Platelets: 228 10*3/uL (ref 150–400)
RBC: 4.7 MIL/uL (ref 3.87–5.11)
RDW: 12.7 % (ref 11.5–15.5)
WBC: 6.9 10*3/uL (ref 4.0–10.5)
nRBC: 0 % (ref 0.0–0.2)

## 2021-08-25 LAB — COMPREHENSIVE METABOLIC PANEL
ALT: 15 U/L (ref 0–44)
AST: 15 U/L (ref 15–41)
Albumin: 4.5 g/dL (ref 3.5–5.0)
Alkaline Phosphatase: 37 U/L — ABNORMAL LOW (ref 38–126)
Anion gap: 13 (ref 5–15)
BUN: 22 mg/dL (ref 8–23)
CO2: 24 mmol/L (ref 22–32)
Calcium: 10 mg/dL (ref 8.9–10.3)
Chloride: 103 mmol/L (ref 98–111)
Creatinine, Ser: 0.99 mg/dL (ref 0.44–1.00)
GFR, Estimated: 57 mL/min — ABNORMAL LOW (ref >60)
Glucose, Bld: 104 mg/dL — ABNORMAL HIGH (ref 70–99)
Potassium: 4 mmol/L (ref 3.5–5.1)
Sodium: 140 mmol/L (ref 135–145)
Total Bilirubin: 0.8 mg/dL (ref 0.3–1.2)
Total Protein: 7.2 g/dL (ref 6.5–8.1)

## 2021-08-25 LAB — APTT: aPTT: 29 seconds (ref 24–36)

## 2021-08-25 LAB — PROTIME-INR
INR: 1.1 (ref 0.8–1.2)
Prothrombin Time: 14.2 seconds (ref 11.4–15.2)

## 2021-08-25 LAB — RESP PANEL BY RT-PCR (FLU A&B, COVID) ARPGX2
Influenza A by PCR: NEGATIVE
Influenza B by PCR: NEGATIVE
SARS Coronavirus 2 by RT PCR: NEGATIVE

## 2021-08-25 NOTE — ED Provider Notes (Signed)
Patient presents from [place] ED to [facility] ED for MRI brain.    Patient was having weakness and unsteady gait that started yesterday, not a code stroke.    Negative CT head, CTA head and neck, laboratory work-up.  She does not have any acute focal deficits on neuro exam documented by the previous provider or on my exam.  Specifically no pronator drift, no dysmetria.  Cranial nerves III through XII are grossly intact and patient is able to ambulate to and from her bed to the restroom.  Patient is stable, not in any acute distress at this time.   9:19 PM -I reevaluated patient, she is resting comfortably awaiting MRI.  States she is not claustrophobic and does not need Ativan for the scan.   I reviewed the MRI which does not show any acute process.  I also reviewed the previous providers note including disposition plan.  We will ambulate the patient, I do not think she needs admission or additional work-up at this time.  I suspect this is likely a positional dizziness syndrome.  Patient ambulated without just difficulty without any assistance with a cane or walker.  Stable for outpatient follow-up.   Sherrill Raring, PA-C 08/25/21 2254    Hayden Rasmussen, MD 08/26/21 1018

## 2021-08-25 NOTE — ED Triage Notes (Signed)
Pt arrives to ED with c/o lightheadedness. This started yesterday with associated diaphoresis and near syncope. Pt reports she took Meclizine which helps but the lightheadedness is recurrent. She reports she feels like her ear are "stopped up."

## 2021-08-25 NOTE — ED Provider Notes (Signed)
Blood pressure (!) 126/56, pulse 64, temperature 98.4 F (36.9 C), resp. rate 17, height '5\' 1"'$  (1.549 m), weight 68.9 kg, SpO2 94 %.  Assuming care from Dr. Johnney Killian.  In short, [name] is a 81 y.o. female with a chief complaint of Lightheaded .  Refer to the original H&P for additional details.  The current plan of care is to follow up on head CT and labs. Plan for transfer for MRI.   Patient's labs and CT head are reassuring. Plan for transfer to [facility] for MRI with report of some gait instability. Dr. Gilford Raid accepting.     Margette Fast, MD 08/28/21 [phone]

## 2021-08-25 NOTE — ED Notes (Signed)
Pt bib [facility] from [facility] for an MRI. Pt arrives AOx4 and NIH 0

## 2021-08-25 NOTE — ED Notes (Signed)
Patient transported to CT 

## 2021-08-25 NOTE — Discharge Instructions (Addendum)
Your MRI today was normal, your work-up today did not show any source or signs of stroke.  I do think you need to follow-up with your primary care doctor in the next 2 days but should continue having symptoms if you have any lateralized weakness on one side of your body should return back to ED for further evaluation.

## 2021-08-25 NOTE — ED Provider Notes (Signed)
[facility] DEPT Provider Note   CSN: [id number] Arrival date & time: 08/25/21  1101     History  Chief Complaint  Patient presents with   Lightheaded    [name] is a 81 y.o. female.  HPI Patient reports she is being episodes of lightheadedness.  When we discussed this in detail she reports she starts to feel like she is unsteady and might go to the side or fall and has been holding onto things or stabilize yourself to make sure she does not fall down.  Patient was diagnosed with vertigo a long time ago and has been trying some meclizine for the symptoms.  She reports however it does seem like they keep coming back.  She has not had any visual changes.  No headache.  She reports sometimes it feels like her ears have fluid in them and there is some ringing.  She has not noticed any focal weakness or numbness of the extremities.  However when we did strength testing she felt like there was a slight difference with the left being a little incoordinated.  At baseline patient is healthy and active, she drives and takes care of herself at home.    Home Medications Prior to Admission medications   Medication Sig Start Date End Date Taking? Authorizing Provider  acetaminophen (TYLENOL) 500 MG tablet Take 500-1,000 mg by mouth every 6 (six) hours as needed for moderate pain.    [provider]  alum & mag hydroxide-simeth (MYLANTA MAXIMUM STRENGTH) 400-400-40 MG/5ML suspension Take 10 mLs by mouth every 6 (six) hours as needed for indigestion. 05/30/19   [provider]  aspirin EC 81 MG tablet Take 1 tablet (81 mg total) by mouth daily. 12/18/12   Burnell Blanks, MD  bifidobacterium infantis (ALIGN) capsule Take 1 capsule by mouth daily. 05/09/12   [provider]  bisacodyl (DULCOLAX) 10 MG suppository Place 10 mg rectally as needed for moderate constipation.    [provider]  bismuth subsalicylate (PEPTO-BISMOL) 262  MG/15ML suspension Take 30 mLs by mouth daily as needed for indigestion. 05/30/19   [provider]  Cyanocobalamin (VITAMIN B 12 PO) Take 5,000 mcg by mouth daily.    [provider]  desoximetasone (TOPICORT) 0.25 % cream Apply 1 application topically 2 (two) times daily as needed (lichen planus on legs).     [provider]  dicyclomine (BENTYL) 10 MG capsule Take 1 capsule (10 mg total) by mouth 2 (two) times daily as needed for spasms. 11/18/20   Thornton Park, MD  diltiazem (CARDIZEM CD) 120 MG 24 hr capsule Take 1 capsule (120 mg total) by mouth daily. 04/05/21   Burnell Blanks, MD  diltiazem (CARDIZEM) 30 MG tablet TAKE 1 TABLET (30 MG TOTAL) BY MOUTH EVERY 6 (SIX) HOURS AS NEEDED (PALPITATIONS). 01/07/20   Burnell Blanks, MD  docusate sodium (STOOL SOFTENER) 100 MG capsule Take 1 capsule (100 mg total) by mouth 2 (two) times daily. Patient taking differently: Take 100 mg by mouth as needed for mild constipation. 11/18/20   Thornton Park, MD  ENSURE PLUS (ENSURE PLUS) LIQD Take 350 mLs by mouth daily.     [provider]  ergocalciferol (VITAMIN D2) 1.25 MG (50000 UT) capsule 50,000 Units. Take 1 tablet by mouth weekly on weeks 1-3, take 2 tablet by mouth on the 4th week    [provider]  escitalopram (LEXAPRO) 10 MG tablet Take 10 mg by mouth daily.  [provider]  escitalopram (LEXAPRO) 5 MG tablet Take 5 mg by mouth daily.    [provider]  esomeprazole (NEXIUM) 20 MG capsule Take 20 mg by mouth daily as needed (heartburn).    [provider]  Evolocumab (REPATHA SURECLICK) 867 MG/ML SOAJ Inject into the skin every 30 (thirty) days.    [provider]  fluticasone (FLONASE) 50 MCG/ACT nasal spray Place 1 spray into both nostrils daily as needed for allergies.    [provider]  meclizine (ANTIVERT) 25 MG tablet Take 1 tablet (25 mg total) by mouth daily as needed for  dizziness or nausea. 09/16/15   Burnell Blanks, MD  Multiple Vitamins-Minerals (CENTRUM SILVER 50+WOMEN) TABS Take 1 capsule by mouth daily. 07/15/19   [provider]  polyethylene glycol powder (GLYCOLAX/MIRALAX) 17 GM/SCOOP powder Take 17 g by mouth as needed.    [provider]  pravastatin (PRAVACHOL) 40 MG tablet Take 40 mg by mouth daily.    [provider]  psyllium (METAMUCIL) 0.52 g capsule Take 1.04 g by mouth daily.    [provider]      Allergies    Penicillins, Sulfonamide derivatives, Boniva [ibandronic acid], Penicillamine, Sulfa antibiotics, Zocor [simvastatin], Flagyl [metronidazole], Iodinated contrast media, and Prednisone    Review of Systems   Review of Systems 10 systems reviewed negative except as per HPI Physical Exam Updated Vital Signs BP (!) 146/69   Pulse 72   Temp 98.4 F (36.9 C)   Resp 17   Ht '5\' 1"'$  (1.549 m)   Wt 68.9 kg   LMP  (LMP Unknown)   SpO2 94%   BMI 28.72 kg/m  Physical Exam Constitutional:      Comments: Alert nontoxic and well in appearance.  HENT:     Head: Normocephalic and atraumatic.     Right Ear: Tympanic membrane normal.     Left Ear: Tympanic membrane normal.     Nose: Nose normal.     Mouth/Throat:     Mouth: Mucous membranes are moist.     Pharynx: Oropharynx is clear.  Eyes:     Extraocular Movements: Extraocular movements intact.     Conjunctiva/sclera: Conjunctivae normal.     Pupils: Pupils are equal, round, and reactive to light.  Cardiovascular:     Rate and Rhythm: Normal rate.     Comments: Heart regular with occasional ectopic beats no gross rub murmur gallop Pulmonary:     Effort: Pulmonary effort is normal.     Breath sounds: Normal breath sounds.  Abdominal:     General: There is no distension.     Palpations: Abdomen is soft.     Tenderness: There is no abdominal tenderness. There is no guarding.  Musculoskeletal:        General: No swelling, tenderness  or deformity. Normal range of motion.     Cervical back: Neck supple.     Right lower leg: No edema.     Left lower leg: No edema.  Skin:    General: Skin is warm and dry.  Neurological:     Comments: Cognitive function is normal.  Speech is clear.  Recall is intact.  Normal finger-nose exam bilaterally.  No cranial nerve deficits.  Grip strength 5\5 symmetric.  Lower extremity patient can hold each leg independently off of the bed and resist downward pressure.  She perceives a slight weakness or slight incoordination to the left relative to the right.  Psychiatric:  Mood and Affect: Mood normal.     ED Results / Procedures / Treatments   Labs (all labs ordered are listed, but only abnormal results are displayed) Labs Reviewed  COMPREHENSIVE METABOLIC PANEL - Abnormal; Notable for the following components:      Result Value   Glucose, Bld 104 (*)    Alkaline Phosphatase 37 (*)    GFR, Estimated 57 (*)    All other components within normal limits  RESP PANEL BY RT-PCR (FLU A&B, COVID) ARPGX2  CBC WITH DIFFERENTIAL/PLATELET  PROTIME-INR  APTT  URINALYSIS, ROUTINE W REFLEX MICROSCOPIC    EKG EKG Interpretation  Date/Time:  Wednesday August 25 2021 11:42:54 EDT Ventricular Rate:  85 PR Interval:  184 QRS Duration: 84 QT Interval:  374 QTC Calculation: 445 R Axis:   -45 Text Interpretation: Sinus rhythm with frequent Premature ventricular complexes Left anterior fascicular block Possible Anterior infarct (cited on or before 25-Aug-2021) Abnormal ECG When compared with ECG of 02-Mar-2021 15:44, Questionable change in initial forces of Lateral leads similar to previous with increased frequency of PVC Confirmed by Charlesetta Shanks [phone]) on 08/25/2021 3:40:06 PM  Radiology No results found.  Procedures Procedures    Medications Ordered in ED Medications - No data to display  ED Course/ Medical Decision Making/ A&P                           Medical Decision  Making Amount and/or Complexity of Data Reviewed Labs: ordered. Radiology: ordered.   Patient presents as outlined.  She describes symptoms of vertigo feeling unsteady and holding onto things to make sure she does not fall.  Symptoms been present for several days.  Patient had empirically tried to treat herself for vertigo with meclizine without much improvement.  Exam does not reveal any focal deficits.  Differential diagnosis includes cerebellar stroke\vertigo\mechanical gait instability we will proceed with stroke evaluation.  Blood pressures are normal.  Screening labs are within normal limits.  No evidence of electrolyte imbalance  CT head pending.  Dr. Laverta Baltimore to review results from CT head anticipate if within normal limits plan will be to obtain MRI to rule out cerebellar stroke.  If no urgent findings on MRI, I anticipate patient will be stable for discharge with positional vertigo and close outpatient follow-up.        Final Clinical Impression(s) / ED Diagnoses Final diagnoses:  Vertigo  Palpitations    Rx / DC Orders ED Discharge Orders     None         Charlesetta Shanks, MD 08/25/21 [phone]

## 2021-09-01 ENCOUNTER — Telehealth: Payer: Self-pay | Admitting: Cardiovascular Disease

## 2021-09-01 DIAGNOSIS — F419 Anxiety disorder, unspecified: Secondary | ICD-10-CM | POA: Diagnosis not present

## 2021-09-01 DIAGNOSIS — I493 Ventricular premature depolarization: Secondary | ICD-10-CM | POA: Diagnosis not present

## 2021-09-01 DIAGNOSIS — I129 Hypertensive chronic kidney disease with stage 1 through stage 4 chronic kidney disease, or unspecified chronic kidney disease: Secondary | ICD-10-CM | POA: Diagnosis not present

## 2021-09-01 DIAGNOSIS — R42 Dizziness and giddiness: Secondary | ICD-10-CM | POA: Diagnosis not present

## 2021-09-01 DIAGNOSIS — R2681 Unsteadiness on feet: Secondary | ICD-10-CM | POA: Diagnosis not present

## 2021-09-01 DIAGNOSIS — R400 Somnolence: Secondary | ICD-10-CM | POA: Diagnosis not present

## 2021-09-01 DIAGNOSIS — R001 Bradycardia, unspecified: Secondary | ICD-10-CM

## 2021-09-01 NOTE — Telephone Encounter (Signed)
Called PCP office.  The patient did not have EKG at her PCP appointment today.  Her HR there was 71.  She has no concerns about her daytime HR.

## 2021-09-01 NOTE — Telephone Encounter (Signed)
Agree with your concern about pseudobradycardia. Apple Watch and pulse ox are unfortunately unlikely to be helpful at tracking her HR due to her known frequent PVCs and may create unnecessary anxiety. Can bring in for EKG to confirm true HR. If having any new symptoms please offer for appt back to EP. Thanks.

## 2021-09-01 NOTE — Telephone Encounter (Signed)
Patient has been checking HR w her Apple Watch.   There have been some mornings when she checks that she has low heart rate alert..  It shows rates between 35-39 for 5-6 min at a time.  She feels this occurs when she sleeps on her left side.    She has been wearing the watch for a year or two and just started seeing the low HR alerts.    No syncope.  During the day she is feeling fine. She was in the ED on 7/5 for lightheadedness and states they leaned toward vertigo.  She saw Dr. Silvestre Mesi assistant today and they wanted her to call us to see if she may need a defibrillator.  She saw Dr. Lovena Le in April for PVCs and is prn follow up. This was based on her monitor from Feb where she had 11% PVCs and short runs of VT.  Lowest HR at that time overnight was 49.  She is now on Cardizem CD 120 mg .  She has gotten over the lightheadedness and feels like she may fall she may fall to the right.  Dr. Joylene Draft sent her to PT to see if would help.  The lady at PT said she would not be safe to participate for her safety.  Her PCP is helping to work on getting her into physical therapy.    We discussed the possibility that her watch is not accurately showing her HR due to her PVCs.  Pt is aware that I will forward to Dr. Angelena Form and D. Dunn, APP and will call her back with recommendations.

## 2021-09-01 NOTE — Telephone Encounter (Signed)
STAT if HR is under 50 or over 120 (normal HR is 60-100 beats per minute)  What is your heart rate?  69 resting HR currently  Do you have a log of your heart rate readings (document readings)?  No readings, but patient plans to start logging her readings.  Do you have any other symptoms?  Lightheadedness, not currently. See ED notes.  Patient states she recently visisted her PCP and informed them that her watch notifies her that sometimes her HR gets down to 35. Dr. Silvestre Mesi assistant advised she may need a defibrillator and that she may need an appointment sooner than November with Dr. Angelena Form. She would like to know what Dr. Angelena Form and Lisbeth Renshaw, [place] recommend.

## 2021-09-03 DIAGNOSIS — C44519 Basal cell carcinoma of skin of other part of trunk: Secondary | ICD-10-CM | POA: Diagnosis not present

## 2021-09-03 DIAGNOSIS — X32XXXD Exposure to sunlight, subsequent encounter: Secondary | ICD-10-CM | POA: Diagnosis not present

## 2021-09-03 DIAGNOSIS — L570 Actinic keratosis: Secondary | ICD-10-CM | POA: Diagnosis not present

## 2021-09-06 ENCOUNTER — Ambulatory Visit (INDEPENDENT_AMBULATORY_CARE_PROVIDER_SITE_OTHER): Payer: Medicare PPO

## 2021-09-06 DIAGNOSIS — I493 Ventricular premature depolarization: Secondary | ICD-10-CM

## 2021-09-06 DIAGNOSIS — R001 Bradycardia, unspecified: Secondary | ICD-10-CM

## 2021-09-06 NOTE — Progress Notes (Unsigned)
Enrolled for [employer] to mail a ZIO XT long term holter monitor to the patients address on file.  

## 2021-09-06 NOTE — Telephone Encounter (Signed)
Reviewed with Dr. Angelena Form.  He recommends 3 day zio to confirm palpitations/overnight heart rates. This will be mailed to her home. Pt voices understanding and agreement.  Order placed.

## 2021-09-08 DIAGNOSIS — R001 Bradycardia, unspecified: Secondary | ICD-10-CM

## 2021-09-08 DIAGNOSIS — I493 Ventricular premature depolarization: Secondary | ICD-10-CM | POA: Diagnosis not present

## 2021-10-06 ENCOUNTER — Ambulatory Visit
Admission: RE | Admit: 2021-10-06 | Discharge: 2021-10-06 | Disposition: A | Discharge status: Home / Self Care | Payer: Medicare PPO | Source: Ambulatory Visit | Attending: Internal Medicine | Admitting: Internal Medicine

## 2021-10-06 DIAGNOSIS — Z1231 Encounter for screening mammogram for malignant neoplasm of breast: Secondary | ICD-10-CM | POA: Diagnosis not present

## 2021-11-04 DIAGNOSIS — I498 Other specified cardiac arrhythmias: Secondary | ICD-10-CM | POA: Diagnosis not present

## 2021-11-04 DIAGNOSIS — Z8679 Personal history of other diseases of the circulatory system: Secondary | ICD-10-CM | POA: Diagnosis not present

## 2021-11-04 DIAGNOSIS — I444 Left anterior fascicular block: Secondary | ICD-10-CM | POA: Diagnosis not present

## 2021-11-04 DIAGNOSIS — R059 Cough, unspecified: Secondary | ICD-10-CM | POA: Diagnosis not present

## 2021-11-04 DIAGNOSIS — I493 Ventricular premature depolarization: Secondary | ICD-10-CM | POA: Diagnosis not present

## 2021-11-04 DIAGNOSIS — R002 Palpitations: Secondary | ICD-10-CM | POA: Diagnosis not present

## 2021-11-04 DIAGNOSIS — R42 Dizziness and giddiness: Secondary | ICD-10-CM | POA: Diagnosis not present

## 2021-11-04 DIAGNOSIS — I7090 Unspecified atherosclerosis: Secondary | ICD-10-CM | POA: Diagnosis not present

## 2021-12-05 DIAGNOSIS — N3941 Urge incontinence: Secondary | ICD-10-CM | POA: Diagnosis not present

## 2021-12-05 DIAGNOSIS — Z800 Family history of malignant neoplasm of digestive organs: Secondary | ICD-10-CM | POA: Diagnosis not present

## 2021-12-13 DIAGNOSIS — R3129 Other microscopic hematuria: Secondary | ICD-10-CM | POA: Diagnosis not present

## 2021-12-13 DIAGNOSIS — Z85828 Personal history of other malignant neoplasm of skin: Secondary | ICD-10-CM | POA: Diagnosis not present

## 2021-12-13 DIAGNOSIS — I10 Essential (primary) hypertension: Secondary | ICD-10-CM | POA: Diagnosis not present

## 2021-12-13 DIAGNOSIS — N3941 Urge incontinence: Secondary | ICD-10-CM | POA: Diagnosis not present

## 2022-02-10 DIAGNOSIS — N3941 Urge incontinence: Secondary | ICD-10-CM | POA: Diagnosis not present

## 2022-02-10 DIAGNOSIS — Z85828 Personal history of other malignant neoplasm of skin: Secondary | ICD-10-CM | POA: Diagnosis not present

## 2022-02-10 DIAGNOSIS — I10 Essential (primary) hypertension: Secondary | ICD-10-CM | POA: Diagnosis not present

## 2022-02-11 DIAGNOSIS — R531 Weakness: Secondary | ICD-10-CM | POA: Diagnosis not present

## 2022-02-11 DIAGNOSIS — I119 Hypertensive heart disease without heart failure: Secondary | ICD-10-CM | POA: Diagnosis not present

## 2022-02-11 DIAGNOSIS — T50995A Adverse effect of other drugs, medicaments and biological substances, initial encounter: Secondary | ICD-10-CM | POA: Diagnosis not present

## 2022-02-11 DIAGNOSIS — Y92003 Bedroom of unspecified non-institutional (private) residence as the place of occurrence of the external cause: Secondary | ICD-10-CM | POA: Diagnosis not present

## 2022-02-11 DIAGNOSIS — I10 Essential (primary) hypertension: Secondary | ICD-10-CM | POA: Diagnosis not present

## 2022-02-11 DIAGNOSIS — M810 Age-related osteoporosis without current pathological fracture: Secondary | ICD-10-CM | POA: Diagnosis not present

## 2022-02-11 DIAGNOSIS — R270 Ataxia, unspecified: Secondary | ICD-10-CM | POA: Diagnosis not present

## 2022-02-11 DIAGNOSIS — R197 Diarrhea, unspecified: Secondary | ICD-10-CM | POA: Diagnosis not present

## 2022-02-11 DIAGNOSIS — T443X5A Adverse effect of other parasympatholytics [anticholinergics and antimuscarinics] and spasmolytics, initial encounter: Secondary | ICD-10-CM | POA: Diagnosis not present

## 2022-02-11 DIAGNOSIS — R29898 Other symptoms and signs involving the musculoskeletal system: Secondary | ICD-10-CM | POA: Diagnosis not present

## 2022-02-11 DIAGNOSIS — Z85828 Personal history of other malignant neoplasm of skin: Secondary | ICD-10-CM | POA: Diagnosis not present

## 2022-02-11 DIAGNOSIS — Z79899 Other long term (current) drug therapy: Secondary | ICD-10-CM | POA: Diagnosis not present

## 2022-02-11 DIAGNOSIS — R200 Anesthesia of skin: Secondary | ICD-10-CM | POA: Diagnosis not present

## 2022-02-11 DIAGNOSIS — R42 Dizziness and giddiness: Secondary | ICD-10-CM | POA: Diagnosis not present

## 2022-02-11 DIAGNOSIS — R4781 Slurred speech: Secondary | ICD-10-CM | POA: Diagnosis not present

## 2022-02-11 DIAGNOSIS — E860 Dehydration: Secondary | ICD-10-CM | POA: Diagnosis not present

## 2022-02-23 DIAGNOSIS — R4182 Altered mental status, unspecified: Secondary | ICD-10-CM | POA: Diagnosis not present

## 2022-02-23 DIAGNOSIS — I493 Ventricular premature depolarization: Secondary | ICD-10-CM | POA: Diagnosis not present

## 2022-02-23 DIAGNOSIS — F419 Anxiety disorder, unspecified: Secondary | ICD-10-CM | POA: Diagnosis not present

## 2022-02-23 DIAGNOSIS — R627 Adult failure to thrive: Secondary | ICD-10-CM | POA: Diagnosis not present

## 2022-02-23 DIAGNOSIS — N393 Stress incontinence (female) (male): Secondary | ICD-10-CM | POA: Diagnosis not present

## 2022-02-23 DIAGNOSIS — E559 Vitamin D deficiency, unspecified: Secondary | ICD-10-CM | POA: Diagnosis not present

## 2022-02-23 DIAGNOSIS — G459 Transient cerebral ischemic attack, unspecified: Secondary | ICD-10-CM | POA: Diagnosis not present

## 2022-02-23 DIAGNOSIS — Z79899 Other long term (current) drug therapy: Secondary | ICD-10-CM | POA: Diagnosis not present

## 2022-02-24 DIAGNOSIS — H524 Presbyopia: Secondary | ICD-10-CM | POA: Diagnosis not present

## 2022-02-24 DIAGNOSIS — H2513 Age-related nuclear cataract, bilateral: Secondary | ICD-10-CM | POA: Diagnosis not present

## 2022-02-24 DIAGNOSIS — H52223 Regular astigmatism, bilateral: Secondary | ICD-10-CM | POA: Diagnosis not present

## 2022-02-24 DIAGNOSIS — H25813 Combined forms of age-related cataract, bilateral: Secondary | ICD-10-CM | POA: Diagnosis not present

## 2022-02-24 DIAGNOSIS — H5203 Hypermetropia, bilateral: Secondary | ICD-10-CM | POA: Diagnosis not present

## 2022-05-09 DIAGNOSIS — F0390 Unspecified dementia without behavioral disturbance: Secondary | ICD-10-CM | POA: Diagnosis not present

## 2022-05-28 DIAGNOSIS — F0390 Unspecified dementia without behavioral disturbance: Secondary | ICD-10-CM | POA: Diagnosis not present

## 2022-06-23 DIAGNOSIS — H2513 Age-related nuclear cataract, bilateral: Secondary | ICD-10-CM | POA: Diagnosis not present

## 2022-06-23 DIAGNOSIS — H353131 Nonexudative age-related macular degeneration, bilateral, early dry stage: Secondary | ICD-10-CM | POA: Diagnosis not present

## 2022-06-27 DIAGNOSIS — I10 Essential (primary) hypertension: Secondary | ICD-10-CM | POA: Diagnosis not present

## 2022-06-27 DIAGNOSIS — H25812 Combined forms of age-related cataract, left eye: Secondary | ICD-10-CM | POA: Diagnosis not present

## 2022-06-27 DIAGNOSIS — H2589 Other age-related cataract: Secondary | ICD-10-CM | POA: Diagnosis not present

## 2022-07-08 DIAGNOSIS — H2589 Other age-related cataract: Secondary | ICD-10-CM | POA: Diagnosis not present

## 2022-07-09 IMAGING — CT CT ABD-PELV W/O CM
2 of 4 series · 16 of 46 positions shown, 18 images · non-contrast
Comparison: 11/22/2020

CLINICAL DATA: Lower abdominal pain.  Evaluate for diverticulitis.

EXAM:
CT ABDOMEN AND PELVIS WITHOUT CONTRAST
TECHNIQUE: Multidetector CT imaging of the abdomen and pelvis was performed
following the standard protocol without IV contrast.

[Series 2: axial st · axial · 0.80mm/px · z∈[-454,-69]mm · 13 of 89 slices shown, 15 images]
[im 6/89  soft-tissue]
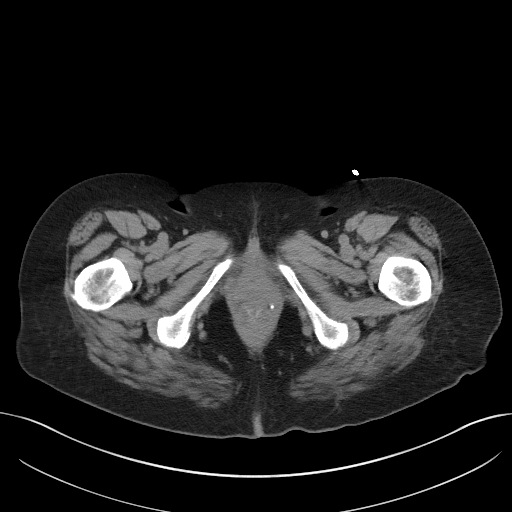
[im 6/89  bone]
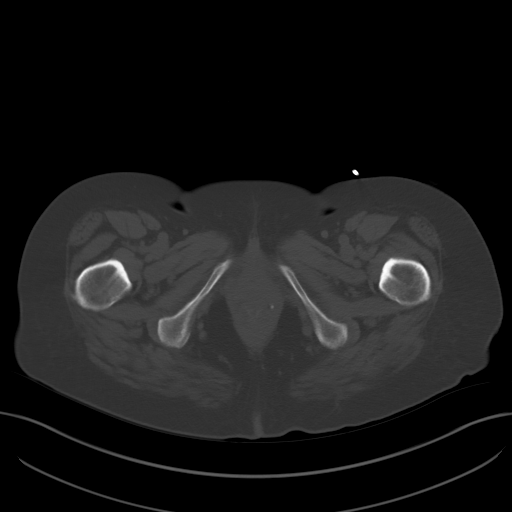
[im 12/89  soft-tissue]
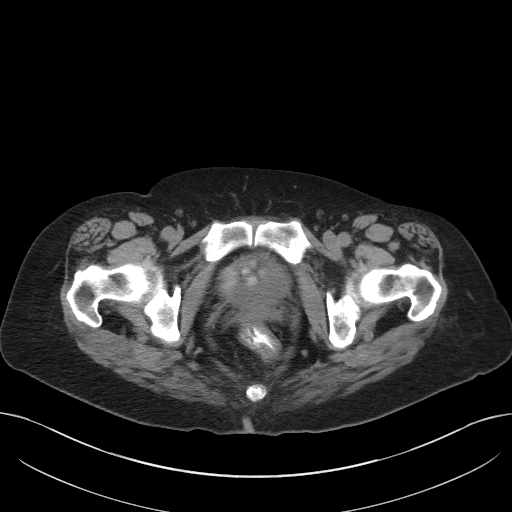
[im 17/89  soft-tissue]
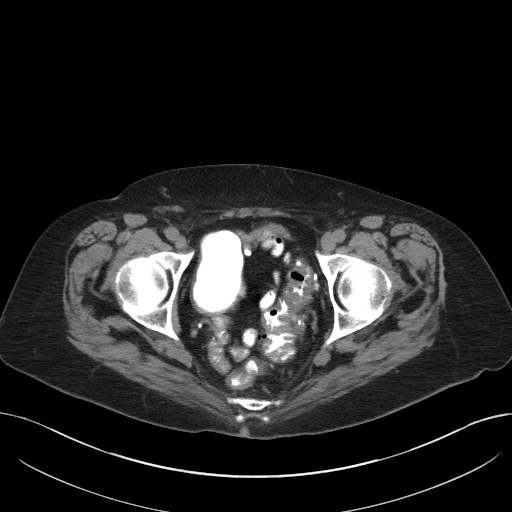
[im 28/89  soft-tissue]
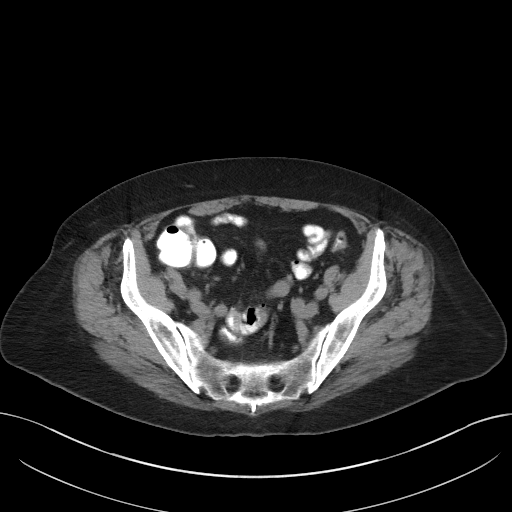
[im 34/89  soft-tissue]
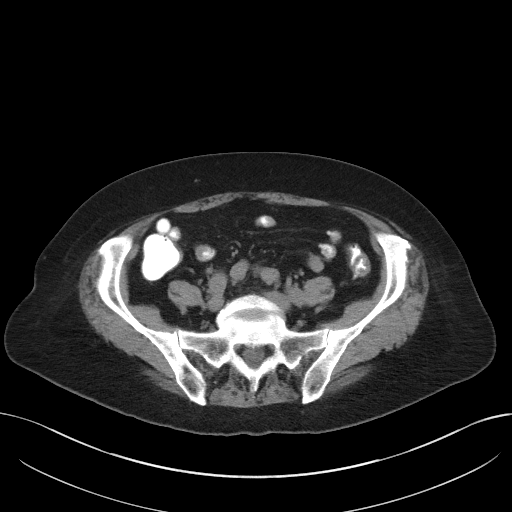
[im 39/89  soft-tissue]
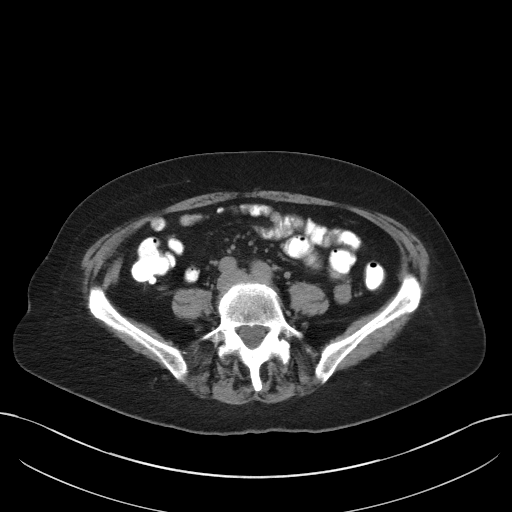
[im 45/89  soft-tissue]
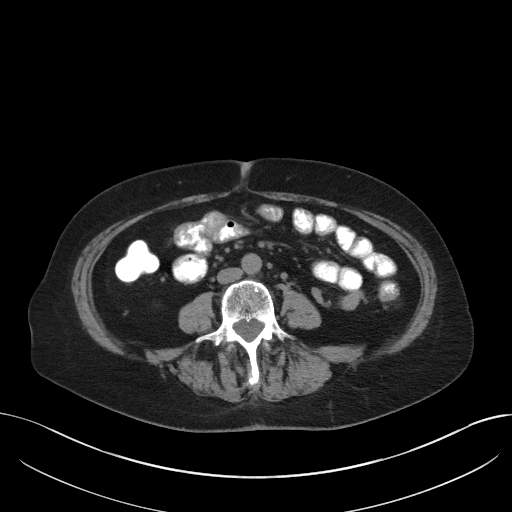
[im 50/89  soft-tissue]
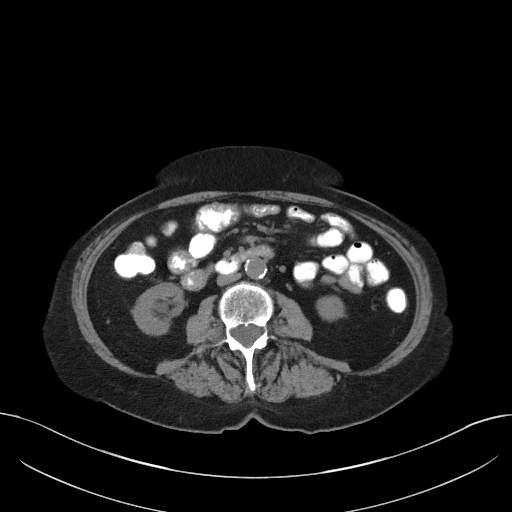
[im 56/89  soft-tissue]
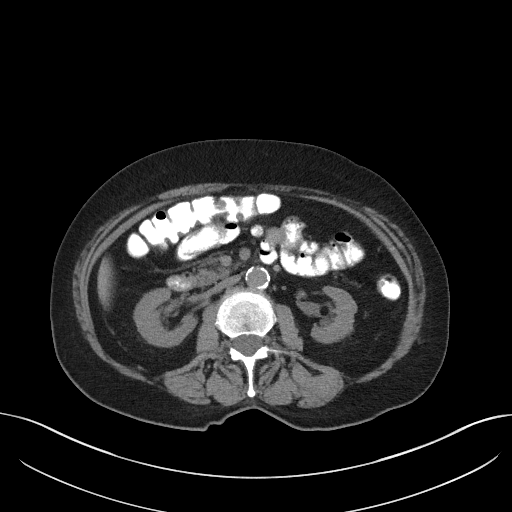
[im 56/89  bone]
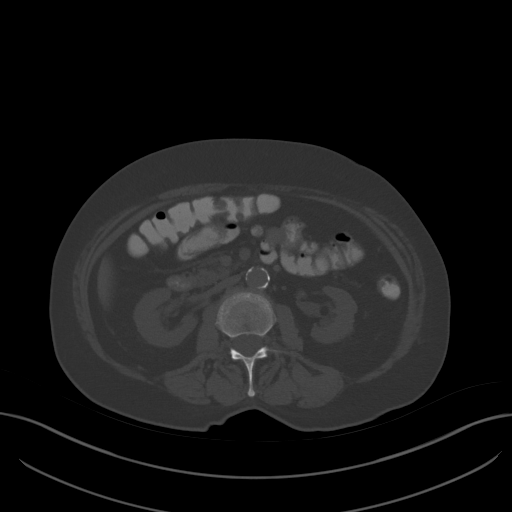
[im 61/89  soft-tissue]
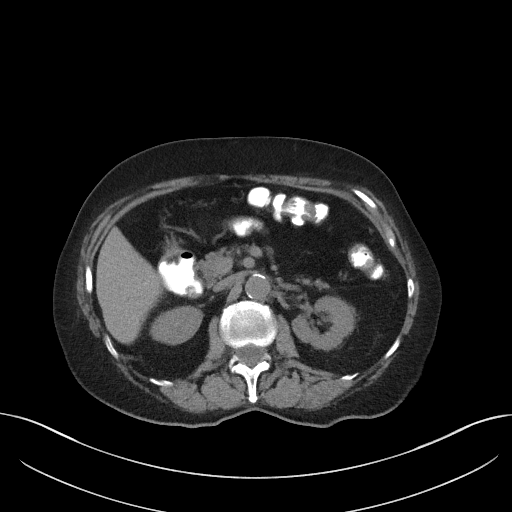
[im 72/89  soft-tissue]
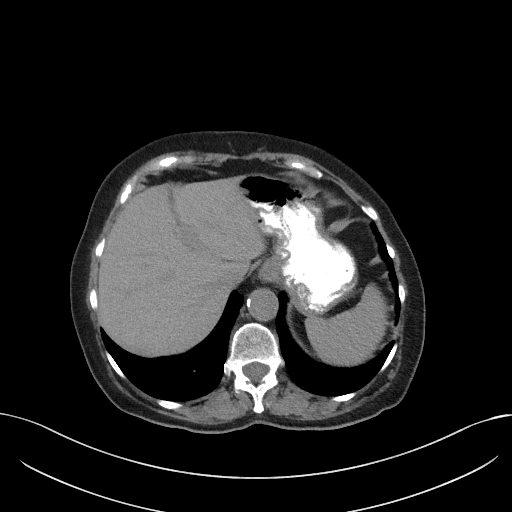
[im 78/89  soft-tissue]
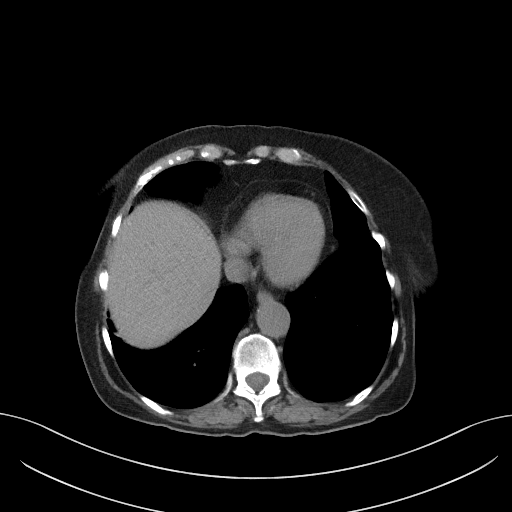
[im 83/89  soft-tissue]
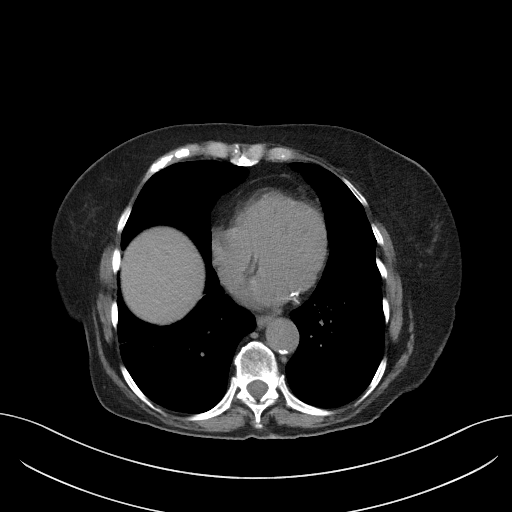

[Series 5: coronal st · coronal · 0.83mm/px · 3 of 85 slices shown]
[im 29/85  soft-tissue]
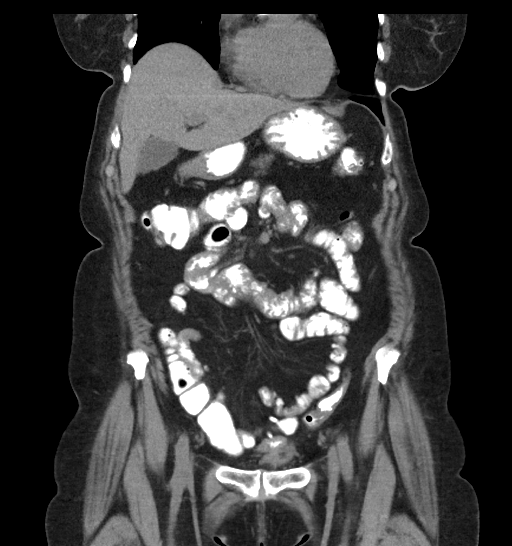
[im 38/85  soft-tissue]
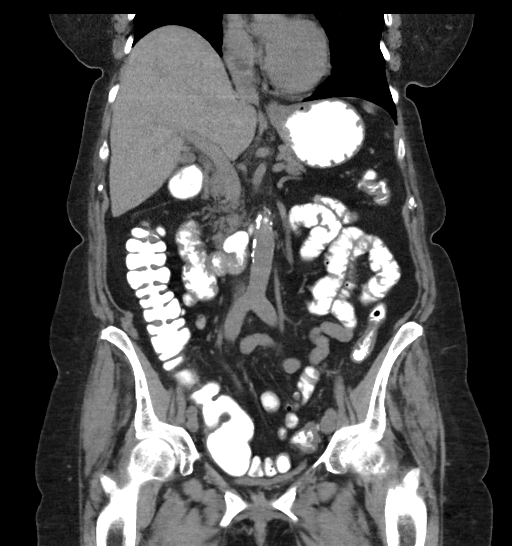
[im 47/85  soft-tissue]
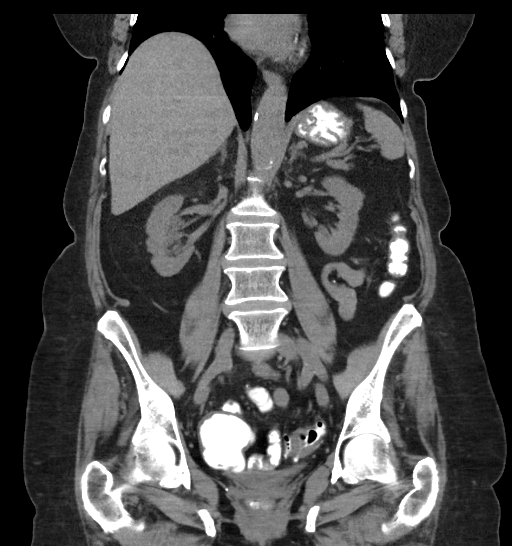

[16 of 46 positions shown; findings below may reference images not displayed]

FINDINGS: Lower chest: Mild linear scarring identified within the imaged
portions of the lower lungs. No acute abnormality.

Hepatobiliary: Small low-density structure within the dome of liver
is too small to characterize measuring 7 mm, unchanged from previous
exam. No suspicious liver abnormality. Gallbladder is normal.

Pancreas: Unremarkable. No pancreatic ductal dilatation or
surrounding inflammatory changes.

Spleen: Normal in size without focal abnormality.

Adrenals/Urinary Tract: Normal adrenal glands. Bilateral inferior
pole parapelvic cysts noted within both kidneys. No signs of
nephrolithiasis or hydronephrosis. Urinary bladder is unremarkable

Stomach/Bowel: Stomach appears normal. Interval improvement in
previously noted inflammatory changes associated with ileal
diverticulitis. Mild residual soft tissue stranding is identified in
this area, image 57/5. No focal fluid collections identified. No
bowel wall thickening, inflammation or distension identified at this
time. Extensive sigmoid diverticulosis is noted without acute
inflammation.

Vascular/Lymphatic: Aortic atherosclerosis without aneurysm. No
abdominopelvic adenopathy.

Reproductive: Status post hysterectomy. No adnexal masses.

Other: No abdominal wall hernia or abnormality. No abdominopelvic
ascites.

Musculoskeletal: No acute or significant osseous findings.
IMPRESSION: 1. No acute findings identified within the abdomen or pelvis.
2. Interval improvement in previously noted inflammatory changes
associated with ileal diverticulitis. No focal fluid collections
identified.
3. Aortic Atherosclerosis ([id number]).

## 2022-07-11 DIAGNOSIS — H2589 Other age-related cataract: Secondary | ICD-10-CM | POA: Diagnosis not present

## 2022-07-11 DIAGNOSIS — H25811 Combined forms of age-related cataract, right eye: Secondary | ICD-10-CM | POA: Diagnosis not present

## 2022-07-11 DIAGNOSIS — I10 Essential (primary) hypertension: Secondary | ICD-10-CM | POA: Diagnosis not present

## 2022-07-13 DIAGNOSIS — S5012XA Contusion of left forearm, initial encounter: Secondary | ICD-10-CM | POA: Diagnosis not present

## 2022-07-13 DIAGNOSIS — E785 Hyperlipidemia, unspecified: Secondary | ICD-10-CM | POA: Diagnosis not present

## 2022-07-13 DIAGNOSIS — S32110A Nondisplaced Zone I fracture of sacrum, initial encounter for closed fracture: Secondary | ICD-10-CM | POA: Diagnosis not present

## 2022-07-13 DIAGNOSIS — M7989 Other specified soft tissue disorders: Secondary | ICD-10-CM | POA: Diagnosis not present

## 2022-07-13 DIAGNOSIS — E7800 Pure hypercholesterolemia, unspecified: Secondary | ICD-10-CM | POA: Diagnosis not present

## 2022-07-13 DIAGNOSIS — S3219XA Other fracture of sacrum, initial encounter for closed fracture: Secondary | ICD-10-CM | POA: Diagnosis not present

## 2022-07-13 DIAGNOSIS — S3210XA Unspecified fracture of sacrum, initial encounter for closed fracture: Secondary | ICD-10-CM | POA: Diagnosis not present

## 2022-08-02 DIAGNOSIS — Z961 Presence of intraocular lens: Secondary | ICD-10-CM | POA: Diagnosis not present

## 2022-08-02 DIAGNOSIS — H04123 Dry eye syndrome of bilateral lacrimal glands: Secondary | ICD-10-CM | POA: Diagnosis not present

## 2022-08-02 DIAGNOSIS — H353131 Nonexudative age-related macular degeneration, bilateral, early dry stage: Secondary | ICD-10-CM | POA: Diagnosis not present

## 2022-08-16 DIAGNOSIS — G309 Alzheimer's disease, unspecified: Secondary | ICD-10-CM | POA: Diagnosis not present

## 2022-08-30 DIAGNOSIS — F01A0 Vascular dementia, mild, without behavioral disturbance, psychotic disturbance, mood disturbance, and anxiety: Secondary | ICD-10-CM | POA: Diagnosis not present

## 2022-08-30 DIAGNOSIS — F0390 Unspecified dementia without behavioral disturbance: Secondary | ICD-10-CM | POA: Diagnosis not present

## 2022-09-15 IMAGING — DX DG CHEST 2V
2 series · 2 of 2 positions shown · non-contrast
Comparison: 08/03/2018

CLINICAL DATA: Palpitations

EXAM:
CHEST - 2 VIEW

[chest pa]
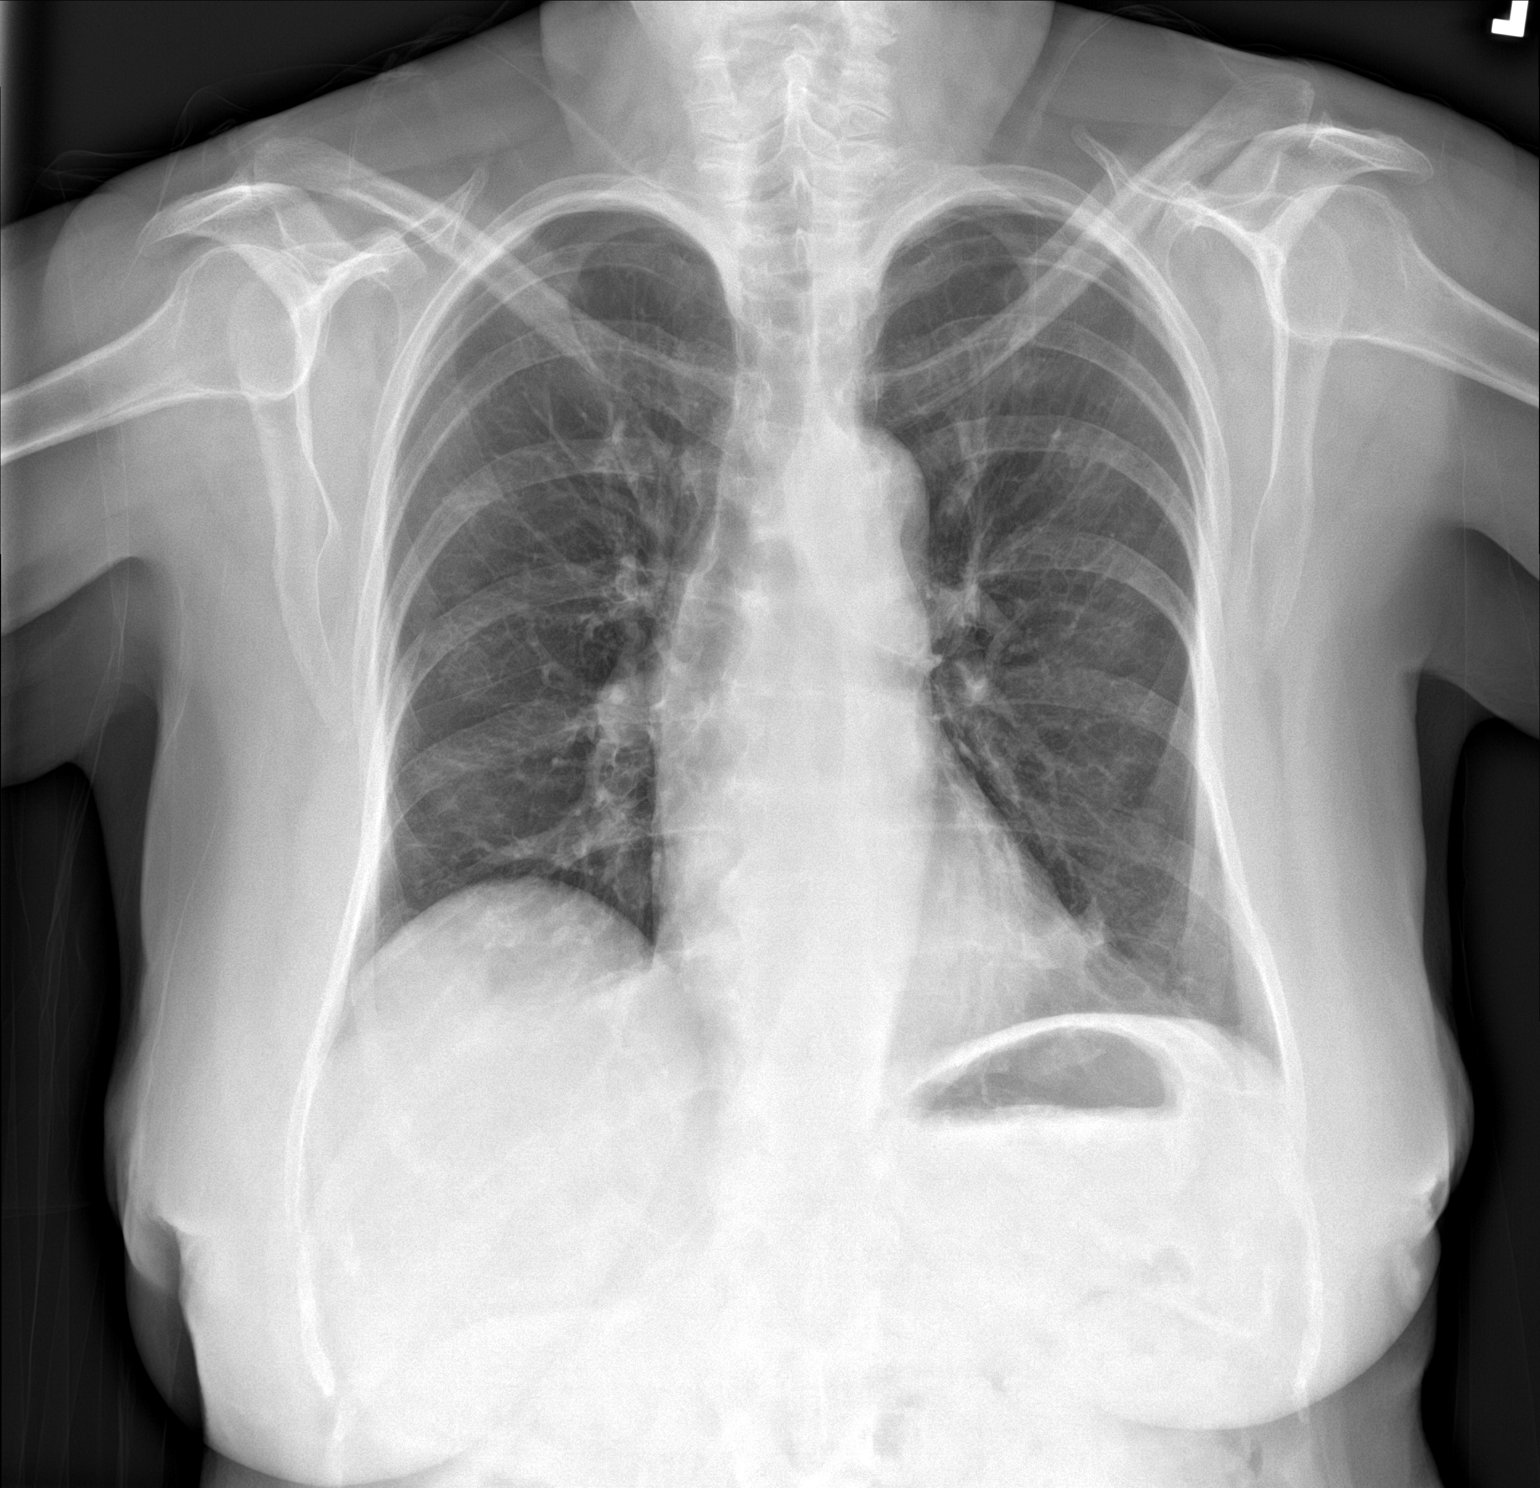

[chest lat]
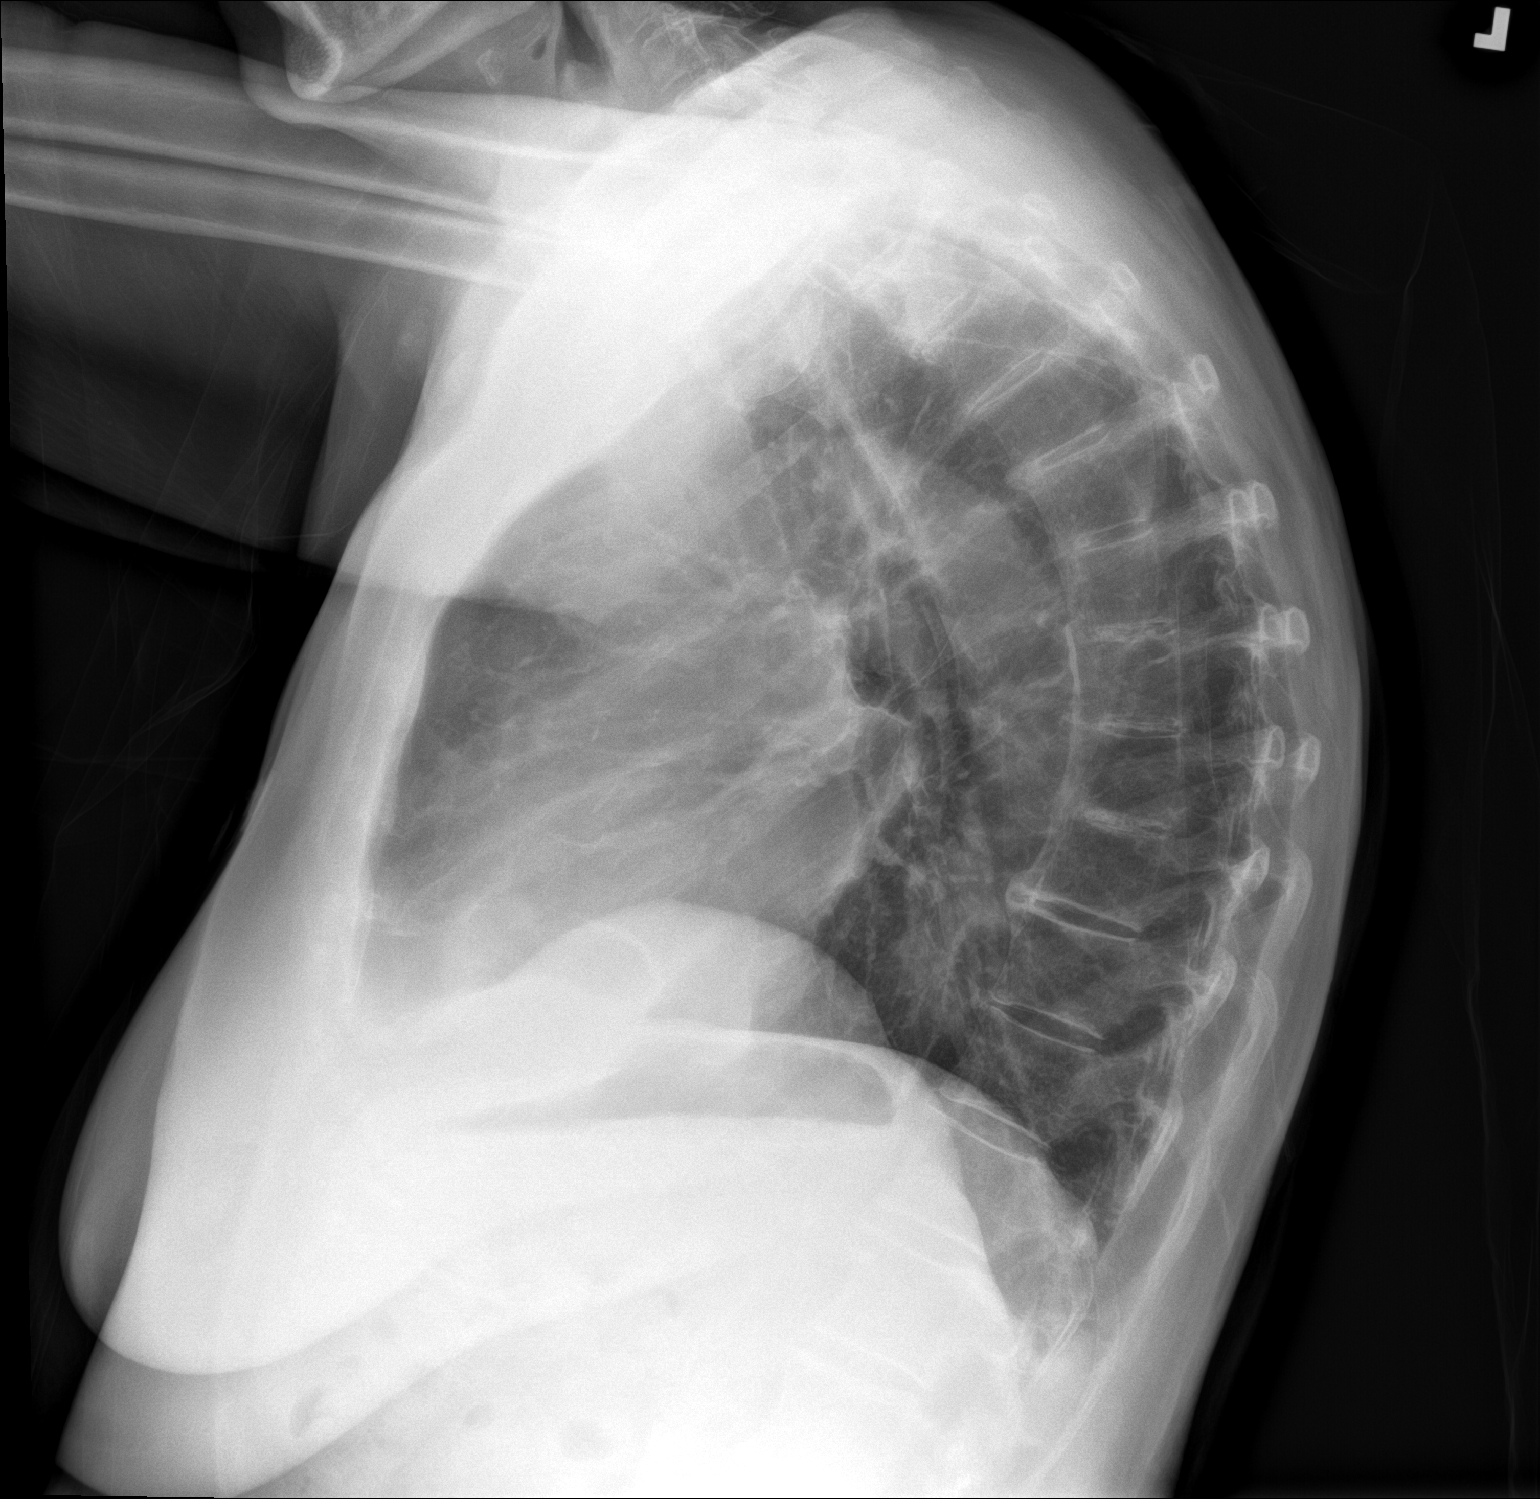

[2 of 2 positions shown; findings below may reference images not displayed]

FINDINGS: Cardiac size is within normal limits. Small patchy density in the
medial left lower lung fields adjacent to the cardiac margin has not
changed. There are no signs of pulmonary edema or focal pulmonary
consolidation. Increase in AP diameter of chest suggests COPD. There
is no pleural effusion or pneumothorax.
IMPRESSION: There are no focal infiltrates or signs of pulmonary edema.

## 2022-09-28 DIAGNOSIS — F0390 Unspecified dementia without behavioral disturbance: Secondary | ICD-10-CM | POA: Diagnosis not present

## 2022-09-28 DIAGNOSIS — F01A0 Vascular dementia, mild, without behavioral disturbance, psychotic disturbance, mood disturbance, and anxiety: Secondary | ICD-10-CM | POA: Diagnosis not present

## 2022-10-03 DIAGNOSIS — R102 Pelvic and perineal pain: Secondary | ICD-10-CM | POA: Diagnosis not present

## 2022-10-03 DIAGNOSIS — K5900 Constipation, unspecified: Secondary | ICD-10-CM | POA: Diagnosis not present

## 2022-10-03 DIAGNOSIS — R3915 Urgency of urination: Secondary | ICD-10-CM | POA: Diagnosis not present

## 2022-10-07 DIAGNOSIS — K5730 Diverticulosis of large intestine without perforation or abscess without bleeding: Secondary | ICD-10-CM | POA: Diagnosis not present

## 2022-10-07 DIAGNOSIS — F0390 Unspecified dementia without behavioral disturbance: Secondary | ICD-10-CM | POA: Diagnosis not present

## 2022-10-07 DIAGNOSIS — K5790 Diverticulosis of intestine, part unspecified, without perforation or abscess without bleeding: Secondary | ICD-10-CM | POA: Diagnosis not present

## 2022-10-07 DIAGNOSIS — I10 Essential (primary) hypertension: Secondary | ICD-10-CM | POA: Diagnosis not present

## 2022-10-07 DIAGNOSIS — Z79899 Other long term (current) drug therapy: Secondary | ICD-10-CM | POA: Diagnosis not present

## 2022-10-07 DIAGNOSIS — R109 Unspecified abdominal pain: Secondary | ICD-10-CM | POA: Diagnosis not present

## 2022-10-07 DIAGNOSIS — K5732 Diverticulitis of large intestine without perforation or abscess without bleeding: Secondary | ICD-10-CM | POA: Diagnosis not present

## 2022-10-07 DIAGNOSIS — N1330 Unspecified hydronephrosis: Secondary | ICD-10-CM | POA: Diagnosis not present

## 2022-10-07 DIAGNOSIS — R1030 Lower abdominal pain, unspecified: Secondary | ICD-10-CM | POA: Diagnosis not present

## 2022-11-30 DIAGNOSIS — Z79899 Other long term (current) drug therapy: Secondary | ICD-10-CM | POA: Diagnosis not present

## 2022-11-30 DIAGNOSIS — R42 Dizziness and giddiness: Secondary | ICD-10-CM | POA: Diagnosis not present

## 2022-12-03 DIAGNOSIS — K922 Gastrointestinal hemorrhage, unspecified: Secondary | ICD-10-CM | POA: Diagnosis not present

## 2022-12-03 DIAGNOSIS — I10 Essential (primary) hypertension: Secondary | ICD-10-CM | POA: Diagnosis not present

## 2022-12-03 DIAGNOSIS — K5730 Diverticulosis of large intestine without perforation or abscess without bleeding: Secondary | ICD-10-CM | POA: Diagnosis not present

## 2022-12-03 DIAGNOSIS — Z79899 Other long term (current) drug therapy: Secondary | ICD-10-CM | POA: Diagnosis not present

## 2023-01-16 DIAGNOSIS — F01A0 Vascular dementia, mild, without behavioral disturbance, psychotic disturbance, mood disturbance, and anxiety: Secondary | ICD-10-CM | POA: Diagnosis not present

## 2023-01-16 DIAGNOSIS — F0390 Unspecified dementia without behavioral disturbance: Secondary | ICD-10-CM | POA: Diagnosis not present

## 2023-02-28 DIAGNOSIS — F01A0 Vascular dementia, mild, without behavioral disturbance, psychotic disturbance, mood disturbance, and anxiety: Secondary | ICD-10-CM | POA: Diagnosis not present

## 2023-02-28 DIAGNOSIS — F0390 Unspecified dementia without behavioral disturbance: Secondary | ICD-10-CM | POA: Diagnosis not present

## 2023-10-03 DIAGNOSIS — H353131 Nonexudative age-related macular degeneration, bilateral, early dry stage: Secondary | ICD-10-CM | POA: Diagnosis not present
